# Patient Record
Sex: Female | Born: 1940
Health system: Southern US, Community
[De-identification: ages and names within clinical notes are randomized; demographics above are authoritative.]

## PROBLEM LIST (undated history)

## (undated) DIAGNOSIS — E7800 Pure hypercholesterolemia, unspecified: Secondary | ICD-10-CM

## (undated) DIAGNOSIS — M1990 Unspecified osteoarthritis, unspecified site: Secondary | ICD-10-CM

## (undated) DIAGNOSIS — K219 Gastro-esophageal reflux disease without esophagitis: Secondary | ICD-10-CM

## (undated) DIAGNOSIS — Z860100 Personal history of colon polyps, unspecified: Secondary | ICD-10-CM

## (undated) DIAGNOSIS — I10 Essential (primary) hypertension: Secondary | ICD-10-CM

## (undated) DIAGNOSIS — H269 Unspecified cataract: Secondary | ICD-10-CM

## (undated) DIAGNOSIS — M810 Age-related osteoporosis without current pathological fracture: Secondary | ICD-10-CM

## (undated) HISTORY — DX: Unspecified osteoarthritis, unspecified site: M19.90

## (undated) HISTORY — DX: Essential (primary) hypertension: I10

## (undated) HISTORY — DX: Age-related osteoporosis without current pathological fracture: M81.0

## (undated) HISTORY — PX: KNEE ARTHROPLASTY: SHX992

## (undated) HISTORY — DX: Gastro-esophageal reflux disease without esophagitis: K21.9

## (undated) HISTORY — PX: JOINT REPLACEMENT: SHX530

## (undated) HISTORY — DX: Pure hypercholesterolemia, unspecified: E78.00

## (undated) HISTORY — PX: GUM SURGERY: SHX658

## (undated) HISTORY — DX: Personal history of colonic polyps: Z86.010

## (undated) HISTORY — DX: Unspecified cataract: H26.9

## (undated) HISTORY — PX: TUBAL LIGATION: SHX77

## (undated) HISTORY — DX: Personal history of colon polyps, unspecified: Z86.0100

## (undated) HISTORY — PX: CARPAL TUNNEL RELEASE: SHX101

---

## 2004-09-17 ENCOUNTER — Ambulatory Visit (HOSPITAL_COMMUNITY): Admission: RE | Admit: 2004-09-17 | Discharge: 2004-09-17 | Payer: Self-pay | Admitting: Rheumatology

## 2006-07-03 IMAGING — CR DG CHEST 2V
2 series · 2 of 2 positions shown · non-contrast
Comparison: none

CLINICAL DATA: Rheumatoid arthritis.  Question tuberculosis.  
 CHEST - TWO VIEW:
 There are no prior studies available for comparison.   There are no infiltrative or edematous changes.  Minimal linear scarring is seen within the lung bases.  The heart is upper normal in size and there are no mediastinal abnormalities.

[view not recorded (1 of 2)]
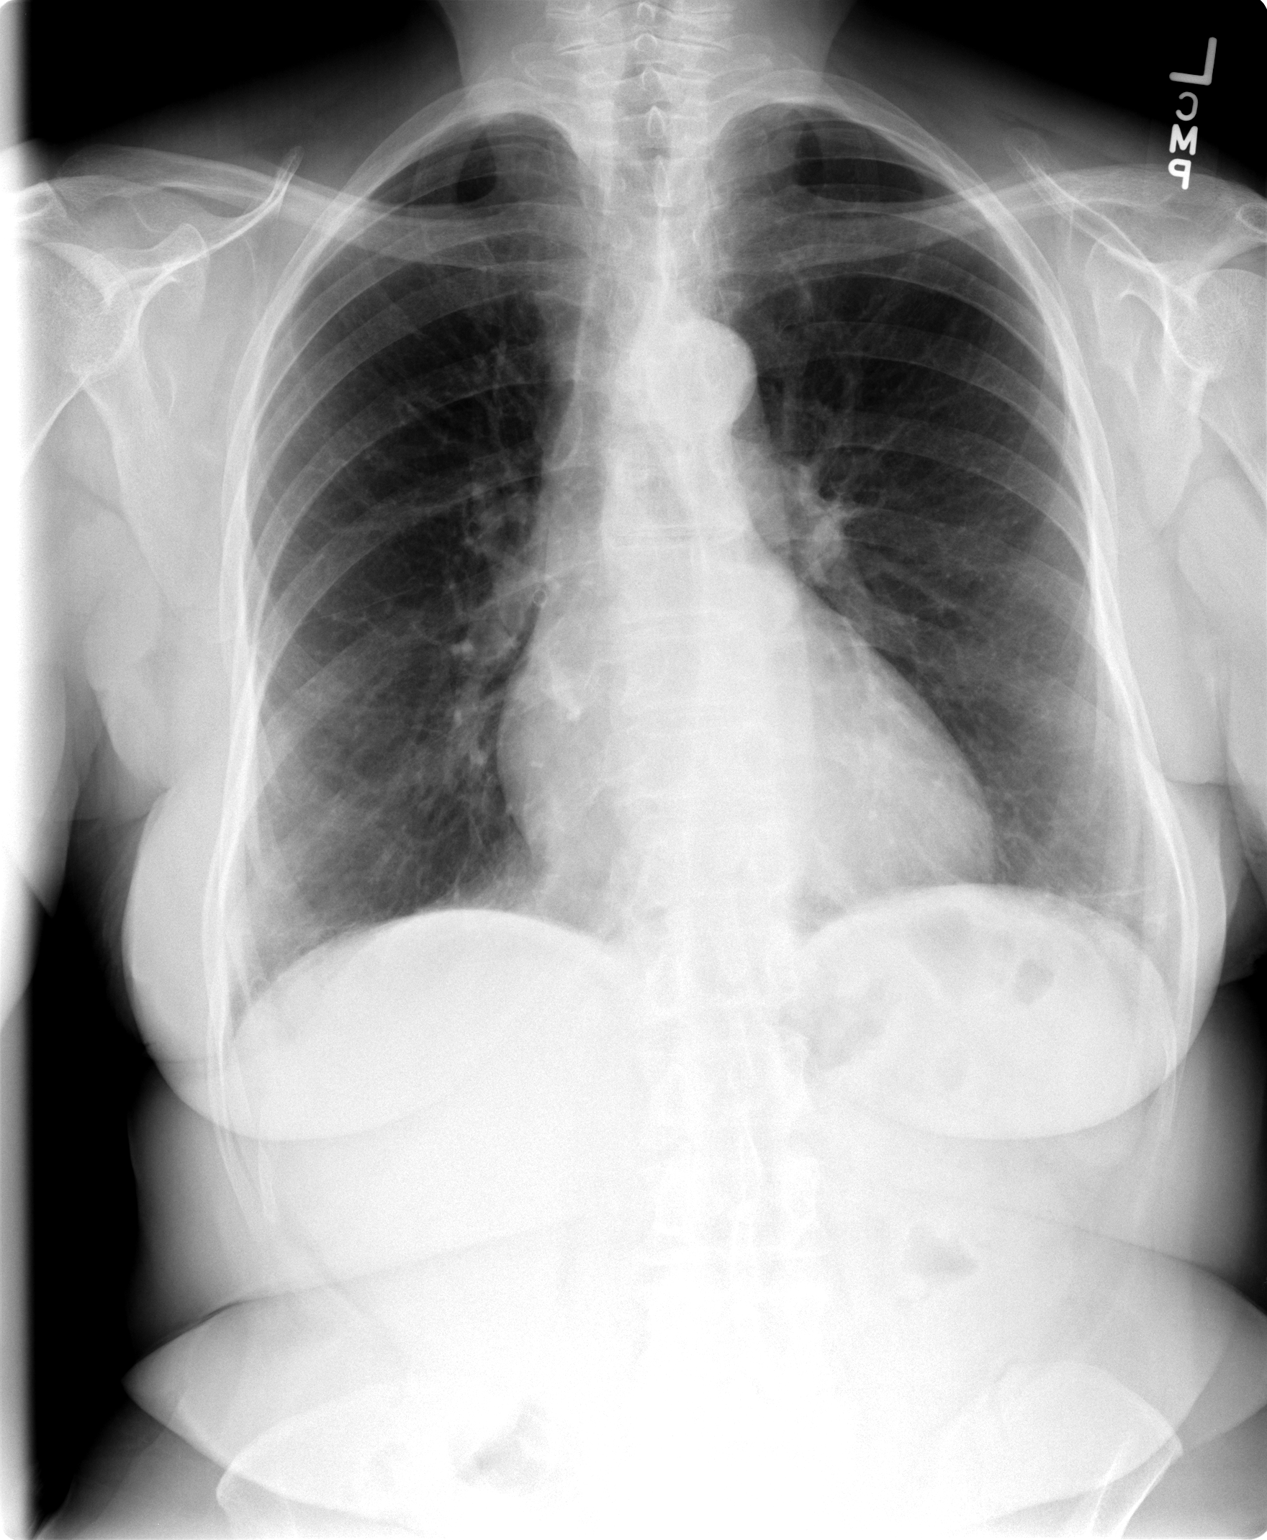

[view not recorded (2 of 2)]
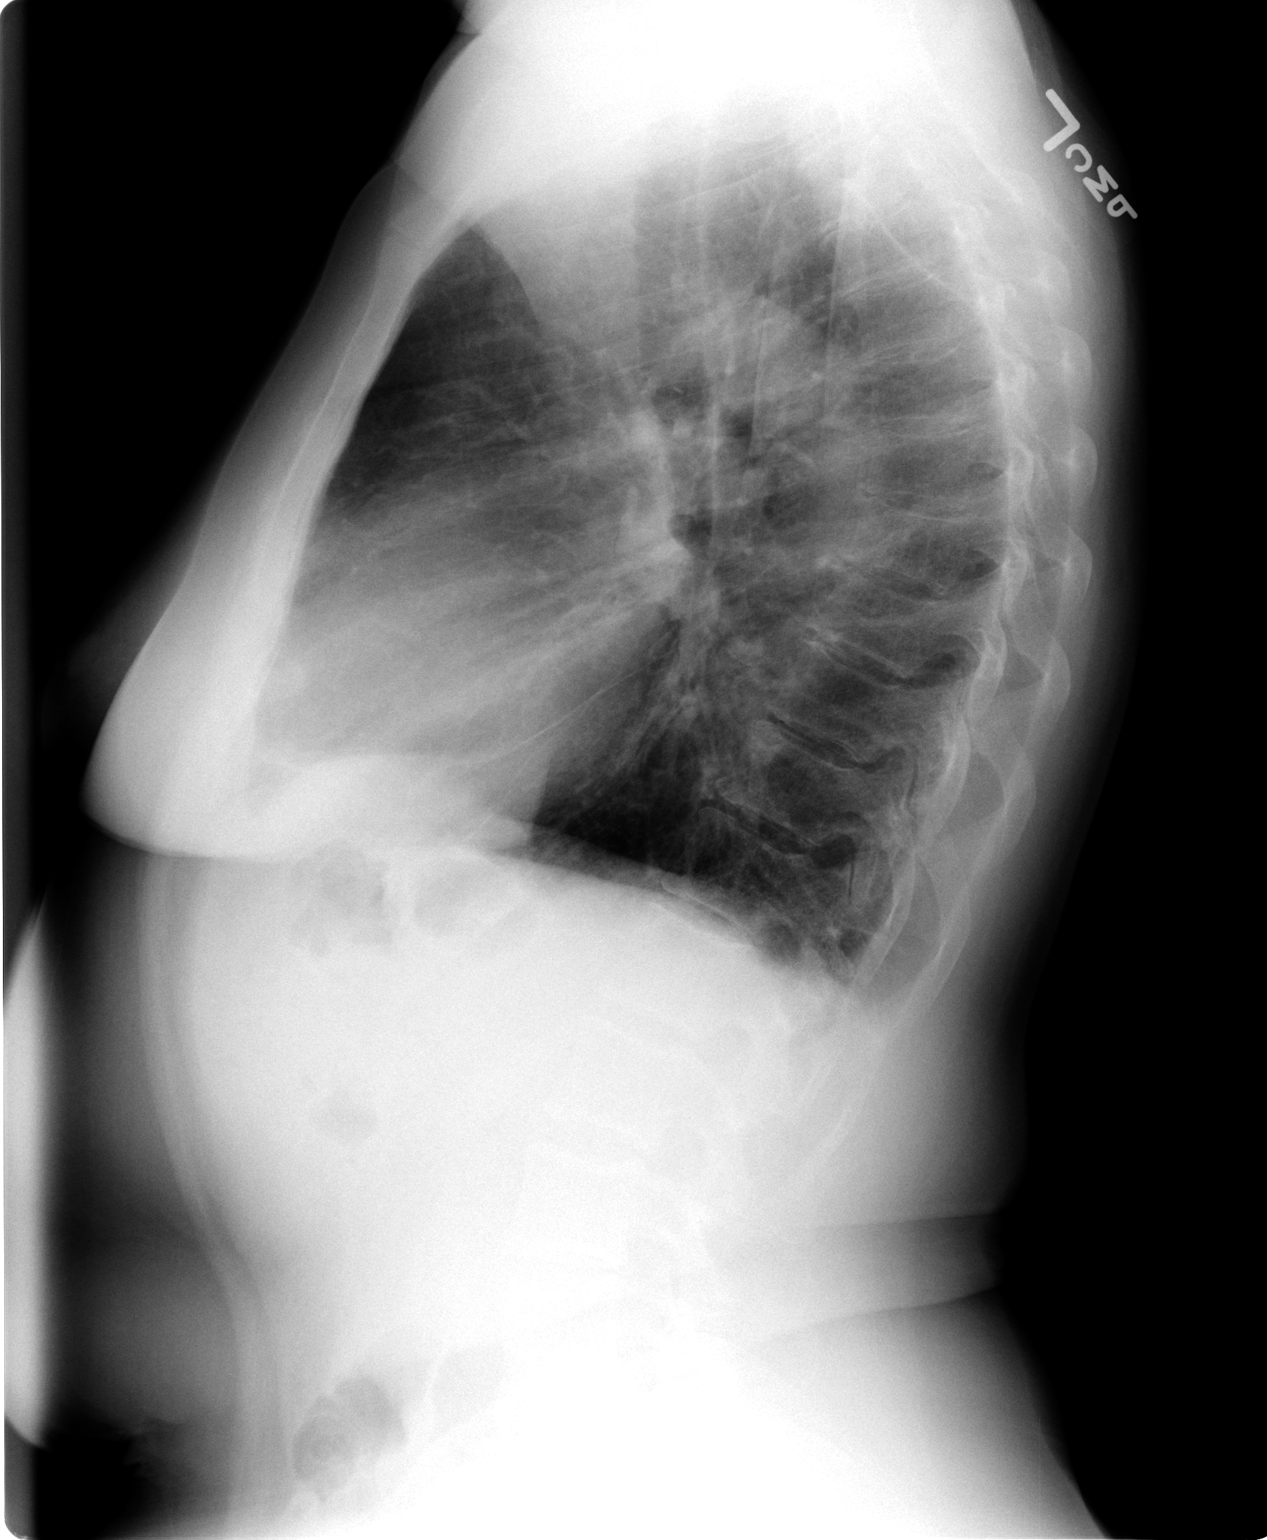

[2 of 2 positions shown; findings below may reference images not displayed]

IMPRESSION: No evidence for active chest disease.

## 2008-03-18 ENCOUNTER — Inpatient Hospital Stay (HOSPITAL_COMMUNITY): Admission: RE | Admit: 2008-03-18 | Discharge: 2008-03-20 | Payer: Self-pay | Admitting: Orthopedic Surgery

## 2010-12-23 HISTORY — PX: COLONOSCOPY: SHX174

## 2011-01-05 NOTE — Op Note (Signed)
NAMEHAYES, REHFELDT NO.:  [id number]   MEDICAL RECORD NO.:  [id number]          PATIENT TYPE:  INP   LOCATION:  4144                         FACILITY:  [facility]   PHYSICIAN:  [name]. Sherlean Foot, M.D. DATE OF BIRTH:  03-07-1941   DATE OF PROCEDURE:  03/18/2008  DATE OF DISCHARGE:                               OPERATIVE REPORT   SURGEON:  [name]. Sherlean Foot, M.D.   ASSISTANTS:  1. [name], PA-C  2. Skip Mayer, PA   ANESTHESIA:  General.   PREOPERATIVE DIAGNOSIS:  Right knee osteoarthritis.   POSTOPERATIVE DIAGNOSIS:  Right knee osteoarthritis.   PROCEDURE:  Right total knee arthroplasty.   INDICATIONS FOR PROCEDURE:  The patient is a 70 year old white female  with failed conservative measures for osteoarthritis of the right knee.  Informed consent was signed.   DESCRIPTION OF PROCEDURE:  The patient was taken to operating room,  administered general anesthesia.  Foley catheter was placed.  The right  leg was prepped and draped in usual sterile fashion.  A midline incision  was made with a #10 blade approximately 6 inches in length.  A new blade  was used to make a median parapatellar arthrotomy and performed a  synovectomy.  I then went into flexion.  I elevated deep MCL off the  medial crest of the tibia.  I used extramedullary alignment system to  make a perpendicular cut to the anatomic axis of the tibia.  Then used  __________ guide system making a 4-degree valgus cut on the distal femur  since it was a valgus knee.  I removed the intramedullary guide.  I  marked out the subcondylar axis and posterior condylar angle measured 3  degrees, sized to a size D.  I pinned the cutting block into 3 degrees  of external rotation.  I made the posterior and anterior chamfer cuts  with the sagittal saw.  I then removed the guide to cut surfaces of the  bone.  I placed a lamina spreader in the knee, removed the ACL, PCL,  medial lateral menisci, and posterior  condylar osteophytes.  I then  placed a 10-mm spacer block in the knee, and had good flexion/extension  gap balance.  Then placed a scoop retractor behind the femur and used  the D finishing block to make the __________, then removed that and  finished the femur with a size 4 tibial tray drilling keel.  I then  trialed with a size 4 tibia, D femur, 10 insert, and 35 patella with a  good flexion/extension gap balance __________ to perform a small  release.  I then removed the trial components and copiously irrigated.  I placed the components and removed all excess cement, allowed cement  harden in extension.  I left the Hemovac coming out superolaterally and  deep the arthrotomy and plain catheter commenced supermedial and  superficial to arthrotomy.  Once the cement was harden, I let the  tourniquet down, this was at 50 minutes.  I obtained hemostasis.  Wound  copiously irrigated.  I closed the arthrotomy with figure-of-eight #1  Vicryl  sutures and deep soft tissues buried with 0 Vicryl sutures,  subcuticular 2-0 Vicryl, and skin staples.  Dressed with Xeroform  dressing sponges, sterile Webril, and TED stocking.   COMPLICATIONS:  None.   DRAINS:  One Hemovac and one plain catheter.   ESTIMATED BLOOD LOSS:  300 mL.   TOURNIQUET TIME:  52 minutes.           ______________________________  [name] Sherlean Foot, M.D.     SDL/MEDQ  D:  03/18/2008  T:  03/19/2008  Job:  [id number]

## 2011-05-07 ENCOUNTER — Encounter (HOSPITAL_COMMUNITY)
Admission: RE | Admit: 2011-05-07 | Discharge: 2011-05-07 | Disposition: A | Discharge status: Home / Self Care | Payer: Medicare Other | Source: Ambulatory Visit | Attending: Orthopedic Surgery | Admitting: Orthopedic Surgery

## 2011-05-07 LAB — URINALYSIS, ROUTINE W REFLEX MICROSCOPIC
Glucose, UA: NEGATIVE mg/dL
Hgb urine dipstick: NEGATIVE
Ketones, ur: NEGATIVE mg/dL
pH: 6 (ref 5.0–8.0)

## 2011-05-07 LAB — DIFFERENTIAL
Basophils Relative: 1 % (ref 0–1)
Eosinophils Absolute: 0.5 10*3/uL (ref 0.0–0.7)
Lymphs Abs: 1.7 10*3/uL (ref 0.7–4.0)
Monocytes Absolute: 0.5 10*3/uL (ref 0.1–1.0)
Monocytes Relative: 11 % (ref 3–12)
Neutrophils Relative %: 41 % — ABNORMAL LOW (ref 43–77)

## 2011-05-07 LAB — URINE MICROSCOPIC-ADD ON

## 2011-05-07 LAB — COMPREHENSIVE METABOLIC PANEL
ALT: 12 U/L (ref 0–35)
AST: 17 U/L (ref 0–37)
Albumin: 3.7 g/dL (ref 3.5–5.2)
Calcium: 9.8 mg/dL (ref 8.4–10.5)
Creatinine, Ser: 0.78 mg/dL (ref 0.50–1.10)
Sodium: 141 mEq/L (ref 135–145)

## 2011-05-07 LAB — PROTIME-INR: Prothrombin Time: 13.9 seconds (ref 11.6–15.2)

## 2011-05-07 LAB — CBC
MCH: 32.6 pg (ref 26.0–34.0)
MCHC: 35.1 g/dL (ref 30.0–36.0)
MCV: 92.8 fL (ref 78.0–100.0)
Platelets: 226 10*3/uL (ref 150–400)
RBC: 3.77 MIL/uL — ABNORMAL LOW (ref 3.87–5.11)

## 2011-05-07 LAB — SURGICAL PCR SCREEN: Staphylococcus aureus: NEGATIVE

## 2011-05-08 LAB — URINE CULTURE
Colony Count: 6000
Culture  Setup Time: 201209141134

## 2011-05-17 ENCOUNTER — Inpatient Hospital Stay (HOSPITAL_COMMUNITY)
Admission: RE | Admit: 2011-05-17 | Discharge: 2011-05-21 | DRG: 470 | Disposition: A | Discharge status: Skilled Nursing Facility | Payer: Medicare Other | Source: Ambulatory Visit | Attending: Orthopedic Surgery | Admitting: Orthopedic Surgery

## 2011-05-17 DIAGNOSIS — Z79899 Other long term (current) drug therapy: Secondary | ICD-10-CM

## 2011-05-17 DIAGNOSIS — D62 Acute posthemorrhagic anemia: Secondary | ICD-10-CM | POA: Diagnosis not present

## 2011-05-17 DIAGNOSIS — I10 Essential (primary) hypertension: Secondary | ICD-10-CM | POA: Diagnosis present

## 2011-05-17 DIAGNOSIS — M1710 Unilateral primary osteoarthritis, unspecified knee: Principal | ICD-10-CM | POA: Diagnosis present

## 2011-05-17 DIAGNOSIS — K219 Gastro-esophageal reflux disease without esophagitis: Secondary | ICD-10-CM | POA: Diagnosis present

## 2011-05-17 DIAGNOSIS — Z7901 Long term (current) use of anticoagulants: Secondary | ICD-10-CM

## 2011-05-17 DIAGNOSIS — Z01812 Encounter for preprocedural laboratory examination: Secondary | ICD-10-CM

## 2011-05-18 LAB — CBC
HCT: 25.3 % — ABNORMAL LOW (ref 36.0–46.0)
Hemoglobin: 8.7 g/dL — ABNORMAL LOW (ref 12.0–15.0)
MCV: 92.1 fL (ref 78.0–100.0)
MCV: 92.7 fL (ref 78.0–100.0)
Platelets: 195 10*3/uL (ref 150–400)
RBC: 2.77 MIL/uL — ABNORMAL LOW (ref 3.87–5.11)
RBC: 2.73 MIL/uL — ABNORMAL LOW (ref 3.87–5.11)
RDW: 13.5 % (ref 11.5–15.5)
WBC: 5.7 10*3/uL (ref 4.0–10.5)
WBC: 6.9 10*3/uL (ref 4.0–10.5)

## 2011-05-18 LAB — BASIC METABOLIC PANEL
CO2: 27 mEq/L (ref 19–32)
Chloride: 102 mEq/L (ref 96–112)
Creatinine, Ser: 0.82 mg/dL (ref 0.50–1.10)
Glucose, Bld: 111 mg/dL — ABNORMAL HIGH (ref 70–99)

## 2011-05-19 LAB — BASIC METABOLIC PANEL
BUN: 10 mg/dL (ref 6–23)
Calcium: 7.7 mg/dL — ABNORMAL LOW (ref 8.4–10.5)
GFR calc non Af Amer: >60 mL/min (ref >60)
Glucose, Bld: 94 mg/dL (ref 70–99)

## 2011-05-19 LAB — CBC
HCT: 20.9 % — ABNORMAL LOW (ref 36.0–46.0)
Hemoglobin: 7.1 g/dL — ABNORMAL LOW (ref 12.0–15.0)
MCH: 31.8 pg (ref 26.0–34.0)
MCHC: 34 g/dL (ref 30.0–36.0)

## 2011-05-20 LAB — BASIC METABOLIC PANEL
CO2: 27 mEq/L (ref 19–32)
Calcium: 8.2 mg/dL — ABNORMAL LOW (ref 8.4–10.5)
Creatinine, Ser: 0.6 mg/dL (ref 0.50–1.10)
GFR calc Af Amer: >60 mL/min (ref >60)

## 2011-05-20 LAB — CBC
MCH: 31.4 pg (ref 26.0–34.0)
MCV: 91.1 fL (ref 78.0–100.0)
Platelets: 163 10*3/uL (ref 150–400)
RDW: 14.7 % (ref 11.5–15.5)

## 2011-05-20 LAB — TYPE AND SCREEN
Antibody Screen: NEGATIVE
Unit division: 0

## 2011-05-21 LAB — URINALYSIS, ROUTINE W REFLEX MICROSCOPIC
Glucose, UA: NEGATIVE
Nitrite: NEGATIVE
Specific Gravity, Urine: 1.02
pH: 6

## 2011-05-21 LAB — CROSSMATCH

## 2011-05-21 LAB — BASIC METABOLIC PANEL
BUN: 7
BUN: 7
CO2: 31
Calcium: 8.8
Chloride: 100
Creatinine, Ser: 0.87
Creatinine, Ser: 0.92
GFR calc Af Amer: >60
Glucose, Bld: 144 — ABNORMAL HIGH

## 2011-05-21 LAB — CBC
MCHC: 33.5
MCHC: 33.8
MCV: 98
MCV: 98.2
Platelets: 184
Platelets: 192
Platelets: 227
RBC: 3.16 — ABNORMAL LOW
WBC: 5.5
WBC: 7.2

## 2011-05-21 LAB — DIFFERENTIAL
Basophils Absolute: 0
Eosinophils Relative: 9 — ABNORMAL HIGH
Lymphocytes Relative: 24
Lymphs Abs: 1.3
Monocytes Absolute: 0.4
Monocytes Relative: 8

## 2011-05-21 LAB — COMPREHENSIVE METABOLIC PANEL
AST: 17
Albumin: 3.7
Chloride: 103
Creatinine, Ser: 0.98
GFR calc Af Amer: >60
Total Bilirubin: 0.8
Total Protein: 6.8

## 2011-05-21 LAB — URINE MICROSCOPIC-ADD ON

## 2011-05-30 NOTE — Op Note (Signed)
NAMESHERLINE, EBERWEIN NO.:  [id number]  MEDICAL RECORD NO.:  [id number]  LOCATION:  5005                         FACILITY:  [facility]  PHYSICIAN:  [name]. Andrea [name], M.D. DATE OF BIRTH:  [birth year]  DATE OF PROCEDURE:  05/17/2011 DATE OF DISCHARGE:  05/21/2011                              OPERATIVE REPORT   SURGEON:  [name]. Andrea Foot, MD.  ASSISTANT:  [name], PA-C.  ANESTHESIA:  General.  PREOPERATIVE DIAGNOSIS:  Left knee osteoarthritis.  POSTOPERATIVE DIAGNOSIS:  Left knee osteoarthritis.  PROCEDURE:  Left total knee arthroplasty.  INDICATION FOR PROCEDURE:  The patient is a 70 year old black female with failure of conservative measures for osteoarthritis of the left knee.  Informed consent obtained.  DESCRIPTION OF PROCEDURE:  The patient was laid supine under general anesthesia.  Left leg prepped and draped in usual sterile fashion.  The extremity was exsanguinated with the Esmarch, tourniquet inflated to 350 mmHg.  A midline incision was made with a #10 blade.  New blade was used to make a median parapatellar arthrotomy and performed synovectomy.  I had elevated the deep MCL off the medial crest of the tibia, but not down through the pes since this was a valgus knee.  I then everted the patella, measured 22 mm thick.  I reamed down 8.5 mm and drilled through lug holes through the 32-mm template.  I then subluxed the patella laterally into flexion.  I used extramedullary alignment system on the tibia to make a perpendicular cut and 3 degrees posterior slope.  I used the intramedullary system set on 4 degrees since this was a valgus knee. I made the distal femoral cut.  I marked out the epicondylar axis, posterior condylar angle measured 3 degrees.  Then sized to a size D, pinned through 3 degrees external rotation holes, made the anterior, posterior, and chamfer cut with a sagittal saw.  I then placed a lamina spreader in the knee and removed  the ACL, PCL, medial and lateral menisci.  I removed the posterior condylar osteophytes as well.  I then placed a 12-mm spacer block in the knee.  I had good flexion/extension gap balance.  I then finished for a size D femur and a 4 tibia.  I then trialed with D femur, 4 tibia, 12 insert, 32 patella.  I had good flexion/extension gap balance, good patellar tracking and chose these components.  I removed the components, copiously irrigated.  I then cemented in the components, removed all excess cement, allowed cement to harden in extension.  Left Hemovac coming out superolaterally in deep arthrotomy, pain catheter coming out supermedially in the superficial layer of arthrotomy.  I then let down the tourniquet and obtained hemostasis at 46 minutes.  I then closed the arthrotomy with figure-of- eight #1 normal Vicryl sutures, buried 0 Vicryl sutures, subcuticular Dermabond, stitches and skin staples.  Dressed with Xeroform, dressing sponges, sterile Webril, and TED stocking.  COMPLICATIONS:  None.  DRAINS:  One Hemovac, one pain catheter.  ESTIMATED BLOOD LOSS:  300 mL.          ______________________________ [name]. Andrea [name], M.D.     SDL/MEDQ  D:  05/23/2011  T:  05/23/2011  Job:  [id number]  Electronically Signed by Georgena Spurling M.D. on 05/30/2011 11:22:55 AM

## 2011-06-22 NOTE — Discharge Summary (Signed)
[name], [name] NO.:  [id number]  MEDICAL RECORD NO.:  [id number]  LOCATION:  5005                         FACILITY:  [facility]  PHYSICIAN:  [name]. Sherlean Foot, M.D. DATE OF BIRTH:  28-Aug-1940  DATE OF ADMISSION:  05/17/2011 DATE OF DISCHARGE:                              DISCHARGE SUMMARY   ADMISSION DIAGNOSIS:  Osteoarthritis of the left knee.  DISCHARGE DIAGNOSES: 1. Osteoarthritis of the left knee. 2. Status post left total knee arthroplasty. 3. Acute blood loss anemia, status post surgery.  PROCEDURE:  Left total knee arthroplasty.  HISTORY:  The patient is a 70 year old female who complained of pain in the left knee times several years, now interfering with activities of daily living.  Conservative treatment has failed.  Risks and benefits of surgery were discussed with the patient.  The patient was cleared for surgery.  ALLERGIES:  The patient has no known drug allergies.MEDICATIONS:  Upon admission to the hospital the patient was taking; 1. Acetaminophen 500 mg 2 tablets every 4 hours as needed. 2. Active ultra-strength over-the-counter topically as needed. 3. Enbrel 50 mg subcutaneously weekly, she is held for the procedure. 4. Folic acid 1 mg 1 tablet by mouth twice daily held for procedure. 5. Fosamax 70 mg 1 tablet by mouth weekly. 6. Methotrexate 10 mg subcutaneously weekly. 7. Prilosec 20 mg daily 12% over-the-counter topically daily as     needed. 8. Triamterene/HCTZ 37.5/25 mg 1 tablet daily.  HOSPITAL COURSE:  This is a 70 year old female admitted on May 17, 2011, after appropriate laboratory studies were obtained preoperatively as well as Ancef when called to the operating room.  She was taken to the OR where she underwent a left total knee arthroplasty.  She tolerated the procedure well and was taken to the PACU in good condition.  She was placed on p.o. pain medication as well as Dilaudid IV as needed.  Foley was placed  intraoperatively.  Postop day #1, vital signs are stable.  The patient denied chest pain, shortness breath or calf pain, was started on Lovenox 30 mg subcu every 2 hours at 8:00 a.m.  Consults PT, OT and Care Management were made. The patient was weightbearing as tolerated.  CPM of 0-9 degrees for 6 to 8 hours a day.  Incentive spirometry was taught.  Postop day #2, the patient continued to progress with physical therapy. Dressing was changed.  Marcaine pump was discontinued as well as the Hemovac.  Foley was discontinued.  The patient was continued on p.o. pain medications.  The patient's blood level was low dropped down to 7.1 hemoglobin and was given 2 units of blood.  Postop day #3, the patient continued to progress with physical therapy, blood levels were better and the patient was transferred to SNF.  Wound bed was available.  DISCHARGE INSTRUCTIONS:  There are no restrictions to diet.  Follow the instruction sheet for wound care.  She may shower on Friday.  Increase activity slowly.  May use a cane or walker, weightbearing as tolerated. No lifting or driving for 6 weeks.  Home health status has been established post skilled nursing facility.  Upon discharge from hospital, the patient was given  prescriptions for; 1. Lovenox 40 mg inject 1 subcutaneously daily for 2 weeks. 2. Robaxin 500 mg 1-2 tabs every 6 to 8 hours as needed for spasm. 3. Oxycodone 5 mg 1 to 2 tablets every 4 to 6 hours as needed for     pain. 4. OxyContin 20 mg one twice a day.  The patient will follow with Dr. Sherlean Foot on June 01, 2011.  She is to call for an appointment to confirm at [phone], discharged in improved condition.  The patient's appointment will be in [facility] 8:20 on Tuesday morning.    ______________________________ [name], PA-C   ______________________________ [name]. Sherlean Foot, M.D.    MJ/MEDQ  D:  05/20/2011  T:  05/20/2011  Job:  [id number]  Electronically Signed by  [name] PA-C on 06/16/2011 01:54:59 PM Electronically Signed by Georgena Spurling M.D. on 06/22/2011 05:26:43 PM

## 2011-09-03 DIAGNOSIS — Z79899 Other long term (current) drug therapy: Secondary | ICD-10-CM | POA: Diagnosis not present

## 2011-09-03 DIAGNOSIS — M2550 Pain in unspecified joint: Secondary | ICD-10-CM | POA: Diagnosis not present

## 2011-09-09 DIAGNOSIS — M810 Age-related osteoporosis without current pathological fracture: Secondary | ICD-10-CM | POA: Diagnosis not present

## 2011-09-16 ENCOUNTER — Encounter (HOSPITAL_COMMUNITY)
Admission: RE | Admit: 2011-09-16 | Discharge: 2011-09-16 | Disposition: A | Discharge status: Home / Self Care | Payer: Medicare Other | Source: Ambulatory Visit | Attending: Rheumatology | Admitting: Rheumatology

## 2011-09-16 DIAGNOSIS — M810 Age-related osteoporosis without current pathological fracture: Secondary | ICD-10-CM | POA: Insufficient documentation

## 2011-09-16 MED ORDER — ZOLEDRONIC ACID 5 MG/100ML IV SOLN
5.0000 mg | Freq: Once | INTRAVENOUS | Status: AC
  Administered 2011-09-16: 5 mg via INTRAVENOUS
  Filled 2011-09-16: qty 100

## 2011-10-14 DIAGNOSIS — Z0000 Encounter for general adult medical examination without abnormal findings: Secondary | ICD-10-CM | POA: Diagnosis not present

## 2011-10-25 DIAGNOSIS — M25569 Pain in unspecified knee: Secondary | ICD-10-CM | POA: Diagnosis not present

## 2011-10-28 DIAGNOSIS — M810 Age-related osteoporosis without current pathological fracture: Secondary | ICD-10-CM | POA: Diagnosis not present

## 2011-10-28 DIAGNOSIS — Z7189 Other specified counseling: Secondary | ICD-10-CM | POA: Diagnosis not present

## 2011-10-28 DIAGNOSIS — Z1389 Encounter for screening for other disorder: Secondary | ICD-10-CM | POA: Diagnosis not present

## 2011-10-28 DIAGNOSIS — Z6825 Body mass index (BMI) 25.0-25.9, adult: Secondary | ICD-10-CM | POA: Diagnosis not present

## 2011-11-11 DIAGNOSIS — H35359 Cystoid macular degeneration, unspecified eye: Secondary | ICD-10-CM | POA: Diagnosis not present

## 2011-12-07 DIAGNOSIS — M25569 Pain in unspecified knee: Secondary | ICD-10-CM | POA: Diagnosis not present

## 2011-12-07 DIAGNOSIS — M1710 Unilateral primary osteoarthritis, unspecified knee: Secondary | ICD-10-CM | POA: Diagnosis not present

## 2011-12-07 DIAGNOSIS — M810 Age-related osteoporosis without current pathological fracture: Secondary | ICD-10-CM | POA: Diagnosis not present

## 2011-12-07 DIAGNOSIS — Z79899 Other long term (current) drug therapy: Secondary | ICD-10-CM | POA: Diagnosis not present

## 2011-12-07 DIAGNOSIS — M069 Rheumatoid arthritis, unspecified: Secondary | ICD-10-CM | POA: Diagnosis not present

## 2011-12-07 DIAGNOSIS — R7612 Nonspecific reaction to cell mediated immunity measurement of gamma interferon antigen response without active tuberculosis: Secondary | ICD-10-CM | POA: Diagnosis not present

## 2012-01-25 DIAGNOSIS — K219 Gastro-esophageal reflux disease without esophagitis: Secondary | ICD-10-CM | POA: Diagnosis not present

## 2012-03-03 DIAGNOSIS — Z79899 Other long term (current) drug therapy: Secondary | ICD-10-CM | POA: Diagnosis not present

## 2012-03-22 DIAGNOSIS — IMO0001 Reserved for inherently not codable concepts without codable children: Secondary | ICD-10-CM | POA: Diagnosis not present

## 2012-03-22 DIAGNOSIS — M5137 Other intervertebral disc degeneration, lumbosacral region: Secondary | ICD-10-CM | POA: Diagnosis not present

## 2012-03-22 DIAGNOSIS — M999 Biomechanical lesion, unspecified: Secondary | ICD-10-CM | POA: Diagnosis not present

## 2012-03-27 DIAGNOSIS — M999 Biomechanical lesion, unspecified: Secondary | ICD-10-CM | POA: Diagnosis not present

## 2012-03-27 DIAGNOSIS — IMO0001 Reserved for inherently not codable concepts without codable children: Secondary | ICD-10-CM | POA: Diagnosis not present

## 2012-03-27 DIAGNOSIS — M5137 Other intervertebral disc degeneration, lumbosacral region: Secondary | ICD-10-CM | POA: Diagnosis not present

## 2012-03-29 DIAGNOSIS — IMO0001 Reserved for inherently not codable concepts without codable children: Secondary | ICD-10-CM | POA: Diagnosis not present

## 2012-03-29 DIAGNOSIS — M999 Biomechanical lesion, unspecified: Secondary | ICD-10-CM | POA: Diagnosis not present

## 2012-03-29 DIAGNOSIS — M5137 Other intervertebral disc degeneration, lumbosacral region: Secondary | ICD-10-CM | POA: Diagnosis not present

## 2012-03-31 DIAGNOSIS — M5137 Other intervertebral disc degeneration, lumbosacral region: Secondary | ICD-10-CM | POA: Diagnosis not present

## 2012-03-31 DIAGNOSIS — M999 Biomechanical lesion, unspecified: Secondary | ICD-10-CM | POA: Diagnosis not present

## 2012-03-31 DIAGNOSIS — IMO0001 Reserved for inherently not codable concepts without codable children: Secondary | ICD-10-CM | POA: Diagnosis not present

## 2012-04-03 DIAGNOSIS — M5137 Other intervertebral disc degeneration, lumbosacral region: Secondary | ICD-10-CM | POA: Diagnosis not present

## 2012-04-03 DIAGNOSIS — IMO0001 Reserved for inherently not codable concepts without codable children: Secondary | ICD-10-CM | POA: Diagnosis not present

## 2012-04-03 DIAGNOSIS — M999 Biomechanical lesion, unspecified: Secondary | ICD-10-CM | POA: Diagnosis not present

## 2012-04-05 DIAGNOSIS — M999 Biomechanical lesion, unspecified: Secondary | ICD-10-CM | POA: Diagnosis not present

## 2012-04-05 DIAGNOSIS — M5137 Other intervertebral disc degeneration, lumbosacral region: Secondary | ICD-10-CM | POA: Diagnosis not present

## 2012-04-05 DIAGNOSIS — IMO0001 Reserved for inherently not codable concepts without codable children: Secondary | ICD-10-CM | POA: Diagnosis not present

## 2012-04-07 DIAGNOSIS — M999 Biomechanical lesion, unspecified: Secondary | ICD-10-CM | POA: Diagnosis not present

## 2012-04-07 DIAGNOSIS — IMO0001 Reserved for inherently not codable concepts without codable children: Secondary | ICD-10-CM | POA: Diagnosis not present

## 2012-04-07 DIAGNOSIS — M5137 Other intervertebral disc degeneration, lumbosacral region: Secondary | ICD-10-CM | POA: Diagnosis not present

## 2012-04-10 DIAGNOSIS — M999 Biomechanical lesion, unspecified: Secondary | ICD-10-CM | POA: Diagnosis not present

## 2012-04-10 DIAGNOSIS — M5137 Other intervertebral disc degeneration, lumbosacral region: Secondary | ICD-10-CM | POA: Diagnosis not present

## 2012-04-10 DIAGNOSIS — M1710 Unilateral primary osteoarthritis, unspecified knee: Secondary | ICD-10-CM | POA: Diagnosis not present

## 2012-04-10 DIAGNOSIS — IMO0001 Reserved for inherently not codable concepts without codable children: Secondary | ICD-10-CM | POA: Diagnosis not present

## 2012-04-13 DIAGNOSIS — M999 Biomechanical lesion, unspecified: Secondary | ICD-10-CM | POA: Diagnosis not present

## 2012-04-13 DIAGNOSIS — M5137 Other intervertebral disc degeneration, lumbosacral region: Secondary | ICD-10-CM | POA: Diagnosis not present

## 2012-04-13 DIAGNOSIS — IMO0001 Reserved for inherently not codable concepts without codable children: Secondary | ICD-10-CM | POA: Diagnosis not present

## 2012-04-17 DIAGNOSIS — IMO0001 Reserved for inherently not codable concepts without codable children: Secondary | ICD-10-CM | POA: Diagnosis not present

## 2012-04-17 DIAGNOSIS — M999 Biomechanical lesion, unspecified: Secondary | ICD-10-CM | POA: Diagnosis not present

## 2012-04-17 DIAGNOSIS — M5137 Other intervertebral disc degeneration, lumbosacral region: Secondary | ICD-10-CM | POA: Diagnosis not present

## 2012-04-19 DIAGNOSIS — M999 Biomechanical lesion, unspecified: Secondary | ICD-10-CM | POA: Diagnosis not present

## 2012-04-19 DIAGNOSIS — M5137 Other intervertebral disc degeneration, lumbosacral region: Secondary | ICD-10-CM | POA: Diagnosis not present

## 2012-04-19 DIAGNOSIS — IMO0001 Reserved for inherently not codable concepts without codable children: Secondary | ICD-10-CM | POA: Diagnosis not present

## 2012-04-25 DIAGNOSIS — Z1231 Encounter for screening mammogram for malignant neoplasm of breast: Secondary | ICD-10-CM | POA: Diagnosis not present

## 2012-04-28 DIAGNOSIS — R928 Other abnormal and inconclusive findings on diagnostic imaging of breast: Secondary | ICD-10-CM | POA: Diagnosis not present

## 2012-05-09 DIAGNOSIS — Z79899 Other long term (current) drug therapy: Secondary | ICD-10-CM | POA: Diagnosis not present

## 2012-05-09 DIAGNOSIS — M2550 Pain in unspecified joint: Secondary | ICD-10-CM | POA: Diagnosis not present

## 2012-05-11 DIAGNOSIS — M069 Rheumatoid arthritis, unspecified: Secondary | ICD-10-CM | POA: Diagnosis not present

## 2012-06-13 DIAGNOSIS — N643 Galactorrhea not associated with childbirth: Secondary | ICD-10-CM | POA: Diagnosis not present

## 2012-06-13 DIAGNOSIS — Z23 Encounter for immunization: Secondary | ICD-10-CM | POA: Diagnosis not present

## 2012-06-27 DIAGNOSIS — M546 Pain in thoracic spine: Secondary | ICD-10-CM | POA: Diagnosis not present

## 2012-06-27 DIAGNOSIS — M6281 Muscle weakness (generalized): Secondary | ICD-10-CM | POA: Diagnosis not present

## 2012-06-27 DIAGNOSIS — S239XXA Sprain of unspecified parts of thorax, initial encounter: Secondary | ICD-10-CM | POA: Diagnosis not present

## 2012-06-29 DIAGNOSIS — M546 Pain in thoracic spine: Secondary | ICD-10-CM | POA: Diagnosis not present

## 2012-06-29 DIAGNOSIS — S239XXA Sprain of unspecified parts of thorax, initial encounter: Secondary | ICD-10-CM | POA: Diagnosis not present

## 2012-06-29 DIAGNOSIS — M6281 Muscle weakness (generalized): Secondary | ICD-10-CM | POA: Diagnosis not present

## 2012-07-04 DIAGNOSIS — M546 Pain in thoracic spine: Secondary | ICD-10-CM | POA: Diagnosis not present

## 2012-07-04 DIAGNOSIS — S239XXA Sprain of unspecified parts of thorax, initial encounter: Secondary | ICD-10-CM | POA: Diagnosis not present

## 2012-07-04 DIAGNOSIS — M6281 Muscle weakness (generalized): Secondary | ICD-10-CM | POA: Diagnosis not present

## 2012-07-06 DIAGNOSIS — S239XXA Sprain of unspecified parts of thorax, initial encounter: Secondary | ICD-10-CM | POA: Diagnosis not present

## 2012-07-06 DIAGNOSIS — M6281 Muscle weakness (generalized): Secondary | ICD-10-CM | POA: Diagnosis not present

## 2012-07-06 DIAGNOSIS — M546 Pain in thoracic spine: Secondary | ICD-10-CM | POA: Diagnosis not present

## 2012-07-11 DIAGNOSIS — M546 Pain in thoracic spine: Secondary | ICD-10-CM | POA: Diagnosis not present

## 2012-07-11 DIAGNOSIS — M6281 Muscle weakness (generalized): Secondary | ICD-10-CM | POA: Diagnosis not present

## 2012-07-11 DIAGNOSIS — S239XXA Sprain of unspecified parts of thorax, initial encounter: Secondary | ICD-10-CM | POA: Diagnosis not present

## 2012-07-13 DIAGNOSIS — S239XXA Sprain of unspecified parts of thorax, initial encounter: Secondary | ICD-10-CM | POA: Diagnosis not present

## 2012-07-13 DIAGNOSIS — M546 Pain in thoracic spine: Secondary | ICD-10-CM | POA: Diagnosis not present

## 2012-07-13 DIAGNOSIS — M6281 Muscle weakness (generalized): Secondary | ICD-10-CM | POA: Diagnosis not present

## 2012-07-19 DIAGNOSIS — M6281 Muscle weakness (generalized): Secondary | ICD-10-CM | POA: Diagnosis not present

## 2012-07-19 DIAGNOSIS — S239XXA Sprain of unspecified parts of thorax, initial encounter: Secondary | ICD-10-CM | POA: Diagnosis not present

## 2012-07-19 DIAGNOSIS — M546 Pain in thoracic spine: Secondary | ICD-10-CM | POA: Diagnosis not present

## 2012-08-02 DIAGNOSIS — M810 Age-related osteoporosis without current pathological fracture: Secondary | ICD-10-CM | POA: Diagnosis not present

## 2012-08-30 DIAGNOSIS — Z79899 Other long term (current) drug therapy: Secondary | ICD-10-CM | POA: Diagnosis not present

## 2012-10-11 DIAGNOSIS — Z09 Encounter for follow-up examination after completed treatment for conditions other than malignant neoplasm: Secondary | ICD-10-CM | POA: Diagnosis not present

## 2012-10-11 DIAGNOSIS — Z96659 Presence of unspecified artificial knee joint: Secondary | ICD-10-CM | POA: Diagnosis not present

## 2012-10-11 DIAGNOSIS — M069 Rheumatoid arthritis, unspecified: Secondary | ICD-10-CM | POA: Diagnosis not present

## 2012-10-11 DIAGNOSIS — M810 Age-related osteoporosis without current pathological fracture: Secondary | ICD-10-CM | POA: Diagnosis not present

## 2012-11-08 DIAGNOSIS — Z79899 Other long term (current) drug therapy: Secondary | ICD-10-CM | POA: Diagnosis not present

## 2012-11-08 DIAGNOSIS — Z09 Encounter for follow-up examination after completed treatment for conditions other than malignant neoplasm: Secondary | ICD-10-CM | POA: Diagnosis not present

## 2012-11-08 DIAGNOSIS — R920 Mammographic microcalcification found on diagnostic imaging of breast: Secondary | ICD-10-CM | POA: Diagnosis not present

## 2012-11-08 DIAGNOSIS — M069 Rheumatoid arthritis, unspecified: Secondary | ICD-10-CM | POA: Diagnosis not present

## 2012-11-14 DIAGNOSIS — Z79899 Other long term (current) drug therapy: Secondary | ICD-10-CM | POA: Diagnosis not present

## 2012-11-27 ENCOUNTER — Other Ambulatory Visit (HOSPITAL_COMMUNITY): Payer: Self-pay | Admitting: *Deleted

## 2012-11-28 ENCOUNTER — Ambulatory Visit (HOSPITAL_COMMUNITY)
Admission: RE | Admit: 2012-11-28 | Discharge: 2012-11-28 | Disposition: A | Discharge status: Home / Self Care | Payer: Medicare Other | Source: Ambulatory Visit | Attending: Rheumatology | Admitting: Rheumatology

## 2012-11-28 DIAGNOSIS — M810 Age-related osteoporosis without current pathological fracture: Secondary | ICD-10-CM | POA: Insufficient documentation

## 2012-11-28 MED ORDER — ZOLEDRONIC ACID 5 MG/100ML IV SOLN: 5.0000 mg | Freq: Once | INTRAVENOUS | Status: DC

## 2012-11-28 MED ORDER — ZOLEDRONIC ACID 5 MG/100ML IV SOLN
INTRAVENOUS | Status: AC
  Administered 2012-11-28: 5 mg
  Filled 2012-11-28: qty 100

## 2013-01-10 DIAGNOSIS — G4700 Insomnia, unspecified: Secondary | ICD-10-CM | POA: Diagnosis not present

## 2013-01-10 DIAGNOSIS — I10 Essential (primary) hypertension: Secondary | ICD-10-CM | POA: Diagnosis not present

## 2013-02-08 DIAGNOSIS — Z79899 Other long term (current) drug therapy: Secondary | ICD-10-CM | POA: Diagnosis not present

## 2013-02-08 DIAGNOSIS — Z111 Encounter for screening for respiratory tuberculosis: Secondary | ICD-10-CM | POA: Diagnosis not present

## 2013-02-14 DIAGNOSIS — Z1331 Encounter for screening for depression: Secondary | ICD-10-CM | POA: Diagnosis not present

## 2013-02-14 DIAGNOSIS — Z01419 Encounter for gynecological examination (general) (routine) without abnormal findings: Secondary | ICD-10-CM | POA: Diagnosis not present

## 2013-02-14 DIAGNOSIS — E7800 Pure hypercholesterolemia, unspecified: Secondary | ICD-10-CM | POA: Diagnosis not present

## 2013-02-14 DIAGNOSIS — Z1210 Encounter for screening for malignant neoplasm of intestinal tract, unspecified: Secondary | ICD-10-CM | POA: Diagnosis not present

## 2013-02-14 DIAGNOSIS — R7301 Impaired fasting glucose: Secondary | ICD-10-CM | POA: Diagnosis not present

## 2013-03-16 DIAGNOSIS — Z09 Encounter for follow-up examination after completed treatment for conditions other than malignant neoplasm: Secondary | ICD-10-CM | POA: Diagnosis not present

## 2013-03-16 DIAGNOSIS — M25549 Pain in joints of unspecified hand: Secondary | ICD-10-CM | POA: Diagnosis not present

## 2013-03-16 DIAGNOSIS — M25579 Pain in unspecified ankle and joints of unspecified foot: Secondary | ICD-10-CM | POA: Diagnosis not present

## 2013-04-03 DIAGNOSIS — K219 Gastro-esophageal reflux disease without esophagitis: Secondary | ICD-10-CM | POA: Diagnosis not present

## 2013-05-09 DIAGNOSIS — Z79899 Other long term (current) drug therapy: Secondary | ICD-10-CM | POA: Diagnosis not present

## 2013-05-09 DIAGNOSIS — M069 Rheumatoid arthritis, unspecified: Secondary | ICD-10-CM | POA: Diagnosis not present

## 2013-05-09 DIAGNOSIS — K219 Gastro-esophageal reflux disease without esophagitis: Secondary | ICD-10-CM | POA: Diagnosis not present

## 2013-05-09 DIAGNOSIS — R12 Heartburn: Secondary | ICD-10-CM | POA: Diagnosis not present

## 2013-05-09 DIAGNOSIS — I10 Essential (primary) hypertension: Secondary | ICD-10-CM | POA: Diagnosis not present

## 2013-05-09 DIAGNOSIS — K449 Diaphragmatic hernia without obstruction or gangrene: Secondary | ICD-10-CM | POA: Diagnosis not present

## 2013-05-09 DIAGNOSIS — K2289 Other specified disease of esophagus: Secondary | ICD-10-CM | POA: Diagnosis not present

## 2013-05-09 DIAGNOSIS — E7800 Pure hypercholesterolemia, unspecified: Secondary | ICD-10-CM | POA: Diagnosis not present

## 2013-05-09 DIAGNOSIS — K2970 Gastritis, unspecified, without bleeding: Secondary | ICD-10-CM | POA: Diagnosis not present

## 2013-05-09 HISTORY — PX: ESOPHAGOGASTRODUODENOSCOPY: SHX1529

## 2013-05-15 DIAGNOSIS — Z09 Encounter for follow-up examination after completed treatment for conditions other than malignant neoplasm: Secondary | ICD-10-CM | POA: Diagnosis not present

## 2013-05-15 DIAGNOSIS — R928 Other abnormal and inconclusive findings on diagnostic imaging of breast: Secondary | ICD-10-CM | POA: Diagnosis not present

## 2013-05-18 DIAGNOSIS — Z79899 Other long term (current) drug therapy: Secondary | ICD-10-CM | POA: Diagnosis not present

## 2013-05-18 DIAGNOSIS — M069 Rheumatoid arthritis, unspecified: Secondary | ICD-10-CM | POA: Diagnosis not present

## 2013-07-06 DIAGNOSIS — Z139 Encounter for screening, unspecified: Secondary | ICD-10-CM | POA: Diagnosis not present

## 2013-07-06 DIAGNOSIS — R29818 Other symptoms and signs involving the nervous system: Secondary | ICD-10-CM | POA: Diagnosis not present

## 2013-07-06 DIAGNOSIS — Z23 Encounter for immunization: Secondary | ICD-10-CM | POA: Diagnosis not present

## 2013-07-06 DIAGNOSIS — Z1389 Encounter for screening for other disorder: Secondary | ICD-10-CM | POA: Diagnosis not present

## 2013-07-06 DIAGNOSIS — Z6828 Body mass index (BMI) 28.0-28.9, adult: Secondary | ICD-10-CM | POA: Diagnosis not present

## 2013-07-06 DIAGNOSIS — I129 Hypertensive chronic kidney disease with stage 1 through stage 4 chronic kidney disease, or unspecified chronic kidney disease: Secondary | ICD-10-CM | POA: Diagnosis not present

## 2013-07-12 DIAGNOSIS — R29818 Other symptoms and signs involving the nervous system: Secondary | ICD-10-CM | POA: Diagnosis not present

## 2013-07-12 DIAGNOSIS — M6281 Muscle weakness (generalized): Secondary | ICD-10-CM | POA: Diagnosis not present

## 2013-07-12 DIAGNOSIS — R262 Difficulty in walking, not elsewhere classified: Secondary | ICD-10-CM | POA: Diagnosis not present

## 2013-07-13 DIAGNOSIS — M6281 Muscle weakness (generalized): Secondary | ICD-10-CM | POA: Diagnosis not present

## 2013-07-13 DIAGNOSIS — R29818 Other symptoms and signs involving the nervous system: Secondary | ICD-10-CM | POA: Diagnosis not present

## 2013-07-13 DIAGNOSIS — R262 Difficulty in walking, not elsewhere classified: Secondary | ICD-10-CM | POA: Diagnosis not present

## 2013-07-16 DIAGNOSIS — M6281 Muscle weakness (generalized): Secondary | ICD-10-CM | POA: Diagnosis not present

## 2013-07-16 DIAGNOSIS — R262 Difficulty in walking, not elsewhere classified: Secondary | ICD-10-CM | POA: Diagnosis not present

## 2013-07-16 DIAGNOSIS — R29818 Other symptoms and signs involving the nervous system: Secondary | ICD-10-CM | POA: Diagnosis not present

## 2013-07-24 DIAGNOSIS — R29818 Other symptoms and signs involving the nervous system: Secondary | ICD-10-CM | POA: Diagnosis not present

## 2013-07-24 DIAGNOSIS — R262 Difficulty in walking, not elsewhere classified: Secondary | ICD-10-CM | POA: Diagnosis not present

## 2013-07-24 DIAGNOSIS — M6281 Muscle weakness (generalized): Secondary | ICD-10-CM | POA: Diagnosis not present

## 2013-07-26 DIAGNOSIS — M6281 Muscle weakness (generalized): Secondary | ICD-10-CM | POA: Diagnosis not present

## 2013-07-26 DIAGNOSIS — R262 Difficulty in walking, not elsewhere classified: Secondary | ICD-10-CM | POA: Diagnosis not present

## 2013-07-26 DIAGNOSIS — R29818 Other symptoms and signs involving the nervous system: Secondary | ICD-10-CM | POA: Diagnosis not present

## 2013-08-09 DIAGNOSIS — Z79899 Other long term (current) drug therapy: Secondary | ICD-10-CM | POA: Diagnosis not present

## 2013-08-13 DIAGNOSIS — I10 Essential (primary) hypertension: Secondary | ICD-10-CM | POA: Diagnosis not present

## 2013-08-13 DIAGNOSIS — R29818 Other symptoms and signs involving the nervous system: Secondary | ICD-10-CM | POA: Diagnosis not present

## 2013-10-30 DIAGNOSIS — Z79899 Other long term (current) drug therapy: Secondary | ICD-10-CM | POA: Diagnosis not present

## 2013-11-07 DIAGNOSIS — R928 Other abnormal and inconclusive findings on diagnostic imaging of breast: Secondary | ICD-10-CM | POA: Diagnosis not present

## 2013-12-12 DIAGNOSIS — N183 Chronic kidney disease, stage 3 unspecified: Secondary | ICD-10-CM | POA: Diagnosis not present

## 2013-12-12 DIAGNOSIS — I129 Hypertensive chronic kidney disease with stage 1 through stage 4 chronic kidney disease, or unspecified chronic kidney disease: Secondary | ICD-10-CM | POA: Diagnosis not present

## 2013-12-12 DIAGNOSIS — E7800 Pure hypercholesterolemia, unspecified: Secondary | ICD-10-CM | POA: Diagnosis not present

## 2013-12-19 DIAGNOSIS — I129 Hypertensive chronic kidney disease with stage 1 through stage 4 chronic kidney disease, or unspecified chronic kidney disease: Secondary | ICD-10-CM | POA: Diagnosis not present

## 2013-12-19 DIAGNOSIS — E7800 Pure hypercholesterolemia, unspecified: Secondary | ICD-10-CM | POA: Diagnosis not present

## 2013-12-19 DIAGNOSIS — M069 Rheumatoid arthritis, unspecified: Secondary | ICD-10-CM | POA: Diagnosis not present

## 2013-12-19 DIAGNOSIS — N183 Chronic kidney disease, stage 3 unspecified: Secondary | ICD-10-CM | POA: Diagnosis not present

## 2014-02-15 DIAGNOSIS — Z111 Encounter for screening for respiratory tuberculosis: Secondary | ICD-10-CM | POA: Diagnosis not present

## 2014-02-15 DIAGNOSIS — Z79899 Other long term (current) drug therapy: Secondary | ICD-10-CM | POA: Diagnosis not present

## 2014-02-18 DIAGNOSIS — M810 Age-related osteoporosis without current pathological fracture: Secondary | ICD-10-CM | POA: Diagnosis not present

## 2014-02-18 DIAGNOSIS — M25549 Pain in joints of unspecified hand: Secondary | ICD-10-CM | POA: Diagnosis not present

## 2014-02-18 DIAGNOSIS — M25579 Pain in unspecified ankle and joints of unspecified foot: Secondary | ICD-10-CM | POA: Diagnosis not present

## 2014-02-18 DIAGNOSIS — M069 Rheumatoid arthritis, unspecified: Secondary | ICD-10-CM | POA: Diagnosis not present

## 2014-03-27 DIAGNOSIS — I129 Hypertensive chronic kidney disease with stage 1 through stage 4 chronic kidney disease, or unspecified chronic kidney disease: Secondary | ICD-10-CM | POA: Diagnosis not present

## 2014-03-27 DIAGNOSIS — N183 Chronic kidney disease, stage 3 unspecified: Secondary | ICD-10-CM | POA: Diagnosis not present

## 2014-03-27 DIAGNOSIS — E7800 Pure hypercholesterolemia, unspecified: Secondary | ICD-10-CM | POA: Diagnosis not present

## 2014-04-03 DIAGNOSIS — Z0000 Encounter for general adult medical examination without abnormal findings: Secondary | ICD-10-CM | POA: Diagnosis not present

## 2014-04-03 DIAGNOSIS — I129 Hypertensive chronic kidney disease with stage 1 through stage 4 chronic kidney disease, or unspecified chronic kidney disease: Secondary | ICD-10-CM | POA: Diagnosis not present

## 2014-04-03 DIAGNOSIS — Z139 Encounter for screening, unspecified: Secondary | ICD-10-CM | POA: Diagnosis not present

## 2014-04-03 DIAGNOSIS — Z1331 Encounter for screening for depression: Secondary | ICD-10-CM | POA: Diagnosis not present

## 2014-04-03 DIAGNOSIS — E7800 Pure hypercholesterolemia, unspecified: Secondary | ICD-10-CM | POA: Diagnosis not present

## 2014-04-03 DIAGNOSIS — N183 Chronic kidney disease, stage 3 unspecified: Secondary | ICD-10-CM | POA: Diagnosis not present

## 2014-04-19 DIAGNOSIS — Z79899 Other long term (current) drug therapy: Secondary | ICD-10-CM | POA: Diagnosis not present

## 2014-06-11 DIAGNOSIS — Z79899 Other long term (current) drug therapy: Secondary | ICD-10-CM | POA: Diagnosis not present

## 2014-07-23 DIAGNOSIS — M069 Rheumatoid arthritis, unspecified: Secondary | ICD-10-CM | POA: Diagnosis not present

## 2014-07-23 DIAGNOSIS — M79641 Pain in right hand: Secondary | ICD-10-CM | POA: Diagnosis not present

## 2014-07-23 DIAGNOSIS — M810 Age-related osteoporosis without current pathological fracture: Secondary | ICD-10-CM | POA: Diagnosis not present

## 2014-07-23 DIAGNOSIS — M545 Low back pain: Secondary | ICD-10-CM | POA: Diagnosis not present

## 2014-07-23 DIAGNOSIS — M79642 Pain in left hand: Secondary | ICD-10-CM | POA: Diagnosis not present

## 2014-07-26 DIAGNOSIS — Z23 Encounter for immunization: Secondary | ICD-10-CM | POA: Diagnosis not present

## 2014-07-26 DIAGNOSIS — E782 Mixed hyperlipidemia: Secondary | ICD-10-CM | POA: Diagnosis not present

## 2014-08-01 DIAGNOSIS — M810 Age-related osteoporosis without current pathological fracture: Secondary | ICD-10-CM | POA: Diagnosis not present

## 2014-08-01 DIAGNOSIS — M069 Rheumatoid arthritis, unspecified: Secondary | ICD-10-CM | POA: Diagnosis not present

## 2014-08-08 DIAGNOSIS — M069 Rheumatoid arthritis, unspecified: Secondary | ICD-10-CM | POA: Diagnosis not present

## 2014-08-08 DIAGNOSIS — I129 Hypertensive chronic kidney disease with stage 1 through stage 4 chronic kidney disease, or unspecified chronic kidney disease: Secondary | ICD-10-CM | POA: Diagnosis not present

## 2014-08-08 DIAGNOSIS — N183 Chronic kidney disease, stage 3 (moderate): Secondary | ICD-10-CM | POA: Diagnosis not present

## 2014-08-08 DIAGNOSIS — Z6828 Body mass index (BMI) 28.0-28.9, adult: Secondary | ICD-10-CM | POA: Diagnosis not present

## 2014-09-12 DIAGNOSIS — K219 Gastro-esophageal reflux disease without esophagitis: Secondary | ICD-10-CM | POA: Diagnosis not present

## 2014-09-20 DIAGNOSIS — Z79899 Other long term (current) drug therapy: Secondary | ICD-10-CM | POA: Diagnosis not present

## 2014-11-26 DIAGNOSIS — R920 Mammographic microcalcification found on diagnostic imaging of breast: Secondary | ICD-10-CM | POA: Diagnosis not present

## 2014-12-07 DIAGNOSIS — Z79899 Other long term (current) drug therapy: Secondary | ICD-10-CM | POA: Diagnosis not present

## 2014-12-24 DIAGNOSIS — M810 Age-related osteoporosis without current pathological fracture: Secondary | ICD-10-CM | POA: Diagnosis not present

## 2014-12-24 DIAGNOSIS — M0579 Rheumatoid arthritis with rheumatoid factor of multiple sites without organ or systems involvement: Secondary | ICD-10-CM | POA: Diagnosis not present

## 2014-12-24 DIAGNOSIS — M25561 Pain in right knee: Secondary | ICD-10-CM | POA: Diagnosis not present

## 2014-12-24 DIAGNOSIS — M79641 Pain in right hand: Secondary | ICD-10-CM | POA: Diagnosis not present

## 2015-01-21 DIAGNOSIS — E782 Mixed hyperlipidemia: Secondary | ICD-10-CM | POA: Diagnosis not present

## 2015-01-21 DIAGNOSIS — M069 Rheumatoid arthritis, unspecified: Secondary | ICD-10-CM | POA: Diagnosis not present

## 2015-01-21 DIAGNOSIS — N183 Chronic kidney disease, stage 3 (moderate): Secondary | ICD-10-CM | POA: Diagnosis not present

## 2015-01-21 DIAGNOSIS — I129 Hypertensive chronic kidney disease with stage 1 through stage 4 chronic kidney disease, or unspecified chronic kidney disease: Secondary | ICD-10-CM | POA: Diagnosis not present

## 2015-01-23 DIAGNOSIS — E876 Hypokalemia: Secondary | ICD-10-CM | POA: Diagnosis not present

## 2015-03-14 DIAGNOSIS — Z111 Encounter for screening for respiratory tuberculosis: Secondary | ICD-10-CM | POA: Diagnosis not present

## 2015-03-14 DIAGNOSIS — E559 Vitamin D deficiency, unspecified: Secondary | ICD-10-CM | POA: Diagnosis not present

## 2015-03-14 DIAGNOSIS — Z79899 Other long term (current) drug therapy: Secondary | ICD-10-CM | POA: Diagnosis not present

## 2015-04-03 DIAGNOSIS — H18413 Arcus senilis, bilateral: Secondary | ICD-10-CM | POA: Diagnosis not present

## 2015-04-03 DIAGNOSIS — H35352 Cystoid macular degeneration, left eye: Secondary | ICD-10-CM | POA: Diagnosis not present

## 2015-04-30 DIAGNOSIS — Z1389 Encounter for screening for other disorder: Secondary | ICD-10-CM | POA: Diagnosis not present

## 2015-04-30 DIAGNOSIS — Z139 Encounter for screening, unspecified: Secondary | ICD-10-CM | POA: Diagnosis not present

## 2015-04-30 DIAGNOSIS — Z0000 Encounter for general adult medical examination without abnormal findings: Secondary | ICD-10-CM | POA: Diagnosis not present

## 2015-05-13 DIAGNOSIS — E782 Mixed hyperlipidemia: Secondary | ICD-10-CM | POA: Diagnosis not present

## 2015-05-13 DIAGNOSIS — I129 Hypertensive chronic kidney disease with stage 1 through stage 4 chronic kidney disease, or unspecified chronic kidney disease: Secondary | ICD-10-CM | POA: Diagnosis not present

## 2015-05-22 DIAGNOSIS — E785 Hyperlipidemia, unspecified: Secondary | ICD-10-CM | POA: Diagnosis not present

## 2015-05-22 DIAGNOSIS — M069 Rheumatoid arthritis, unspecified: Secondary | ICD-10-CM | POA: Diagnosis not present

## 2015-05-22 DIAGNOSIS — I129 Hypertensive chronic kidney disease with stage 1 through stage 4 chronic kidney disease, or unspecified chronic kidney disease: Secondary | ICD-10-CM | POA: Diagnosis not present

## 2015-05-22 DIAGNOSIS — N183 Chronic kidney disease, stage 3 (moderate): Secondary | ICD-10-CM | POA: Diagnosis not present

## 2015-05-26 DIAGNOSIS — M79641 Pain in right hand: Secondary | ICD-10-CM | POA: Diagnosis not present

## 2015-05-26 DIAGNOSIS — M25561 Pain in right knee: Secondary | ICD-10-CM | POA: Diagnosis not present

## 2015-05-26 DIAGNOSIS — E559 Vitamin D deficiency, unspecified: Secondary | ICD-10-CM | POA: Diagnosis not present

## 2015-05-26 DIAGNOSIS — Z79899 Other long term (current) drug therapy: Secondary | ICD-10-CM | POA: Diagnosis not present

## 2015-05-26 DIAGNOSIS — M25571 Pain in right ankle and joints of right foot: Secondary | ICD-10-CM | POA: Diagnosis not present

## 2015-06-03 DIAGNOSIS — M810 Age-related osteoporosis without current pathological fracture: Secondary | ICD-10-CM | POA: Diagnosis not present

## 2015-06-03 DIAGNOSIS — M069 Rheumatoid arthritis, unspecified: Secondary | ICD-10-CM | POA: Diagnosis not present

## 2015-06-03 DIAGNOSIS — Z23 Encounter for immunization: Secondary | ICD-10-CM | POA: Diagnosis not present

## 2015-06-03 DIAGNOSIS — Z1210 Encounter for screening for malignant neoplasm of intestinal tract, unspecified: Secondary | ICD-10-CM | POA: Diagnosis not present

## 2015-06-03 DIAGNOSIS — Z0000 Encounter for general adult medical examination without abnormal findings: Secondary | ICD-10-CM | POA: Diagnosis not present

## 2015-06-05 ENCOUNTER — Other Ambulatory Visit (HOSPITAL_COMMUNITY): Payer: Self-pay | Admitting: *Deleted

## 2015-06-06 ENCOUNTER — Encounter (HOSPITAL_COMMUNITY)
Admission: RE | Admit: 2015-06-06 | Discharge: 2015-06-06 | Disposition: A | Discharge status: Home / Self Care | Payer: Medicare Other | Source: Ambulatory Visit | Attending: Rheumatology | Admitting: Rheumatology

## 2015-06-06 DIAGNOSIS — M810 Age-related osteoporosis without current pathological fracture: Secondary | ICD-10-CM | POA: Diagnosis not present

## 2015-06-06 MED ORDER — ZOLEDRONIC ACID 5 MG/100ML IV SOLN
5.0000 mg | Freq: Once | INTRAVENOUS | Status: AC
  Administered 2015-06-06: 5 mg via INTRAVENOUS

## 2015-06-06 MED ORDER — ZOLEDRONIC ACID 5 MG/100ML IV SOLN
INTRAVENOUS | Status: AC
  Filled 2015-06-06: qty 100

## 2015-07-03 DIAGNOSIS — Z23 Encounter for immunization: Secondary | ICD-10-CM | POA: Diagnosis not present

## 2015-09-05 DIAGNOSIS — Z79899 Other long term (current) drug therapy: Secondary | ICD-10-CM | POA: Diagnosis not present

## 2015-10-24 DIAGNOSIS — M79641 Pain in right hand: Secondary | ICD-10-CM | POA: Diagnosis not present

## 2015-10-24 DIAGNOSIS — M0579 Rheumatoid arthritis with rheumatoid factor of multiple sites without organ or systems involvement: Secondary | ICD-10-CM | POA: Diagnosis not present

## 2015-10-24 DIAGNOSIS — M79642 Pain in left hand: Secondary | ICD-10-CM | POA: Diagnosis not present

## 2015-10-24 DIAGNOSIS — M810 Age-related osteoporosis without current pathological fracture: Secondary | ICD-10-CM | POA: Diagnosis not present

## 2015-10-24 DIAGNOSIS — M79672 Pain in left foot: Secondary | ICD-10-CM | POA: Diagnosis not present

## 2015-10-24 DIAGNOSIS — M79671 Pain in right foot: Secondary | ICD-10-CM | POA: Diagnosis not present

## 2015-10-24 DIAGNOSIS — Z09 Encounter for follow-up examination after completed treatment for conditions other than malignant neoplasm: Secondary | ICD-10-CM | POA: Diagnosis not present

## 2015-11-04 DIAGNOSIS — N183 Chronic kidney disease, stage 3 (moderate): Secondary | ICD-10-CM | POA: Diagnosis not present

## 2015-11-04 DIAGNOSIS — E785 Hyperlipidemia, unspecified: Secondary | ICD-10-CM | POA: Diagnosis not present

## 2015-11-04 DIAGNOSIS — I129 Hypertensive chronic kidney disease with stage 1 through stage 4 chronic kidney disease, or unspecified chronic kidney disease: Secondary | ICD-10-CM | POA: Diagnosis not present

## 2015-11-17 DIAGNOSIS — I129 Hypertensive chronic kidney disease with stage 1 through stage 4 chronic kidney disease, or unspecified chronic kidney disease: Secondary | ICD-10-CM | POA: Diagnosis not present

## 2015-11-17 DIAGNOSIS — M069 Rheumatoid arthritis, unspecified: Secondary | ICD-10-CM | POA: Diagnosis not present

## 2015-11-17 DIAGNOSIS — E785 Hyperlipidemia, unspecified: Secondary | ICD-10-CM | POA: Diagnosis not present

## 2015-11-17 DIAGNOSIS — N183 Chronic kidney disease, stage 3 (moderate): Secondary | ICD-10-CM | POA: Diagnosis not present

## 2015-11-17 DIAGNOSIS — Z7189 Other specified counseling: Secondary | ICD-10-CM | POA: Diagnosis not present

## 2015-12-03 DIAGNOSIS — Z1231 Encounter for screening mammogram for malignant neoplasm of breast: Secondary | ICD-10-CM | POA: Diagnosis not present

## 2015-12-03 DIAGNOSIS — Z79899 Other long term (current) drug therapy: Secondary | ICD-10-CM | POA: Diagnosis not present

## 2015-12-23 DIAGNOSIS — M545 Low back pain: Secondary | ICD-10-CM | POA: Diagnosis not present

## 2015-12-30 DIAGNOSIS — Z7189 Other specified counseling: Secondary | ICD-10-CM | POA: Diagnosis not present

## 2015-12-30 DIAGNOSIS — R937 Abnormal findings on diagnostic imaging of other parts of musculoskeletal system: Secondary | ICD-10-CM | POA: Diagnosis not present

## 2015-12-30 DIAGNOSIS — S3992XA Unspecified injury of lower back, initial encounter: Secondary | ICD-10-CM | POA: Diagnosis not present

## 2016-01-06 DIAGNOSIS — W108XXD Fall (on) (from) other stairs and steps, subsequent encounter: Secondary | ICD-10-CM | POA: Diagnosis not present

## 2016-01-06 DIAGNOSIS — M545 Low back pain: Secondary | ICD-10-CM | POA: Diagnosis not present

## 2016-01-08 DIAGNOSIS — W108XXD Fall (on) (from) other stairs and steps, subsequent encounter: Secondary | ICD-10-CM | POA: Diagnosis not present

## 2016-01-08 DIAGNOSIS — M545 Low back pain: Secondary | ICD-10-CM | POA: Diagnosis not present

## 2016-01-20 DIAGNOSIS — M545 Low back pain: Secondary | ICD-10-CM | POA: Diagnosis not present

## 2016-01-20 DIAGNOSIS — W108XXD Fall (on) (from) other stairs and steps, subsequent encounter: Secondary | ICD-10-CM | POA: Diagnosis not present

## 2016-01-22 DIAGNOSIS — M545 Low back pain: Secondary | ICD-10-CM | POA: Diagnosis not present

## 2016-01-22 DIAGNOSIS — W108XXD Fall (on) (from) other stairs and steps, subsequent encounter: Secondary | ICD-10-CM | POA: Diagnosis not present

## 2016-01-26 DIAGNOSIS — M545 Low back pain: Secondary | ICD-10-CM | POA: Diagnosis not present

## 2016-01-26 DIAGNOSIS — W108XXD Fall (on) (from) other stairs and steps, subsequent encounter: Secondary | ICD-10-CM | POA: Diagnosis not present

## 2016-01-30 DIAGNOSIS — M545 Low back pain: Secondary | ICD-10-CM | POA: Diagnosis not present

## 2016-01-30 DIAGNOSIS — W108XXD Fall (on) (from) other stairs and steps, subsequent encounter: Secondary | ICD-10-CM | POA: Diagnosis not present

## 2016-02-04 DIAGNOSIS — W108XXD Fall (on) (from) other stairs and steps, subsequent encounter: Secondary | ICD-10-CM | POA: Diagnosis not present

## 2016-02-04 DIAGNOSIS — M545 Low back pain: Secondary | ICD-10-CM | POA: Diagnosis not present

## 2016-02-05 DIAGNOSIS — M545 Low back pain: Secondary | ICD-10-CM | POA: Diagnosis not present

## 2016-02-05 DIAGNOSIS — W108XXD Fall (on) (from) other stairs and steps, subsequent encounter: Secondary | ICD-10-CM | POA: Diagnosis not present

## 2016-02-10 DIAGNOSIS — W108XXD Fall (on) (from) other stairs and steps, subsequent encounter: Secondary | ICD-10-CM | POA: Diagnosis not present

## 2016-02-10 DIAGNOSIS — M545 Low back pain: Secondary | ICD-10-CM | POA: Diagnosis not present

## 2016-02-11 DIAGNOSIS — W108XXD Fall (on) (from) other stairs and steps, subsequent encounter: Secondary | ICD-10-CM | POA: Diagnosis not present

## 2016-02-11 DIAGNOSIS — M545 Low back pain: Secondary | ICD-10-CM | POA: Diagnosis not present

## 2016-02-26 DIAGNOSIS — K210 Gastro-esophageal reflux disease with esophagitis: Secondary | ICD-10-CM | POA: Diagnosis not present

## 2016-03-10 DIAGNOSIS — Z9225 Personal history of immunosupression therapy: Secondary | ICD-10-CM | POA: Diagnosis not present

## 2016-03-10 DIAGNOSIS — Z79899 Other long term (current) drug therapy: Secondary | ICD-10-CM | POA: Diagnosis not present

## 2016-03-29 DIAGNOSIS — Z79899 Other long term (current) drug therapy: Secondary | ICD-10-CM | POA: Diagnosis not present

## 2016-03-29 DIAGNOSIS — M0579 Rheumatoid arthritis with rheumatoid factor of multiple sites without organ or systems involvement: Secondary | ICD-10-CM | POA: Diagnosis not present

## 2016-03-29 DIAGNOSIS — Z09 Encounter for follow-up examination after completed treatment for conditions other than malignant neoplasm: Secondary | ICD-10-CM | POA: Diagnosis not present

## 2016-03-29 DIAGNOSIS — M25561 Pain in right knee: Secondary | ICD-10-CM | POA: Diagnosis not present

## 2016-03-29 DIAGNOSIS — M810 Age-related osteoporosis without current pathological fracture: Secondary | ICD-10-CM | POA: Diagnosis not present

## 2016-04-13 DIAGNOSIS — I129 Hypertensive chronic kidney disease with stage 1 through stage 4 chronic kidney disease, or unspecified chronic kidney disease: Secondary | ICD-10-CM | POA: Diagnosis not present

## 2016-04-13 DIAGNOSIS — E785 Hyperlipidemia, unspecified: Secondary | ICD-10-CM | POA: Diagnosis not present

## 2016-04-13 DIAGNOSIS — N183 Chronic kidney disease, stage 3 (moderate): Secondary | ICD-10-CM | POA: Diagnosis not present

## 2016-04-20 DIAGNOSIS — M069 Rheumatoid arthritis, unspecified: Secondary | ICD-10-CM | POA: Diagnosis not present

## 2016-04-20 DIAGNOSIS — Z6828 Body mass index (BMI) 28.0-28.9, adult: Secondary | ICD-10-CM | POA: Diagnosis not present

## 2016-04-20 DIAGNOSIS — I10 Essential (primary) hypertension: Secondary | ICD-10-CM | POA: Diagnosis not present

## 2016-04-20 DIAGNOSIS — E785 Hyperlipidemia, unspecified: Secondary | ICD-10-CM | POA: Diagnosis not present

## 2016-06-10 DIAGNOSIS — Z79899 Other long term (current) drug therapy: Secondary | ICD-10-CM | POA: Diagnosis not present

## 2016-06-18 ENCOUNTER — Other Ambulatory Visit: Payer: Self-pay | Admitting: Rheumatology

## 2016-06-18 DIAGNOSIS — M0579 Rheumatoid arthritis with rheumatoid factor of multiple sites without organ or systems involvement: Secondary | ICD-10-CM

## 2016-06-18 NOTE — Telephone Encounter (Signed)
Pt states [employer] [facility] has faxed [place] a refill request for Enbrel.

## 2016-06-21 ENCOUNTER — Telehealth: Payer: Self-pay | Admitting: Radiology

## 2016-06-21 DIAGNOSIS — M0579 Rheumatoid arthritis with rheumatoid factor of multiple sites without organ or systems involvement: Secondary | ICD-10-CM | POA: Insufficient documentation

## 2016-06-21 MED ORDER — ETANERCEPT 50 MG/ML ~~LOC~~ SOAJ: 50.0000 mg | SUBCUTANEOUS | 0 refills | Status: DC

## 2016-06-21 NOTE — Telephone Encounter (Signed)
Last visit 03/29/16 Next visit not sch message sent for scheduling  Labs 06/11/16 TB neg 03/15/16 Sent to  [facility] per Dr Estanislado Pandy

## 2016-06-21 NOTE — Telephone Encounter (Signed)
Patient needs follow up appt with Dr Deveshwar pls call to make appt.  Jan 

## 2016-06-28 ENCOUNTER — Telehealth: Payer: Self-pay | Admitting: Rheumatology

## 2016-06-28 NOTE — Telephone Encounter (Signed)
re-faxed

## 2016-06-28 NOTE — Telephone Encounter (Signed)
Patient states that the [facility] has not received an rx for Enbrel. Please resend, patient states she is out of the medication.

## 2016-06-30 DIAGNOSIS — Z23 Encounter for immunization: Secondary | ICD-10-CM | POA: Diagnosis not present

## 2016-07-05 ENCOUNTER — Other Ambulatory Visit (HOSPITAL_COMMUNITY): Payer: Self-pay

## 2016-07-06 ENCOUNTER — Ambulatory Visit (HOSPITAL_COMMUNITY)
Admission: RE | Admit: 2016-07-06 | Discharge: 2016-07-06 | Disposition: A | Discharge status: Home / Self Care | Payer: Medicare Other | Source: Ambulatory Visit | Attending: Rheumatology | Admitting: Rheumatology

## 2016-07-06 DIAGNOSIS — M810 Age-related osteoporosis without current pathological fracture: Secondary | ICD-10-CM | POA: Insufficient documentation

## 2016-07-06 MED ORDER — ZOLEDRONIC ACID 5 MG/100ML IV SOLN
5.0000 mg | Freq: Once | INTRAVENOUS | Status: AC
  Administered 2016-07-06: 5 mg via INTRAVENOUS

## 2016-07-06 MED ORDER — ZOLEDRONIC ACID 5 MG/100ML IV SOLN
INTRAVENOUS | Status: AC
  Filled 2016-07-06: qty 100

## 2016-07-06 NOTE — Progress Notes (Signed)
Treatment went without incident, VSS.  Pt states has never had any issues in past. Reinforced need for fluids and tylenol if needed.

## 2016-07-20 DIAGNOSIS — Z0000 Encounter for general adult medical examination without abnormal findings: Secondary | ICD-10-CM | POA: Diagnosis not present

## 2016-07-20 DIAGNOSIS — E785 Hyperlipidemia, unspecified: Secondary | ICD-10-CM | POA: Diagnosis not present

## 2016-07-20 DIAGNOSIS — N183 Chronic kidney disease, stage 3 (moderate): Secondary | ICD-10-CM | POA: Diagnosis not present

## 2016-07-20 DIAGNOSIS — Z139 Encounter for screening, unspecified: Secondary | ICD-10-CM | POA: Diagnosis not present

## 2016-07-20 DIAGNOSIS — R7301 Impaired fasting glucose: Secondary | ICD-10-CM | POA: Diagnosis not present

## 2016-07-20 DIAGNOSIS — I129 Hypertensive chronic kidney disease with stage 1 through stage 4 chronic kidney disease, or unspecified chronic kidney disease: Secondary | ICD-10-CM | POA: Diagnosis not present

## 2016-07-20 DIAGNOSIS — Z1389 Encounter for screening for other disorder: Secondary | ICD-10-CM | POA: Diagnosis not present

## 2016-07-28 DIAGNOSIS — H6120 Impacted cerumen, unspecified ear: Secondary | ICD-10-CM | POA: Diagnosis not present

## 2016-08-13 ENCOUNTER — Other Ambulatory Visit: Payer: Self-pay | Admitting: *Deleted

## 2016-08-13 MED ORDER — "TUBERCULIN-ALLERGY SYRINGES 27G X 1/2"" 1 ML MISC": 0 refills | Status: DC

## 2016-08-13 NOTE — Telephone Encounter (Signed)
ok 

## 2016-08-13 NOTE — Telephone Encounter (Signed)
Last Visit: 03/29/16 Next Visit: 08/30/16 Labs: 06/11/16 WNL  Okay to refill Syringes?

## 2016-08-27 DIAGNOSIS — Z96653 Presence of artificial knee joint, bilateral: Secondary | ICD-10-CM | POA: Insufficient documentation

## 2016-08-27 DIAGNOSIS — M810 Age-related osteoporosis without current pathological fracture: Secondary | ICD-10-CM | POA: Insufficient documentation

## 2016-08-27 NOTE — Progress Notes (Signed)
Office Visit Note  Patient: Andrea [name]             Date of Birth: 06/09/1941           MRN: [id number]             PCP: Marco Collie, MD Referring: Marco Collie, MD Visit Date: 08/30/2016 Occupation: @GUAROCC @    Subjective:  Pain of the Right Shoulder Follow-up on rheumatoid arthritis and high risk prescription  History of Present Illness: Andrea [name] is a 76 y.o. female  Last seen 03/29/2016 Patient gets assistance for her Enbrel through [facility]. She's filled out that form and she's been approved for Enbrel for 2018. She is out of Enbrel and is expecting a shipment tomorrow and due for her shot this Saturday. Since the patient is here today and we still have the fax in her 2018 and June [facility] [employer] prescription, we will give her sample of Enbrel sure click today so patient gets her injection on a timely basis  Patient is doing really well with rheumatoid arthritis. No joint pain stiffness or swelling.  She uses Enbrel every week She uses methotrexate 0.4 mils every week Folic acid 1 pill everyday   Patient has a history of osteoporosis and she is getting treatment with re-class. She recalls that she went a couple of months ago for her last week class infusion. She is doing well with this.  She continues to do well with her knees. She has a history of bilateral total knee replacement. She does have occasional pain to the right knee joint but no associated warmth or effusion. I've discussed Voltaren gel for this and she is agreeable and I'll write her prescription for her today.  Her last labs are from October 2017 which include CBC with differential CMP with GFR. Her creatinine is mildly elevated on that lab at 1.04. Her GFR was 60 and in normal limits.  We will order labs today. I'll also add vitamin D since she has a history of osteoporosis that she takes a supplement but I want to make sure her levels of vitamin D are normal. I will send a copy of  these labs to her PCP, Dr. Marco Collie.  The only new thing going on is mild occasional right shoulder joint pain that occurred last week and lasted for 2 days and resolved on its own. No falls no injuries. No warmth effusion. SPECT that she may just have slept wrong.  Activities of Daily Living:  Patient reports morning stiffness for 10 minutes.   Patient Denies nocturnal pain.  Difficulty dressing/grooming: Denies Difficulty climbing stairs: Denies Difficulty getting out of chair: Denies Difficulty using hands for taps, buttons, cutlery, and/or writing: Denies   Review of Systems  Constitutional: Negative for fatigue.  HENT: Negative for mouth sores and mouth dryness.   Eyes: Negative for dryness.  Respiratory: Negative for shortness of breath.   Gastrointestinal: Negative for constipation and diarrhea.  Musculoskeletal: Negative for myalgias and myalgias.  Skin: Negative for sensitivity to sunlight.  Psychiatric/Behavioral: Negative for decreased concentration and sleep disturbance.    PMFS History:  Patient Active Problem List   Diagnosis Date Noted  . History of total knee replacement, bilateral 08/27/2016  . Age-related osteoporosis without current pathological fracture 08/27/2016  . Seropositive rheumatoid arthritis of multiple sites ([place]) 06/21/2016    No past medical history on file.  No family history on file. No past surgical history on file. Social History  Social History Narrative  . No narrative on file     Objective: Vital Signs: BP 140/82   Pulse 76   Resp 14   Ht 5' 4.5" (1.638 m)   Wt 165 lb (74.8 kg)   BMI 27.88 kg/m    Physical Exam  Constitutional: She is oriented to person, place, and time. She appears well-developed and well-nourished.  HENT:  Head: Normocephalic and atraumatic.  Eyes: EOM are normal. Pupils are equal, round, and reactive to light.  Cardiovascular: Normal rate, regular rhythm and normal heart sounds.  Exam reveals no  gallop and no friction rub.   No murmur heard. Pulmonary/Chest: Effort normal and breath sounds normal. She has no wheezes. She has no rales.  Abdominal: Soft. Bowel sounds are normal. She exhibits no distension. There is no tenderness. There is no guarding. No hernia.  Musculoskeletal: Normal range of motion. She exhibits no edema, tenderness or deformity.  Lymphadenopathy:    She has no cervical adenopathy.  Neurological: She is alert and oriented to person, place, and time. Coordination normal.  Skin: Skin is warm and dry. Capillary refill takes less than 2 seconds. No rash noted.  Psychiatric: She has a normal mood and affect. Her behavior is normal.  Nursing note and vitals reviewed.    Musculoskeletal Exam:  Full range of motion of all joints Grip strength is equal and strong bilaterally Fiber myalgia tender points are all absent  CDAI Exam: CDAI Homunculus Exam:   Joint Counts:  CDAI Tender Joint count: 0 CDAI Swollen Joint count: 0  Global Assessments:  Patient Global Assessment: 2 Provider Global Assessment: 2  CDAI Calculated Score: 4  No synovitis  Investigation: Findings:  TB Gold is negative as of July 2017.  10/24/15 xrays hands and feet performed, but no reports available for review.      Imaging: No results found.  Speciality Comments: No specialty comments available.    Procedures:  No procedures performed Allergies: Patient has no known allergies.   Assessment / Plan:     Visit Diagnoses: Seropositive rheumatoid arthritis of multiple sites [facility])  History of total knee replacement, bilateral  Age-related osteoporosis without current pathological fracture - Reclast ordered nov 2017 .  DEXA done on 08/07/2014 with a T-score of negative 3.4.    High risk medication use - Enbrel and Methotrexate  - Plan: CBC with Differential/Platelet, COMPLETE METABOLIC PANEL WITH GFR, CBC with Differential/Platelet, COMPLETE METABOLIC PANEL WITH GFR  Vitamin D  deficiency - Plan: VITAMIN D 25 Hydroxy (Vit-D Deficiency, Fractures)  Plan: #1: [name] help the patient with her M [facility] [employer] prescription. I have signed it and we await Dr. Estanislado Pandy signature and then we will fax it to [place]. We asked for Enbrel 50 mg sure click to be taken once weekly. We will give her a 12 weeks supply with refills.  #2: Refill methotrexate 0.4 ML's every week, 90 day supply with no refills  #3: Refill folic acid 1 mg daily. Ninety-day supply with 4 refills  #4: CBC with differential CMP with GFR and vitamin D today in office send copy of labs to PCPs office  #5: Prescription for Voltaren gel 3 tubes with 3 refills use as directed  #6: Return to clinic in 4 months  Orders: Orders Placed This Encounter  Procedures  . CBC with Differential/Platelet  . COMPLETE METABOLIC PANEL WITH GFR  . VITAMIN D 25 Hydroxy (Vit-D Deficiency, Fractures)  . CBC with Differential/Platelet  .  COMPLETE METABOLIC PANEL WITH GFR   Meds ordered this encounter  Medications  . diclofenac sodium (VOLTAREN) 1 % GEL    Sig: Voltaren Gel 3 grams to 3 large joints upto TID 3 TUBES with 3 refills    Dispense:  3 Tube    Refill:  3    Voltaren Gel 3 grams to 3 large joints upto TID 3 TUBES with 3 refills    Order Specific Question:   Supervising Provider    Answer:   Lyda Perone    Face-to-face time spent with patient was 30 minutes. 50% of time was spent in counseling and coordination of care.  Follow-Up Instructions: Return in about 4 months (around 12/28/2016) for RA, Enbrel q wk, mtx [id number], folic, folic 1mg  qd;.   [employer], PA-C . Bo Merino, MD, FACR Note - This record has been created using [employer].  Chart creation errors have been sought, but may not always  have been located. Such creation errors do not reflect on  the standard of medical care.

## 2016-08-30 ENCOUNTER — Ambulatory Visit (INDEPENDENT_AMBULATORY_CARE_PROVIDER_SITE_OTHER): Payer: Medicare Other | Admitting: Rheumatology

## 2016-08-30 ENCOUNTER — Ambulatory Visit: Payer: Self-pay | Admitting: Rheumatology

## 2016-08-30 ENCOUNTER — Encounter: Payer: Self-pay | Admitting: Rheumatology

## 2016-08-30 VITALS — BP 140/82 | HR 76 | Resp 14 | Ht 64.5 in | Wt 165.0 lb

## 2016-08-30 DIAGNOSIS — M810 Age-related osteoporosis without current pathological fracture: Secondary | ICD-10-CM | POA: Diagnosis not present

## 2016-08-30 DIAGNOSIS — M0579 Rheumatoid arthritis with rheumatoid factor of multiple sites without organ or systems involvement: Secondary | ICD-10-CM

## 2016-08-30 DIAGNOSIS — E559 Vitamin D deficiency, unspecified: Secondary | ICD-10-CM | POA: Diagnosis not present

## 2016-08-30 DIAGNOSIS — Z79899 Other long term (current) drug therapy: Secondary | ICD-10-CM | POA: Diagnosis not present

## 2016-08-30 DIAGNOSIS — Z96653 Presence of artificial knee joint, bilateral: Secondary | ICD-10-CM | POA: Diagnosis not present

## 2016-08-30 LAB — COMPLETE METABOLIC PANEL WITH GFR
ALT: 12 U/L (ref 6–29)
AST: 21 U/L (ref 10–35)
Albumin: 3.7 g/dL (ref 3.6–5.1)
Alkaline Phosphatase: 48 U/L (ref 33–130)
BUN: 20 mg/dL (ref 7–25)
CHLORIDE: 101 mmol/L (ref 98–110)
CO2: 29 mmol/L (ref 20–31)
Calcium: 9.2 mg/dL (ref 8.6–10.4)
Creat: 1.06 mg/dL — ABNORMAL HIGH (ref 0.60–0.93)
GFR, Est African American: 59 mL/min — ABNORMAL LOW (ref >=60)
GFR, Est Non African American: 51 mL/min — ABNORMAL LOW (ref >=60)
GLUCOSE: 87 mg/dL (ref 65–99)
POTASSIUM: 3.6 mmol/L (ref 3.5–5.3)
SODIUM: 137 mmol/L (ref 135–146)
Total Bilirubin: 0.5 mg/dL (ref 0.2–1.2)
Total Protein: 7.7 g/dL (ref 6.1–8.1)

## 2016-08-30 LAB — CBC WITH DIFFERENTIAL/PLATELET
Basophils Absolute: 35 cells/uL (ref 0–200)
Basophils Relative: 1 %
EOS PCT: 9 %
Eosinophils Absolute: 315 cells/uL (ref 15–500)
HCT: 37.5 % (ref 35.0–45.0)
Hemoglobin: 12.6 g/dL (ref 11.7–15.5)
LYMPHS PCT: 46 %
Lymphs Abs: 1610 cells/uL (ref 850–3900)
MCH: 31.4 pg (ref 27.0–33.0)
MCHC: 33.6 g/dL (ref 32.0–36.0)
MCV: 93.5 fL (ref 80.0–100.0)
MPV: 9.2 fL (ref 7.5–12.5)
Monocytes Absolute: 245 cells/uL (ref 200–950)
Monocytes Relative: 7 %
NEUTROS PCT: 37 %
Neutro Abs: 1295 cells/uL — ABNORMAL LOW (ref 1500–7800)
Platelets: 228 10*3/uL (ref 140–400)
RBC: 4.01 MIL/uL (ref 3.80–5.10)
RDW: 13.8 % (ref 11.0–15.0)
WBC: 3.5 10*3/uL — AB (ref 3.8–10.8)

## 2016-08-30 MED ORDER — DICLOFENAC SODIUM 1 % TD GEL: TRANSDERMAL | 3 refills | Status: AC

## 2016-08-31 LAB — VITAMIN D 25 HYDROXY (VIT D DEFICIENCY, FRACTURES): Vit D, 25-Hydroxy: 54 ng/mL (ref 30–100)

## 2016-10-19 ENCOUNTER — Telehealth: Payer: Self-pay | Admitting: Pharmacist

## 2016-10-19 NOTE — Telephone Encounter (Signed)
Discussed with Dr. Estanislado Pandy.  She wants patient to get labs 2 months after most recent labs.    I called patient and informed her Enbrel refill was sent.  Informed her she will be due for labs the first week of March.  Reviewed standing lab times with patient.  Patient confirms she will come in next week for standing labs.  Patient has active standing lab orders in the computer.     Elisabeth Most, Pharm.D., BCPS, CPP Clinical Pharmacist Pager: [phone] Phone: [phone] 10/19/2016 12:59 PM

## 2016-10-19 NOTE — Telephone Encounter (Signed)
Ok. Recheck labs in  months

## 2016-10-19 NOTE — Telephone Encounter (Signed)
Received fax from [employer] [facility] regarding refill request of Enbrel.   Last visit: 08/30/16 Next visit: 12/28/16 Labs: 08/30/16 CMP with Cr 1.06, GFR 59; CBC with WBC 3.5, Absolute neutrophils 1295 03/10/16 TB Gold negative  Okay to refill Enbrel?  Renewal form is on your desk.

## 2016-10-25 ENCOUNTER — Other Ambulatory Visit: Payer: Self-pay | Admitting: Radiology

## 2016-10-25 DIAGNOSIS — Z79899 Other long term (current) drug therapy: Secondary | ICD-10-CM

## 2016-10-25 LAB — COMPLETE METABOLIC PANEL WITH GFR
ALT: 10 U/L (ref 6–29)
AST: 17 U/L (ref 10–35)
Albumin: 3.6 g/dL (ref 3.6–5.1)
Alkaline Phosphatase: 55 U/L (ref 33–130)
BUN: 22 mg/dL (ref 7–25)
CALCIUM: 9.6 mg/dL (ref 8.6–10.4)
CHLORIDE: 101 mmol/L (ref 98–110)
CO2: 31 mmol/L (ref 20–31)
Creat: 1.09 mg/dL — ABNORMAL HIGH (ref 0.60–0.93)
GFR, EST NON AFRICAN AMERICAN: 49 mL/min — AB (ref >=60)
GFR, Est African American: 57 mL/min — ABNORMAL LOW (ref >=60)
Glucose, Bld: 84 mg/dL (ref 65–99)
POTASSIUM: 4.1 mmol/L (ref 3.5–5.3)
Sodium: 139 mmol/L (ref 135–146)
Total Bilirubin: 0.5 mg/dL (ref 0.2–1.2)
Total Protein: 7 g/dL (ref 6.1–8.1)

## 2016-10-25 LAB — CBC WITH DIFFERENTIAL/PLATELET
Basophils Absolute: 40 cells/uL (ref 0–200)
Basophils Relative: 1 %
Eosinophils Absolute: 360 cells/uL (ref 15–500)
Eosinophils Relative: 9 %
HEMATOCRIT: 37.1 % (ref 35.0–45.0)
Hemoglobin: 12.5 g/dL (ref 11.7–15.5)
LYMPHS PCT: 47 %
Lymphs Abs: 1880 cells/uL (ref 850–3900)
MCH: 31.4 pg (ref 27.0–33.0)
MCHC: 33.7 g/dL (ref 32.0–36.0)
MCV: 93.2 fL (ref 80.0–100.0)
MONO ABS: 240 {cells}/uL (ref 200–950)
MPV: 9.2 fL (ref 7.5–12.5)
Monocytes Relative: 6 %
NEUTROS PCT: 37 %
Neutro Abs: 1480 cells/uL — ABNORMAL LOW (ref 1500–7800)
Platelets: 214 10*3/uL (ref 140–400)
RBC: 3.98 MIL/uL (ref 3.80–5.10)
RDW: 14.2 % (ref 11.0–15.0)
WBC: 4 10*3/uL (ref 3.8–10.8)

## 2016-10-28 DIAGNOSIS — Z961 Presence of intraocular lens: Secondary | ICD-10-CM | POA: Diagnosis not present

## 2016-10-28 DIAGNOSIS — H18413 Arcus senilis, bilateral: Secondary | ICD-10-CM | POA: Diagnosis not present

## 2016-11-05 ENCOUNTER — Other Ambulatory Visit: Payer: Self-pay | Admitting: Rheumatology

## 2016-11-05 NOTE — Telephone Encounter (Signed)
Last Visit: 08/30/16 Next visit: 12/28/16 Labs: 10/25/16 CMP with GFR is normal except for slight elevation of creatinine 1.09 and slight decrease in GFR  Okay to refill MTX?

## 2016-11-18 DIAGNOSIS — I129 Hypertensive chronic kidney disease with stage 1 through stage 4 chronic kidney disease, or unspecified chronic kidney disease: Secondary | ICD-10-CM | POA: Diagnosis not present

## 2016-11-18 DIAGNOSIS — N183 Chronic kidney disease, stage 3 (moderate): Secondary | ICD-10-CM | POA: Diagnosis not present

## 2016-11-18 DIAGNOSIS — K5900 Constipation, unspecified: Secondary | ICD-10-CM | POA: Diagnosis not present

## 2016-11-18 DIAGNOSIS — E785 Hyperlipidemia, unspecified: Secondary | ICD-10-CM | POA: Diagnosis not present

## 2016-12-02 DIAGNOSIS — I129 Hypertensive chronic kidney disease with stage 1 through stage 4 chronic kidney disease, or unspecified chronic kidney disease: Secondary | ICD-10-CM | POA: Diagnosis not present

## 2016-12-27 DIAGNOSIS — E559 Vitamin D deficiency, unspecified: Secondary | ICD-10-CM | POA: Insufficient documentation

## 2016-12-27 DIAGNOSIS — Z79899 Other long term (current) drug therapy: Secondary | ICD-10-CM | POA: Insufficient documentation

## 2016-12-27 NOTE — Progress Notes (Signed)
Office Visit Note  Patient: Andrea [name]             Date of Birth: [birth year]           MRN: [id number]             PCP: Marco Collie, MD Referring: Marco Collie, MD Visit Date: 12/28/2016 Occupation: _0 @    Subjective:  Follow-up and Neck Pain (headaches )   History of Present Illness: Andrea [name] is a 76 y.o. female  Last seen 08/30/2016. Patient is doing well with her rheumatoid arthritis. Taking Enbrel every week. Using methotrexate 0.4 ML's every week Folic acid 1 mg every day. No complaint with her rheumatoid arthritis.  She is complaining of right shoulder joint pain. The right shoulder joint pain is been going on for a month.  Patient is also having some neck pain more so on the right side.  Activities of Daily Living:  Patient reports morning stiffness for 15 minutes.   Patient Reports nocturnal pain.  Difficulty dressing/grooming: Reports Difficulty climbing stairs: Denies Difficulty getting out of chair: Denies Difficulty using hands for taps, buttons, cutlery, and/or writing: Denies   Review of Systems  Constitutional: Negative for fatigue.  HENT: Negative for mouth sores and mouth dryness.   Eyes: Negative for dryness.  Respiratory: Negative for shortness of breath.   Gastrointestinal: Negative for constipation and diarrhea.  Musculoskeletal: Negative for myalgias and myalgias.  Skin: Negative for sensitivity to sunlight.  Psychiatric/Behavioral: Negative for decreased concentration and sleep disturbance.    PMFS History:  Patient Active Problem List   Diagnosis Date Noted  . High risk medication use 12/27/2016  . Vitamin D deficiency 12/27/2016  . History of total knee replacement, bilateral 08/27/2016  . Age-related osteoporosis without current pathological fracture 08/27/2016  . Seropositive rheumatoid arthritis of multiple sites ([place]) 06/21/2016    Past Medical History:  Diagnosis Date  . Arthritis   . Hypertension     No  family history on file. Past Surgical History:  Procedure Laterality Date  . KNEE ARTHROPLASTY     Social History   Social History Narrative  . No narrative on file     Objective: Vital Signs: BP 122/70   Pulse 68 Comment: irregular  Resp 14   Ht 5' 4" (1.626 m)   Wt 168 lb (76.2 kg)   BMI 28.84 kg/m    Physical Exam  Constitutional: She is oriented to person, place, and time. She appears well-developed and well-nourished.  HENT:  Head: Normocephalic and atraumatic.  Eyes: EOM are normal. Pupils are equal, round, and reactive to light.  Cardiovascular: Normal rate, regular rhythm and normal heart sounds.  Exam reveals no gallop and no friction rub.   No murmur heard. Pulmonary/Chest: Effort normal and breath sounds normal. She has no wheezes. She has no rales.  Abdominal: Soft. Bowel sounds are normal. She exhibits no distension. There is no tenderness. There is no guarding. No hernia.  Musculoskeletal: Normal range of motion. She exhibits no edema, tenderness or deformity.  Lymphadenopathy:    She has no cervical adenopathy.  Neurological: She is alert and oriented to person, place, and time. Coordination normal.  Skin: Skin is warm and dry. Capillary refill takes less than 2 seconds. No rash noted.  Psychiatric: She has a normal mood and affect. Her behavior is normal.  Nursing note and vitals reviewed.    Musculoskeletal Exam: C-spine good range of motion she had bilateral trapezius spasm . She had  painful range of motion of her right shoulder joint. She is some synovial thickening over her MCP joints but no synovitis was noted.   CDAI Exam: CDAI Homunculus Exam:   Joint Counts:  CDAI Tender Joint count: 0 CDAI Swollen Joint count: 0  Global Assessments:  Patient Global Assessment: 7 Provider Global Assessment: 7  CDAI Calculated Score: 14    Investigation: No additional findings.  Orders Only on 10/25/2016  Component Date Value Ref Range Status  . WBC  10/25/2016 4.0  3.8 - 10.8 K/uL Final  . RBC 10/25/2016 3.98  3.80 - 5.10 MIL/uL Final  . Hemoglobin 10/25/2016 12.5  11.7 - 15.5 g/dL Final  . HCT 10/25/2016 37.1  35.0 - 45.0 % Final  . MCV 10/25/2016 93.2  80.0 - 100.0 fL Final  . MCH 10/25/2016 31.4  27.0 - 33.0 pg Final  . MCHC 10/25/2016 33.7  32.0 - 36.0 g/dL Final  . RDW 10/25/2016 14.2  11.0 - 15.0 % Final  . Platelets 10/25/2016 214  140 - 400 K/uL Final  . MPV 10/25/2016 9.2  7.5 - 12.5 fL Final  . Neutro Abs 10/25/2016 1480* 1,500 - 7,800 cells/uL Final  . Lymphs Abs 10/25/2016 1880  850 - 3,900 cells/uL Final  . Monocytes Absolute 10/25/2016 240  200 - 950 cells/uL Final  . Eosinophils Absolute 10/25/2016 360  15 - 500 cells/uL Final  . Basophils Absolute 10/25/2016 40  0 - 200 cells/uL Final  . Neutrophils Relative % 10/25/2016 37  % Final  . Lymphocytes Relative 10/25/2016 47  % Final  . Monocytes Relative 10/25/2016 6  % Final  . Eosinophils Relative 10/25/2016 9  % Final  . Basophils Relative 10/25/2016 1  % Final  . Smear Review 10/25/2016 Criteria for review not met   Final  . Sodium 10/25/2016 139  135 - 146 mmol/L Final  . Potassium 10/25/2016 4.1  3.5 - 5.3 mmol/L Final  . Chloride 10/25/2016 101  98 - 110 mmol/L Final  . CO2 10/25/2016 31  20 - 31 mmol/L Final  . Glucose, Bld 10/25/2016 84  65 - 99 mg/dL Final  . BUN 10/25/2016 22  7 - 25 mg/dL Final  . Creat 10/25/2016 1.09* 0.60 - 0.93 mg/dL Final   Comment:   For patients > or = 78 years of age: The upper reference limit for Creatinine is approximately 13% higher for people identified as African-American.     . Total Bilirubin 10/25/2016 0.5  0.2 - 1.2 mg/dL Final  . Alkaline Phosphatase 10/25/2016 55  33 - 130 U/L Final  . AST 10/25/2016 17  10 - 35 U/L Final  . ALT 10/25/2016 10  6 - 29 U/L Final  . Total Protein 10/25/2016 7.0  6.1 - 8.1 g/dL Final  . Albumin 10/25/2016 3.6  3.6 - 5.1 g/dL Final  . Calcium 10/25/2016 9.6  8.6 - 10.4 mg/dL Final  .  GFR, Est African American 10/25/2016 57* >=60 mL/min Final  . GFR, Est Non African American 10/25/2016 49* >=60 mL/min Final    Imaging: No results found.  Speciality Comments: No specialty comments available.    Procedures:  Large Joint Inj Date/Time: 12/28/2016 11:29 AM Performed by: Eliezer Lofts Authorized by: Eliezer Lofts   Consent Given by:  Patient Site marked: the procedure site was marked   Timeout: prior to procedure the correct patient, procedure, and site was verified   Indications:  Pain Location:  Shoulder Site:  R glenohumeral Prep: patient was  prepped and draped in usual sterile fashion   Needle Size:  27 G Needle Length:  1.5 inches Approach:  Posterior Ultrasound Guidance: No   Fluoroscopic Guidance: No   Arthrogram: No   Medications:  40 mg triamcinolone acetonide 40 MG/ML; 1.5 mL lidocaine 1 % Aspiration Attempted: Yes   Aspirate amount (mL):  0 Patient tolerance:  Patient tolerated the procedure well with no immediate complications  Right shoulder joint was injected. Pain was 7 before the injection Pain was rated at had 1 a few minutes after the injection. Trigger Point Inj Date/Time: 12/28/2016 11:30 AM Performed by: Eliezer Lofts Authorized by: Eliezer Lofts   Consent Given by:  Patient Site marked: the procedure site was marked   Timeout: prior to procedure the correct patient, procedure, and site was verified   Indications:  Muscle spasm and pain Total # of Trigger Points:  2 Location: neck   Needle Size:  27 G Approach:  Dorsal Medications #1:  0.3 mL lidocaine 1 %; 10 mg triamcinolone acetonide 40 MG/ML Medications #2:  0.3 mL lidocaine 1 %; 10 mg triamcinolone acetonide 40 MG/ML Patient tolerance:  Patient tolerated the procedure well with no immediate complications Comments: Bilateral trapezius muscles were injected today. Patient tolerated procedure well.   Allergies: Patient has no known allergies.   Assessment / Plan:       Visit Diagnoses: Seropositive rheumatoid arthritis of multiple sites ([place])  Age-related osteoporosis without current pathological fracture  History of total knee replacement, bilateral  High risk medication use - Plan: CBC with Differential/Platelet, COMPLETE METABOLIC PANEL WITH GFR  Vitamin D deficiency  High risk medication use - Enbrel and Methotrexate  - Plan: CBC with Differential/Platelet, COMPLETE METABOLIC PANEL WITH GFR   Plan: #1:Sero positive rheumatoid arthritis. No joint pain swelling and stiffness. Patient is doing well with the Enbrel and methotrexate.  #2: High risk prescription Enbrel every week Methotrexate 0.4 ML's every week (Saturday) Folic acid 1 mg every day  #3: Right shoulder joint pain. Has some difficulty raising her hands but is able to force herself to rates it. Her overall pain as 7 on a scale of 0-10. Right shoulder joint is consistent with bursitis. Please see procedure notes for full details.  #4: Right neck pain. Some trapezius muscle spasm. It could be stemming from her right shoulder joint also. I will offer the patient Voltaren gel.  #5: The following injections were given today. Right shoulder joint was injected using the posterior approach. 40 mg of Kenalog mixed with one half mL's 1% lidocaine  Bilateral trapezius muscles were injected. 10 mg of Kenalog mixed with 0.3 muscle percent lidocaine.  Please see procedure note for full details  Orders: Orders Placed This Encounter  Procedures  . Large Joint Injection/Arthrocentesis  . Trigger Point Injection   Meds ordered this encounter  Medications  . Tuberculin-Allergy Syringes 27G X 1/2" 1 ML MISC    Sig: Patient to use to inject Methotrexate weekly.    Dispense:  12 each    Refill:  4    Order Specific Question:   Supervising Provider    Answer:   Bo Merino [phone]    Face-to-face time spent with patient was 30 minutes. 50% of time was spent in counseling and  coordination of care.  Follow-Up Instructions: No Follow-up on file.   Eliezer Lofts, PA-C  Patient had no synovitis on examination. Although she was having discomfort in her right shoulder joint with painful range of motion. She also  had bilateral trapezius spasm. I examined and evaluated the patient with Eliezer Lofts PA. The plan of care was discussed as noted above.  Bo Merino, MD Note - This record has been created using Editor, commissioning.  Chart creation errors have been sought, but may not always  have been located. Such creation errors do not reflect on  the standard of medical care.

## 2016-12-28 ENCOUNTER — Ambulatory Visit: Payer: Medicare Other | Admitting: Rheumatology

## 2016-12-28 ENCOUNTER — Encounter: Payer: Self-pay | Admitting: Rheumatology

## 2016-12-28 VITALS — BP 122/70 | HR 68 | Resp 14 | Ht 64.0 in | Wt 168.0 lb

## 2016-12-28 DIAGNOSIS — Z79899 Other long term (current) drug therapy: Secondary | ICD-10-CM

## 2016-12-28 DIAGNOSIS — Z96653 Presence of artificial knee joint, bilateral: Secondary | ICD-10-CM

## 2016-12-28 DIAGNOSIS — M62838 Other muscle spasm: Secondary | ICD-10-CM

## 2016-12-28 DIAGNOSIS — M0579 Rheumatoid arthritis with rheumatoid factor of multiple sites without organ or systems involvement: Secondary | ICD-10-CM

## 2016-12-28 DIAGNOSIS — M810 Age-related osteoporosis without current pathological fracture: Secondary | ICD-10-CM

## 2016-12-28 DIAGNOSIS — E559 Vitamin D deficiency, unspecified: Secondary | ICD-10-CM

## 2016-12-28 DIAGNOSIS — M7551 Bursitis of right shoulder: Secondary | ICD-10-CM

## 2016-12-28 LAB — COMPLETE METABOLIC PANEL WITH GFR
ALBUMIN: 3.8 g/dL (ref 3.6–5.1)
ALK PHOS: 54 U/L (ref 33–130)
ALT: 10 U/L (ref 6–29)
AST: 18 U/L (ref 10–35)
BILIRUBIN TOTAL: 0.4 mg/dL (ref 0.2–1.2)
BUN: 21 mg/dL (ref 7–25)
CALCIUM: 9.4 mg/dL (ref 8.6–10.4)
CO2: 29 mmol/L (ref 20–31)
CREATININE: 1.09 mg/dL — AB (ref 0.60–0.93)
Chloride: 101 mmol/L (ref 98–110)
GFR, Est African American: 57 mL/min — ABNORMAL LOW (ref >=60)
GFR, Est Non African American: 49 mL/min — ABNORMAL LOW (ref >=60)
Glucose, Bld: 83 mg/dL (ref 65–99)
POTASSIUM: 3.9 mmol/L (ref 3.5–5.3)
Sodium: 139 mmol/L (ref 135–146)
TOTAL PROTEIN: 7.6 g/dL (ref 6.1–8.1)

## 2016-12-28 LAB — CBC WITH DIFFERENTIAL/PLATELET
Basophils Absolute: 49 cells/uL (ref 0–200)
Basophils Relative: 1 %
EOS PCT: 6 %
Eosinophils Absolute: 294 cells/uL (ref 15–500)
HCT: 36.2 % (ref 35.0–45.0)
Hemoglobin: 12.2 g/dL (ref 11.7–15.5)
LYMPHS PCT: 37 %
Lymphs Abs: 1813 cells/uL (ref 850–3900)
MCH: 31.4 pg (ref 27.0–33.0)
MCHC: 33.7 g/dL (ref 32.0–36.0)
MCV: 93.3 fL (ref 80.0–100.0)
MONOS PCT: 8 %
MPV: 9 fL (ref 7.5–12.5)
Monocytes Absolute: 392 cells/uL (ref 200–950)
NEUTROS PCT: 48 %
Neutro Abs: 2352 cells/uL (ref 1500–7800)
PLATELETS: 217 10*3/uL (ref 140–400)
RBC: 3.88 MIL/uL (ref 3.80–5.10)
RDW: 14 % (ref 11.0–15.0)
WBC: 4.9 10*3/uL (ref 3.8–10.8)

## 2016-12-28 MED ORDER — LIDOCAINE HCL 1 % IJ SOLN
0.3000 mL | INTRAMUSCULAR | Status: AC | PRN
  Administered 2016-12-28: .3 mL

## 2016-12-28 MED ORDER — LIDOCAINE HCL 1 % IJ SOLN
1.5000 mL | INTRAMUSCULAR | Status: AC | PRN
  Administered 2016-12-28: 1.5 mL

## 2016-12-28 MED ORDER — TRIAMCINOLONE ACETONIDE 40 MG/ML IJ SUSP
10.0000 mg | INTRAMUSCULAR | Status: AC | PRN
  Administered 2016-12-28: 10 mg via INTRAMUSCULAR

## 2016-12-28 MED ORDER — TRIAMCINOLONE ACETONIDE 40 MG/ML IJ SUSP
40.0000 mg | INTRAMUSCULAR | Status: AC | PRN
  Administered 2016-12-28: 40 mg via INTRA_ARTICULAR

## 2016-12-28 MED ORDER — "TUBERCULIN-ALLERGY SYRINGES 27G X 1/2"" 1 ML MISC": 4 refills | Status: DC

## 2016-12-28 NOTE — Patient Instructions (Signed)
Standing Labs We placed an order today for your standing lab work.    Please come back and get your standing labs in  3 months (July 2018).  We have open lab Monday through Friday from 8:30-11:30 AM and 1:30-4 PM at the office of Dr. Tresa Moore, PA.   The office is located at [address] [place], [place], [place] [address] No appointment is necessary.   Labs are drawn by [facility].  You may receive a bill from [facility] for your lab work.      ===============================  Shoulder Exercises Ask your health care provider which exercises are safe for you. Do exercises exactly as told by your health care provider and adjust them as directed. It is normal to feel mild stretching, pulling, tightness, or discomfort as you do these exercises, but you should stop right away if you feel sudden pain or your pain gets worse.Do not begin these exercises until told by your health care provider. RANGE OF MOTION EXERCISES  These exercises warm up your muscles and joints and improve the movement and flexibility of your shoulder. These exercises also help to relieve pain, numbness, and tingling. These exercises involve stretching your injured shoulder directly. Exercise A: Pendulum   1. Stand near a wall or a surface that you can hold onto for balance. 2. Bend at the waist and let your left / right arm hang straight down. Use your other arm to support you. Keep your back straight and do not lock your knees. 3. Relax your left / right arm and shoulder muscles, and move your hips and your trunk so your left / right arm swings freely. Your arm should swing because of the motion of your body, not because you are using your arm or shoulder muscles. 4. Keep moving your body so your arm swings in the following directions, as told by your health care provider:  Side to side.  Forward and backward.  In clockwise and counterclockwise circles. 5. Continue each motion for __________  seconds, or for as long as told by your health care provider. 6. Slowly return to the starting position. Repeat __________ times. Complete this exercise __________ times a day. Exercise B:Flexion, Standing   1. Stand and hold a broomstick, a cane, or a similar object. Place your hands a little more than shoulder-width apart on the object. Your left / right hand should be palm-up, and your other hand should be palm-down. 2. Keep your elbow straight and keep your shoulder muscles relaxed. Push the stick down with your healthy arm to raise your left / right arm in front of your body, and then over your head until you feel a stretch in your shoulder.  Avoid shrugging your shoulder while you raise your arm. Keep your shoulder blade tucked down toward the middle of your back. 3. Hold for __________ seconds. 4. Slowly return to the starting position. Repeat __________ times. Complete this exercise __________ times a day. Exercise C: Abduction, Standing  1. Stand and hold a broomstick, a cane, or a similar object. Place your hands a little more than shoulder-width apart on the object. Your left / right hand should be palm-up, and your other hand should be palm-down. 2. While keeping your elbow straight and your shoulder muscles relaxed, push the stick across your body toward your left / right side. Raise your left / right arm to the side of your body and then over your head until you feel a stretch in your  shoulder.  Do not raise your arm above shoulder height, unless your health care provider tells you to do that.  Avoid shrugging your shoulder while you raise your arm. Keep your shoulder blade tucked down toward the middle of your back. 3. Hold for __________ seconds. 4. Slowly return to the starting position. Repeat __________ times. Complete this exercise __________ times a day. Exercise D:Internal Rotation   1. Place your left / right hand behind your back, palm-up. 2. Use your other hand to  dangle an exercise band, a towel, or a similar object over your shoulder. Grasp the band with your left / right hand so you are holding onto both ends. 3. Gently pull up on the band until you feel a stretch in the front of your left / right shoulder.  Avoid shrugging your shoulder while you raise your arm. Keep your shoulder blade tucked down toward the middle of your back. 4. Hold for __________ seconds. 5. Release the stretch by letting go of the band and lowering your hands. Repeat __________ times. Complete this exercise __________ times a day. STRETCHING EXERCISES  These exercises warm up your muscles and joints and improve the movement and flexibility of your shoulder. These exercises also help to relieve pain, numbness, and tingling. These exercises are done using your healthy shoulder to help stretch the muscles of your injured shoulder. Exercise E: Warehouse manager (External Rotation and Abduction)   1. Stand in a doorway with one of your feet slightly in front of the other. This is called a staggered stance. If you cannot reach your forearms to the door frame, stand facing a corner of a room. 2. Choose one of the following positions as told by your health care provider:  Place your hands and forearms on the door frame above your head.  Place your hands and forearms on the door frame at the height of your head.  Place your hands on the door frame at the height of your elbows. 3. Slowly move your weight onto your front foot until you feel a stretch across your chest and in the front of your shoulders. Keep your head and chest upright and keep your abdominal muscles tight. 4. Hold for __________ seconds. 5. To release the stretch, shift your weight to your back foot. Repeat __________ times. Complete this stretch __________ times a day. Exercise F:Extension, Standing  1. Stand and hold a broomstick, a cane, or a similar object behind your back.  Your hands should be a little wider  than shoulder-width apart.  Your palms should face away from your back. 2. Keeping your elbows straight and keeping your shoulder muscles relaxed, move the stick away from your body until you feel a stretch in your shoulder.  Avoid shrugging your shoulders while you move the stick. Keep your shoulder blade tucked down toward the middle of your back. 3. Hold for __________ seconds. 4. Slowly return to the starting position. Repeat __________ times. Complete this exercise __________ times a day. STRENGTHENING EXERCISES  These exercises build strength and endurance in your shoulder. Endurance is the ability to use your muscles for a long time, even after they get tired. Exercise G:External Rotation   1. Sit in a stable chair without armrests. 2. Secure an exercise band at elbow height on your left / right side. 3. Place a soft object, such as a folded towel or a small pillow, between your left / right upper arm and your body to move your elbow a few  inches away (about 10 cm) from your side. 4. Hold the end of the band so it is tight and there is no slack. 5. Keeping your elbow pressed against the soft object, move your left / right forearm out, away from your abdomen. Keep your body steady so only your forearm moves. 6. Hold for __________ seconds. 7. Slowly return to the starting position. Repeat __________ times. Complete this exercise __________ times a day. Exercise H:Shoulder Abduction   1. Sit in a stable chair without armrests, or stand. 2. Hold a __________ weight in your left / right hand, or hold an exercise band with both hands. 3. Start with your arms straight down and your left / right palm facing in, toward your body. 4. Slowly lift your left / right hand out to your side. Do not lift your hand above shoulder height unless your health care provider tells you that this is safe.  Keep your arms straight.  Avoid shrugging your shoulder while you do this movement. Keep your  shoulder blade tucked down toward the middle of your back. 5. Hold for __________ seconds. 6. Slowly lower your arm, and return to the starting position. Repeat __________ times. Complete this exercise __________ times a day. Exercise I:Shoulder Extension  1. Sit in a stable chair without armrests, or stand. 2. Secure an exercise band to a stable object in front of you where it is at shoulder height. 3. Hold one end of the exercise band in each hand. Your palms should face each other. 4. Straighten your elbows and lift your hands up to shoulder height. 5. Step back, away from the secured end of the exercise band, until the band is tight and there is no slack. 6. Squeeze your shoulder blades together as you pull your hands down to the sides of your thighs. Stop when your hands are straight down by your sides. Do not let your hands go behind your body. 7. Hold for __________ seconds. 8. Slowly return to the starting position. Repeat __________ times. Complete this exercise __________ times a day. Exercise J:Standing Shoulder Row  1. Sit in a stable chair without armrests, or stand. 2. Secure an exercise band to a stable object in front of you so it is at waist height. 3. Hold one end of the exercise band in each hand. Your palms should be in a thumbs-up position. 4. Bend each of your elbows to an "L" shape (about 90 degrees) and keep your upper arms at your sides. 5. Step back until the band is tight and there is no slack. 6. Slowly pull your elbows back behind you. 7. Hold for __________ seconds. 8. Slowly return to the starting position. Repeat __________ times. Complete this exercise __________ times a day. Exercise K:Shoulder Press-Ups   1. Sit in a stable chair that has armrests. Sit upright, with your feet flat on the floor. 2. Put your hands on the armrests so your elbows are bent and your fingers are pointing forward. Your hands should be about even with the sides of your  body. 3. Push down on the armrests and use your arms to lift yourself off of the chair. Straighten your elbows and lift yourself up as much as you comfortably can.  Move your shoulder blades down, and avoid letting your shoulders move up toward your ears.  Keep your feet on the ground. As you get stronger, your feet should support less of your body weight as you lift yourself up. 4. Hold for  __________ seconds. 5. Slowly lower yourself back into the chair. Repeat __________ times. Complete this exercise __________ times a day. Exercise L: Wall Push-Ups   1. Stand so you are facing a stable wall. Your feet should be about one arm-length away from the wall. 2. Lean forward and place your palms on the wall at shoulder height. 3. Keep your feet flat on the floor as you bend your elbows and lean forward toward the wall. 4. Hold for __________ seconds. 5. Straighten your elbows to push yourself back to the starting position. Repeat __________ times. Complete this exercise __________ times a day. This information is not intended to replace advice given to you by your health care provider. Make sure you discuss any questions you have with your health care provider. Document Released: 06/23/2005 Document Revised: 05/03/2016 Document Reviewed: 04/20/2015 Elsevier Interactive Patient Education  2017 [employer].

## 2017-01-03 DIAGNOSIS — Z1231 Encounter for screening mammogram for malignant neoplasm of breast: Secondary | ICD-10-CM | POA: Diagnosis not present

## 2017-01-03 DIAGNOSIS — M8589 Other specified disorders of bone density and structure, multiple sites: Secondary | ICD-10-CM | POA: Diagnosis not present

## 2017-01-03 DIAGNOSIS — M810 Age-related osteoporosis without current pathological fracture: Secondary | ICD-10-CM | POA: Diagnosis not present

## 2017-01-05 ENCOUNTER — Telehealth: Payer: Self-pay | Admitting: Rheumatology

## 2017-01-05 NOTE — Telephone Encounter (Signed)
Patient returning Andrea's call. °

## 2017-01-05 NOTE — Telephone Encounter (Signed)
Patient advised of lab results and verbalized understanding. Patient advised we received her bone density scan results and she needs to come to office to review.Patient was transferred to front to schedule an appointment.

## 2017-01-12 DIAGNOSIS — R05 Cough: Secondary | ICD-10-CM | POA: Diagnosis not present

## 2017-01-12 DIAGNOSIS — Z6827 Body mass index (BMI) 27.0-27.9, adult: Secondary | ICD-10-CM | POA: Diagnosis not present

## 2017-01-12 DIAGNOSIS — R5383 Other fatigue: Secondary | ICD-10-CM | POA: Diagnosis not present

## 2017-01-24 ENCOUNTER — Other Ambulatory Visit: Payer: Self-pay | Admitting: Rheumatology

## 2017-01-24 MED ORDER — ETANERCEPT 50 MG/ML ~~LOC~~ SOAJ: 50.0000 mg | SUBCUTANEOUS | 0 refills | Status: DC

## 2017-01-24 NOTE — Telephone Encounter (Signed)
Patient uses [employer] program  Last Visit: 12/28/16 Next Visit: 02/10/17 Labs: 12/28/16 CBC WNL CMP Creat 1.09 And GFR 57  TB neg 03/15/16  Okay to refill ENbrel?

## 2017-01-24 NOTE — Telephone Encounter (Signed)
Patient called requesting a refill on her Enbrel.  She states that the company she goes through is only filling it 3 months at a time.  CB#[phone].  Thank you.

## 2017-02-08 ENCOUNTER — Telehealth: Payer: Self-pay | Admitting: Rheumatology

## 2017-02-08 DIAGNOSIS — I129 Hypertensive chronic kidney disease with stage 1 through stage 4 chronic kidney disease, or unspecified chronic kidney disease: Secondary | ICD-10-CM | POA: Diagnosis not present

## 2017-02-08 DIAGNOSIS — M069 Rheumatoid arthritis, unspecified: Secondary | ICD-10-CM | POA: Diagnosis not present

## 2017-02-08 DIAGNOSIS — Z8679 Personal history of other diseases of the circulatory system: Secondary | ICD-10-CM | POA: Insufficient documentation

## 2017-02-08 DIAGNOSIS — N183 Chronic kidney disease, stage 3 (moderate): Secondary | ICD-10-CM | POA: Diagnosis not present

## 2017-02-08 DIAGNOSIS — Z8719 Personal history of other diseases of the digestive system: Secondary | ICD-10-CM | POA: Insufficient documentation

## 2017-02-08 DIAGNOSIS — Z789 Other specified health status: Secondary | ICD-10-CM | POA: Diagnosis not present

## 2017-02-08 NOTE — Progress Notes (Signed)
Office Visit Note  Patient: Andrea [name]             Date of Birth: 1940-10-12           MRN: [id number]             PCP: Marco Collie, MD Referring: Marco Collie, MD Visit Date: 02/10/2017 Occupation: @GUAROCC @    Subjective:  Edema and Other of the Left Foot (Cramping); Edema and Other of the Right Foot (Cramping); Medication Management; and Results (Bone denisty scan)   History of Present Illness: Andrea [name] is a 76 y.o. female with history of sero positive rheumatoid arthritis. She states she's not had any joint pain or joint swelling. She has minimal stiffness in the morning. She's been experiencing some pedal edema lately. She had recent bone density and wants to discuss the results. Patient states that she was very sick in May and had possible pneumonia. She also felt extremely weak and her lower extremities. She is gradually recovered from that.  Activities of Daily Living:  Patient reports morning stiffness for 10 minutes.   Patient Denies nocturnal pain.  Difficulty dressing/grooming: Denies Difficulty climbing stairs: Denies Difficulty getting out of chair: Denies Difficulty using hands for taps, buttons, cutlery, and/or writing: Denies   Review of Systems  Constitutional: Negative for fatigue, night sweats, weight gain, weight loss and weakness.  HENT: Negative for mouth sores, trouble swallowing, trouble swallowing, mouth dryness and nose dryness.   Eyes: Negative for pain, redness, visual disturbance and dryness.  Respiratory: Negative for cough, shortness of breath and difficulty breathing.   Cardiovascular: Negative for chest pain, palpitations, hypertension, irregular heartbeat and swelling in legs/feet.  Gastrointestinal: Positive for constipation. Negative for blood in stool and diarrhea.  Endocrine: Negative for increased urination.  Genitourinary: Negative for vaginal dryness.  Musculoskeletal: Positive for morning stiffness. Negative for arthralgias,  joint pain, joint swelling, myalgias, muscle weakness, muscle tenderness and myalgias.  Skin: Negative for color change, rash, hair loss, skin tightness, ulcers and sensitivity to sunlight.  Allergic/Immunologic: Negative for susceptible to infections.  Neurological: Negative for dizziness, memory loss and night sweats.  Hematological: Negative for swollen glands.  Psychiatric/Behavioral: Negative for depressed mood and sleep disturbance. The patient is not nervous/anxious.     PMFS History:  Patient Active Problem List   Diagnosis Date Noted  . History of hypertension 02/08/2017  . History of gastroesophageal reflux (GERD) 02/08/2017  . High risk medication use 12/27/2016  . Vitamin D deficiency 12/27/2016  . History of total knee replacement, bilateral 08/27/2016  . Age-related osteoporosis without current pathological fracture 08/27/2016  . Seropositive rheumatoid arthritis of multiple sites ([place]) 06/21/2016    Past Medical History:  Diagnosis Date  . Arthritis   . Hypertension     History reviewed. No pertinent family history. Past Surgical History:  Procedure Laterality Date  . KNEE ARTHROPLASTY     Social History   Social History Narrative  . No narrative on file     Objective: Vital Signs: BP 135/73 (BP Location: Left Arm, Patient Position: Sitting, Cuff Size: Small)   Pulse (!) 59   Resp 12   Wt 168 lb (76.2 kg)   BMI 28.84 kg/m    Physical Exam  Constitutional: She is oriented to person, place, and time. She appears well-developed and well-nourished.  HENT:  Head: Normocephalic and atraumatic.  Eyes: Conjunctivae and EOM are normal.  Neck: Normal range of motion.  Cardiovascular: Normal rate, regular rhythm, normal heart sounds  and intact distal pulses.   Pedal edema bilateral lower extremities up to mid calf  Pulmonary/Chest: Effort normal and breath sounds normal.  Abdominal: Soft. Bowel sounds are normal.  Lymphadenopathy:    She has no cervical  adenopathy.  Neurological: She is alert and oriented to person, place, and time.  Skin: Skin is warm and dry. Capillary refill takes less than 2 seconds.  Psychiatric: She has a normal mood and affect. Her behavior is normal.  Nursing note and vitals reviewed.    Musculoskeletal Exam: C-spine and thoracic lumbar spine good range of motion. Shoulder joints although joints wrist joints are good range of motion. She has some synovial thickening over bilateral MCP joints. No synovitis was noted. Hip joints knee joints ankles MTPs PIPs with good range of motion with no synovitis.  CDAI Exam: CDAI Homunculus Exam:   Joint Counts:  CDAI Tender Joint count: 0 CDAI Swollen Joint count: 0  Global Assessments:  Patient Global Assessment: 3 Provider Global Assessment: 3  CDAI Calculated Score: 6    Investigation: Findings:  01/12/2017 DEXA -3.7 left distal radius / on  Reclast last infusion 06/2016   03/10/2016  Negative TB gold   CBC Latest Ref Rng & Units 12/28/2016 10/25/2016 08/30/2016  WBC 3.8 - 10.8 K/uL 4.9 4.0 3.5(L)  Hemoglobin 11.7 - 15.5 g/dL 12.2 12.5 12.6  Hematocrit 35.0 - 45.0 % 36.2 37.1 37.5  Platelets 140 - 400 K/uL 217 214 228    CMP Latest Ref Rng & Units 12/28/2016 10/25/2016 08/30/2016  Glucose 65 - 99 mg/dL 83 84 87  BUN 7 - 25 mg/dL 21 22 20   Creatinine 0.60 - 0.93 mg/dL 1.09(H) 1.09(H) 1.06(H)  Sodium 135 - 146 mmol/L 139 139 137  Potassium 3.5 - 5.3 mmol/L 3.9 4.1 3.6  Chloride 98 - 110 mmol/L 101 101 101  CO2 20 - 31 mmol/L 29 31 29   Calcium 8.6 - 10.4 mg/dL 9.4 9.6 9.2  Total Protein 6.1 - 8.1 g/dL 7.6 7.0 7.7  Total Bilirubin 0.2 - 1.2 mg/dL 0.4 0.5 0.5  Alkaline Phos 33 - 130 U/L 54 55 48  AST 10 - 35 U/L 18 17 21   ALT 6 - 29 U/L 10 10 12     Imaging: No results found.  Speciality Comments: No specialty comments available.    Procedures:  No procedures performed Allergies: Patient has no known allergies.   Assessment / Plan:     Visit Diagnoses:  Seropositive rheumatoid arthritis of multiple sites ([place]) - +RF,+anti CCP. Her rheumatoid arthritis seems to be very well controlled. We had detailed discussion regarding his pacing Enbrel to every other week and continuing on methotrexate on the current dose. We will check her labs every 3 months to monitor for drug toxicity. TB gold be checked on a yearly basis.  High risk medication use - Enbrel 50 mg sq qweek, Methotrexate 0.4 mL subcutaneous every week, Folic Acid 1 mg by mouth daily - Plan: Quantiferon tb gold assay (blood)  Pedal edema: I have advised her to schedule an appointment with her PCP  for evaluation. I discussed possible association of congestive heart failure with the anti-TNF's. She does not have any shortness of breath.  History of total knee replacement, bilateral: Doing well  Age-related osteoporosis without current pathological fracture - 01/12/2017 DEXA -3.7 left distal radius / on  Reclast last infusion 06/2016 (#3) patient has improvement in her BMDs and other sites but there was no comparison in the radius region. As she  has severe osteoporosis I discussed the option of trying Forteo. Indications side effects contraindications were discussed. We will apply for Forteo and see if the cost is reasonable.  Vitamin D deficiency: She is on supplement  History of hypertension  History of gastroesophageal reflux (GERD)    Orders: Orders Placed This Encounter  Procedures  . Quantiferon tb gold assay (blood)   No orders of the defined types were placed in this encounter.   Face-to-face time spent with patient was 30 minutes. 50% of time was spent in counseling and coordination of care.  Follow-Up Instructions: Return in about 5 months (around 07/13/2017) for Rheumatoid arthritis.   Bo Merino, MD  Note - This record has been created using Editor, commissioning.  Chart creation errors have been sought, but may not always  have been located. Such creation errors do  not reflect on  the standard of medical care.

## 2017-02-08 NOTE — Telephone Encounter (Signed)
Patient left a message yesterday stating that she has not received her refill on her Enbrell.  She stated in the message that you told her it was faxed on the 12th.  CB#[phone].  Thank you.

## 2017-02-09 NOTE — Telephone Encounter (Signed)
Spoke with patient and she was provided number of [employer] program. Patient states she will contact them to check status of prescription. Prescription was faxed on 01/24/17.

## 2017-02-10 ENCOUNTER — Encounter: Payer: Self-pay | Admitting: Rheumatology

## 2017-02-10 ENCOUNTER — Ambulatory Visit (INDEPENDENT_AMBULATORY_CARE_PROVIDER_SITE_OTHER): Payer: Medicare Other | Admitting: Rheumatology

## 2017-02-10 VITALS — BP 135/73 | HR 59 | Resp 12 | Wt 168.0 lb

## 2017-02-10 DIAGNOSIS — E559 Vitamin D deficiency, unspecified: Secondary | ICD-10-CM

## 2017-02-10 DIAGNOSIS — Z8719 Personal history of other diseases of the digestive system: Secondary | ICD-10-CM | POA: Diagnosis not present

## 2017-02-10 DIAGNOSIS — M0579 Rheumatoid arthritis with rheumatoid factor of multiple sites without organ or systems involvement: Secondary | ICD-10-CM | POA: Diagnosis not present

## 2017-02-10 DIAGNOSIS — Z96653 Presence of artificial knee joint, bilateral: Secondary | ICD-10-CM

## 2017-02-10 DIAGNOSIS — Z79899 Other long term (current) drug therapy: Secondary | ICD-10-CM

## 2017-02-10 DIAGNOSIS — M810 Age-related osteoporosis without current pathological fracture: Secondary | ICD-10-CM | POA: Diagnosis not present

## 2017-02-10 DIAGNOSIS — Z8679 Personal history of other diseases of the circulatory system: Secondary | ICD-10-CM | POA: Diagnosis not present

## 2017-02-10 NOTE — Progress Notes (Signed)
Pharmacy Note  Subjective: Patient presents today to the [facility] to see Dr. Estanislado Pandy.  Patient has been on intraveneous Reclast.  I was asked to see the patient regarding initiation of parathyroid hormone analog.  Patient's insurance was reviewed and it appears that Forteo is the formulary preferred parathyroid hormone analog.  Patient seen by the pharmacist for counseling on Forteo.    Objective: CMP     Component Value Date/Time   NA 139 12/28/2016 1208   K 3.9 12/28/2016 1208   CL 101 12/28/2016 1208   CO2 29 12/28/2016 1208   GLUCOSE 83 12/28/2016 1208   BUN 21 12/28/2016 1208   CREATININE 1.09 (H) 12/28/2016 1208   CALCIUM 9.4 12/28/2016 1208   PROT 7.6 12/28/2016 1208   ALBUMIN 3.8 12/28/2016 1208   AST 18 12/28/2016 1208   ALT 10 12/28/2016 1208   ALKPHOS 54 12/28/2016 1208   BILITOT 0.4 12/28/2016 1208   GFRNONAA 49 (L) 12/28/2016 1208   GFRAA 57 (L) 12/28/2016 1208   Assessment/Plan: Had detailed discussion of osteoporosis and goals of therapy.  Counseled patient on the purpose, proper use, and adverse effects of Forteo.  Discussed maximum use of parathyroid hormone analog of two years and then patient would need to restart another osteoporosis medication like Reclast.  Patient voiced understanding.  Patient denies any questions or concerns at this time.  Will apply for Forteo through patient's insurance.  Will update her once I know status of PA.  We also discussed use of [employer] Patient Assistance Program for Forteo if copay is too expensive.  Patient took patient portion of application.  She will bring it back if we decide to apply for foundation.   Elisabeth Most, Pharm.D., BCPS, CPP Clinical Pharmacist Pager: [phone] Phone: [phone] 02/10/2017 12:09 PM

## 2017-02-10 NOTE — Patient Instructions (Addendum)
Standing Labs We placed an order today for your standing lab work.    Please come back and get your standing labs in August and every 3 months  We have open lab Monday through Friday from 8:30-11:30 AM and 1:30-4 PM at the office of Dr. Tresa Moore, PA.   The office is located at [address] [place], [place], [place] [address] No appointment is necessary.   Labs are drawn by [facility].  You may receive a bill from [facility] for your lab work. If you have any questions regarding directions or hours of operation,  please call [phone].    Teriparatide injection What is this medicine? TERIPARATIDE (terr ih PAR a tyd) increases bone mass and strength. It helps make healthy bone and to slow bone loss. This medicine is used to prevent bone fractures. This medicine may be used for other purposes; ask your health care provider or pharmacist if you have questions. COMMON BRAND NAME(S): Forteo What should I tell my health care provider before I take this medicine? They need to know if you have any of these conditions: -bone disease other than osteoporosis -high levels of calcium in the blood -history of cancer in the bone -kidney stone -Paget's disease -parathyroid disease -receiving radiation therapy -an unusual or allergic reaction to teriparatide, other medicines, foods, dyes, or preservatives -pregnant or trying to get pregnant -breast-feeding How should I use this medicine? This medicine is for injection under the skin. You will be taught how to prepare and give this medicine. Use exactly as directed. Take your medicine at regular intervals. Do not take your medicine more often than directed. It is important that you put your used needles and pens in a special sharps container. Do not put them in a trash can. If you do not have a sharps container, call your pharmacist or health care provider to get one. A special MedGuide will be given to you by the pharmacist with  each prescription and refill. Be sure to read this information carefully each time. Talk to your pediatrician regarding the use of this medicine in children. Special care may be needed. Overdosage: If you think you have taken too much of this medicine contact a poison control center or emergency room at once. NOTE: This medicine is only for you. Do not share this medicine with others. What if I miss a dose? If you miss a dose, take it as soon as you can. If it is almost time for your next dose, take only that dose. Do not take double or extra doses. What may interact with this medicine? -digoxin This list may not describe all possible interactions. Give your health care provider a list of all the medicines, herbs, non-prescription drugs, or dietary supplements you use. Also tell them if you smoke, drink alcohol, or use illegal drugs. Some items may interact with your medicine. What should I watch for while using this medicine? Visit your doctor or health care professional for regular checks on your progress. Your doctor may order blood tests and other tests to see how you are doing. You should make sure you get enough calcium and vitamin D while you are taking this medicine, unless your doctor tells you not to. Discuss the foods you eat and the vitamins you take with your health care professional. Dennis Bast may get drowsy or dizzy. Do not drive, use machinery, or do anything that needs mental alertness until you know how this medicine affects you. Do not stand or sit  up quickly, especially if you are an older patient. This reduces the risk of dizzy or fainting spells. Talk to your doctor about your risk of cancer. You may be more at risk for certain types of cancers if you take this medicine. What side effects may I notice from receiving this medicine? Side effects that you should report to your doctor or health care professional as soon as possible: -allergic reactions like skin rash, itching or hives,  swelling of the face, lips, or tongue -blood in the urine; pain in the lower back or side; pain when urinating -signs and symptoms of low blood pressure like dizziness; feeling faint or lightheaded, falls; unusually weak or tired -signs and symptoms of increased calcium like nausea; vomiting; constipation; low energy; or muscle weakness Side effects that usually do not require medical attention (report these to your doctor or health care professional if they continue or are bothersome): -headache -joint pain -nausea -pain, redness, irritation or swelling at the injection site -stomach upset This list may not describe all possible side effects. Call your doctor for medical advice about side effects. You may report side effects to FDA at 1-800-FDA-1088. Where should I keep my medicine? Keep out of the reach of children. Store the pens in a refrigerator between 2 and 8 degrees C (36 and 46 degrees F). Do not freeze. Use the pen quickly after taking out of the refrigerator and recap and return to refrigerator right after using. Protect from light. Throw away any unused medicine 28 days after the first injection from the pen. Throw away any unused medicine after the expiration date on the label. NOTE: This sheet is a summary. It may not cover all possible information. If you have questions about this medicine, talk to your doctor, pharmacist, or health care provider.  2018 Elsevier/Gold Standard (2015-12-29 10:23:57)

## 2017-02-11 ENCOUNTER — Telehealth: Payer: Self-pay

## 2017-02-11 LAB — VITAMIN D 25 HYDROXY (VIT D DEFICIENCY, FRACTURES): Vit D, 25-Hydroxy: 49 ng/mL (ref 30–100)

## 2017-02-11 LAB — PTH, INTACT AND CALCIUM
Calcium: 9.4 mg/dL (ref 8.6–10.4)
PTH: 42 pg/mL (ref 14–64)

## 2017-02-11 NOTE — Progress Notes (Signed)
WNL

## 2017-02-11 NOTE — Telephone Encounter (Signed)
A prior authorization for Forteo was submitted via cover my meds. Will update once we receive an outcome.  Beverly Ferner, [place], CPhT 4:22 PM

## 2017-02-14 ENCOUNTER — Telehealth: Payer: Self-pay | Admitting: Pharmacist

## 2017-02-14 NOTE — Telephone Encounter (Signed)
Received denial from patient's insurance for Forteo.  We are trying to get patient assistance through the Forteo patient assistance program so patient can receive the medication for no charge.    I called patient to inform her of denial.  Advised we can still proceed with Forteo patient assistance application to see if she qualifies.  Patient reports she will bring her portion of application to the clinic next week or will mail it.  I provided her with mailing address.  We will submit application as soon as we receive patient's portion.    Elisabeth Most, Pharm.D., BCPS, CPP Clinical Pharmacist Pager: [phone] Phone: [phone] 02/14/2017 2:01 PM

## 2017-02-15 ENCOUNTER — Telehealth: Payer: Self-pay | Admitting: Radiology

## 2017-02-15 NOTE — Telephone Encounter (Signed)
I have called patient to advise labs are normal  

## 2017-02-15 NOTE — Telephone Encounter (Signed)
-----   Message from Bo Merino, MD sent at 02/11/2017 12:36 PM EDT ----- WNL

## 2017-02-21 DIAGNOSIS — R600 Localized edema: Secondary | ICD-10-CM | POA: Diagnosis not present

## 2017-02-21 DIAGNOSIS — Z6828 Body mass index (BMI) 28.0-28.9, adult: Secondary | ICD-10-CM | POA: Diagnosis not present

## 2017-02-21 DIAGNOSIS — R05 Cough: Secondary | ICD-10-CM | POA: Diagnosis not present

## 2017-02-21 DIAGNOSIS — J302 Other seasonal allergic rhinitis: Secondary | ICD-10-CM | POA: Diagnosis not present

## 2017-02-21 DIAGNOSIS — Z1389 Encounter for screening for other disorder: Secondary | ICD-10-CM | POA: Diagnosis not present

## 2017-02-21 DIAGNOSIS — R5383 Other fatigue: Secondary | ICD-10-CM | POA: Diagnosis not present

## 2017-02-21 DIAGNOSIS — Z139 Encounter for screening, unspecified: Secondary | ICD-10-CM | POA: Diagnosis not present

## 2017-02-22 DIAGNOSIS — M79609 Pain in unspecified limb: Secondary | ICD-10-CM | POA: Diagnosis not present

## 2017-02-22 DIAGNOSIS — I10 Essential (primary) hypertension: Secondary | ICD-10-CM | POA: Diagnosis not present

## 2017-02-22 DIAGNOSIS — R05 Cough: Secondary | ICD-10-CM | POA: Diagnosis not present

## 2017-02-22 DIAGNOSIS — J302 Other seasonal allergic rhinitis: Secondary | ICD-10-CM | POA: Diagnosis not present

## 2017-02-22 DIAGNOSIS — F3160 Bipolar disorder, current episode mixed, unspecified: Secondary | ICD-10-CM | POA: Diagnosis not present

## 2017-02-24 DIAGNOSIS — J302 Other seasonal allergic rhinitis: Secondary | ICD-10-CM | POA: Diagnosis not present

## 2017-02-24 DIAGNOSIS — R05 Cough: Secondary | ICD-10-CM | POA: Diagnosis not present

## 2017-02-25 DIAGNOSIS — J302 Other seasonal allergic rhinitis: Secondary | ICD-10-CM | POA: Diagnosis not present

## 2017-02-25 DIAGNOSIS — R05 Cough: Secondary | ICD-10-CM | POA: Diagnosis not present

## 2017-02-28 ENCOUNTER — Telehealth: Payer: Self-pay

## 2017-02-28 DIAGNOSIS — J302 Other seasonal allergic rhinitis: Secondary | ICD-10-CM | POA: Diagnosis not present

## 2017-02-28 DIAGNOSIS — R05 Cough: Secondary | ICD-10-CM | POA: Diagnosis not present

## 2017-02-28 NOTE — Telephone Encounter (Signed)
Called patient to see if she has submitted her portion of the [facility] patient assistance application. Patient states that she didn't feel like she would fall in the income qualifications. She wants to work on the application some and call us back. I informed her she can either mail it to the assistance program or she could bring it to the clinic for a faster response. Patient voiced understanding and denied any questions at this time.    Alletta Mattos, [place], CPhT 11:55 AM

## 2017-03-01 DIAGNOSIS — R05 Cough: Secondary | ICD-10-CM | POA: Diagnosis not present

## 2017-03-01 DIAGNOSIS — J302 Other seasonal allergic rhinitis: Secondary | ICD-10-CM | POA: Diagnosis not present

## 2017-03-01 NOTE — Telephone Encounter (Signed)
Patient came by the office to drop off her completed application and financial documents. The application was faxed to [facility] Patient [facility]. Will update once we receive a response.   Phone: [phone] Fax: [phone]  Demetrios Loll, CPhT 11:02 AM

## 2017-03-02 DIAGNOSIS — R05 Cough: Secondary | ICD-10-CM | POA: Diagnosis not present

## 2017-03-02 DIAGNOSIS — J302 Other seasonal allergic rhinitis: Secondary | ICD-10-CM | POA: Diagnosis not present

## 2017-03-03 ENCOUNTER — Telehealth: Payer: Self-pay | Admitting: Rheumatology

## 2017-03-03 DIAGNOSIS — J302 Other seasonal allergic rhinitis: Secondary | ICD-10-CM | POA: Diagnosis not present

## 2017-03-03 DIAGNOSIS — R05 Cough: Secondary | ICD-10-CM | POA: Diagnosis not present

## 2017-03-03 NOTE — Telephone Encounter (Signed)
Has this been sent or do I need to do this ?

## 2017-03-03 NOTE — Telephone Encounter (Signed)
Prescription portion of application was refaxed to [place], [place].D., BCPS, CPP Clinical Pharmacist Pager: [phone] Phone: [phone] 03/03/2017 3:48 PM

## 2017-03-03 NOTE — Telephone Encounter (Signed)
[place] from the [facility] called stating that they have received the application for the patient's Andrea [name], but they are needing a copy of the prescription faxed over to them. Fax [phone].  Thank you.

## 2017-03-04 DIAGNOSIS — J302 Other seasonal allergic rhinitis: Secondary | ICD-10-CM | POA: Diagnosis not present

## 2017-03-04 DIAGNOSIS — R05 Cough: Secondary | ICD-10-CM | POA: Diagnosis not present

## 2017-03-08 ENCOUNTER — Telehealth: Payer: Self-pay

## 2017-03-08 NOTE — Telephone Encounter (Signed)
[place] Patient Assistance to check the status of patient's application. Spoke with Haylee who states that all document from the application has been received. They need an actual hard copy of the Forteo Rx because it is a specialty medication and will be filled by a specialty pharmacy. After the Rx has been received, we will be notified by fax regarding their response.  Can you send an Rx for Forteo to [facility]? Thanks!   Fax: [phone]  Patient reference number: [id number].   Melquisedec Journey, [place], CPhT 8:43 AM

## 2017-03-14 MED ORDER — TERIPARATIDE (RECOMBINANT) 600 MCG/2.4ML ~~LOC~~ SOLN: 20.0000 ug | Freq: Every day | SUBCUTANEOUS | 2 refills | Status: DC

## 2017-03-14 NOTE — Addendum Note (Signed)
Addended by: Cyndia Skeeters on: 03/14/2017 09:21 AM   Modules accepted: Orders

## 2017-03-14 NOTE — Telephone Encounter (Signed)
Forteo prescription was printed and faxed to [facility].    Elisabeth Most, Pharm.D., BCPS, CPP Clinical Pharmacist Pager: [phone] Phone: [phone] 03/14/2017 9:21 AM

## 2017-03-18 NOTE — Telephone Encounter (Signed)
[place] to check the status of the application. Spoke to [place] who states that they are missing the Rx portion of the application. I ensured her that we submitted it on 7/17. A copy of the Rx was re-faxed to verified number [phone]. She suggests to check back on Tuesday, (7/31).   Called patient to update her. She states that she received a letter from the foundation regarding the missing information. I ensured her that the information has been submitted. We will contact her after we receive a response. Patient voiced understanding and denied any questions at this time.  Will check back and update once we receive a response.   Anicia Leuthold, [place], CPhT 10:14 AM

## 2017-03-21 ENCOUNTER — Other Ambulatory Visit: Payer: Self-pay

## 2017-03-22 ENCOUNTER — Telehealth: Payer: Self-pay

## 2017-03-22 NOTE — Telephone Encounter (Signed)
[place] to check the status of patient's application. Spoke to [place] who states that they are still missing the Rx portion of the application. I ensured her that we faxed a copy on 7/17 and on 7/27. She check the database and still could not see the faxed document. The call was transferred to Apolonio Schneiders, PharmD to give a verbal over the phone.   Case ID: [id number] Phone: [phone]  Andrea [name], CPhT 9:21 AM

## 2017-03-22 NOTE — Telephone Encounter (Signed)
Ok to start [employer].

## 2017-03-22 NOTE — Telephone Encounter (Signed)
I spoke to [facility].  Provided verbal order.  For Forteo, they prefer to mail 4 month supply at a time.  Provided prescription for Forteo 20 mcg daily #4 pens, 0 refills.  I was informed by Jonelle Sidle that patient was approved for the program and they will work on contacting her to schedule delivery of the medication.    I informed patient that medication was approved.  Patient will need nurse visit for education on initial injection.  Nurse visit scheduled for 03/25/17 at 10:00 AM.  Will start patient using Forteo sample pen.    Elisabeth Most, Pharm.D., BCPS, CPP Clinical Pharmacist Pager: [phone] Phone: [phone] 03/22/2017 9:37 AM

## 2017-03-25 ENCOUNTER — Ambulatory Visit (INDEPENDENT_AMBULATORY_CARE_PROVIDER_SITE_OTHER): Payer: Medicare Other | Admitting: *Deleted

## 2017-03-25 VITALS — BP 134/76 | HR 58

## 2017-03-25 DIAGNOSIS — Z79899 Other long term (current) drug therapy: Secondary | ICD-10-CM | POA: Diagnosis not present

## 2017-03-25 DIAGNOSIS — M810 Age-related osteoporosis without current pathological fracture: Secondary | ICD-10-CM | POA: Diagnosis not present

## 2017-03-25 LAB — CBC WITH DIFFERENTIAL/PLATELET
BASOS PCT: 0 %
Basophils Absolute: 0 cells/uL (ref 0–200)
EOS ABS: 152 {cells}/uL (ref 15–500)
Eosinophils Relative: 4 %
HEMATOCRIT: 36.5 % (ref 35.0–45.0)
Hemoglobin: 12.4 g/dL (ref 11.7–15.5)
Lymphocytes Relative: 46 %
Lymphs Abs: 1748 cells/uL (ref 850–3900)
MCH: 31.8 pg (ref 27.0–33.0)
MCHC: 34 g/dL (ref 32.0–36.0)
MCV: 93.6 fL (ref 80.0–100.0)
MONO ABS: 114 {cells}/uL — AB (ref 200–950)
MPV: 9.1 fL (ref 7.5–12.5)
Monocytes Relative: 3 %
NEUTROS ABS: 1786 {cells}/uL (ref 1500–7800)
Neutrophils Relative %: 47 %
PLATELETS: 215 10*3/uL (ref 140–400)
RBC: 3.9 MIL/uL (ref 3.80–5.10)
RDW: 13.9 % (ref 11.0–15.0)
WBC: 3.8 10*3/uL (ref 3.8–10.8)

## 2017-03-25 MED ORDER — TERIPARATIDE (RECOMBINANT) 600 MCG/2.4ML ~~LOC~~ SOLN
20.0000 ug | Freq: Once | SUBCUTANEOUS | Status: AC
  Administered 2017-03-25: 20 ug via SUBCUTANEOUS

## 2017-03-25 NOTE — Progress Notes (Signed)
Pharmacy Note  Subjective:   Patient is being initiated on Forteo.  Patient was previously counseled extensively on Forteo on 02/10/17 and agreed to initiation of Forteo at that time.  Patient presents to clinic today to receive the first dose of Forteo.  Patient confirms she received her shipment of Forteo yesterday.  Patient was given sample pen today to start Forteo.     Objective: CMP     Component Value Date/Time   NA 139 12/28/2016 1208   K 3.9 12/28/2016 1208   CL 101 12/28/2016 1208   CO2 29 12/28/2016 1208   GLUCOSE 83 12/28/2016 1208   BUN 21 12/28/2016 1208   CREATININE 1.09 (H) 12/28/2016 1208   CALCIUM 9.4 02/10/2017 1231   PROT 7.6 12/28/2016 1208   ALBUMIN 3.8 12/28/2016 1208   AST 18 12/28/2016 1208   ALT 10 12/28/2016 1208   ALKPHOS 54 12/28/2016 1208   BILITOT 0.4 12/28/2016 1208   GFRNONAA 49 (L) 12/28/2016 1208   GFRAA 57 (L) 12/28/2016 1208   Assessment/Plan:  Reviewed the purpose, proper use, and adverse effects of Forteo including maximum use of 2 years.  Patient was counseled on how to administer subcutaneous Forteo injection using a demonstration pen.  Patient received her first dose of Forteo.  Lot: [id number], Exp. 05/2018.  Patient tolerated the injection well.     Patient is due for standing labs and TB Gold at this time.  Labs drawn today while patient was in the office.    Patient has follow up scheduled for 07/19/2017.  Elisabeth Most, Pharm.D., BCPS Clinical Pharmacist Pager: [phone] Phone: [phone] 03/25/2017 10:30 AM

## 2017-03-25 NOTE — Progress Notes (Signed)
Patient in office for teaching on Forteo. Patient was provided with a sample. Patient was given her initial injection of Forteo  in the office. Patient tolerated injection well. Patient understood instructions and teaching.   Administrations This Visit    Teriparatide (Recombinant) SOLN 20 mcg    Admin Date 03/25/2017 Action Given Dose 20 mcg Route Subcutaneous Administered By Carole Binning, LPN

## 2017-03-26 LAB — COMPLETE METABOLIC PANEL WITH GFR
ALT: 12 U/L (ref 6–29)
AST: 21 U/L (ref 10–35)
Albumin: 3.8 g/dL (ref 3.6–5.1)
Alkaline Phosphatase: 48 U/L (ref 33–130)
BILIRUBIN TOTAL: 0.6 mg/dL (ref 0.2–1.2)
BUN: 20 mg/dL (ref 7–25)
CALCIUM: 9.5 mg/dL (ref 8.6–10.4)
CO2: 28 mmol/L (ref 20–31)
CREATININE: 1.1 mg/dL — AB (ref 0.60–0.93)
Chloride: 99 mmol/L (ref 98–110)
GFR, EST AFRICAN AMERICAN: 56 mL/min — AB (ref >=60)
GFR, EST NON AFRICAN AMERICAN: 49 mL/min — AB (ref >=60)
GLUCOSE: 88 mg/dL (ref 65–99)
Potassium: 3.5 mmol/L (ref 3.5–5.3)
Sodium: 138 mmol/L (ref 135–146)
TOTAL PROTEIN: 7.1 g/dL (ref 6.1–8.1)

## 2017-03-27 LAB — QUANTIFERON TB GOLD ASSAY (BLOOD)
Interferon Gamma Release Assay: NEGATIVE
Mitogen-Nil: >10 IU/mL
Quantiferon Nil Value: 0.02 IU/mL
Quantiferon Tb Ag Minus Nil Value: 0.02 IU/mL

## 2017-03-27 NOTE — Progress Notes (Signed)
WNL

## 2017-03-28 NOTE — Progress Notes (Signed)
Labs are stable.

## 2017-04-15 ENCOUNTER — Ambulatory Visit: Payer: PRIVATE HEALTH INSURANCE | Admitting: Rheumatology

## 2017-04-20 DIAGNOSIS — R7303 Prediabetes: Secondary | ICD-10-CM | POA: Diagnosis not present

## 2017-04-20 DIAGNOSIS — I129 Hypertensive chronic kidney disease with stage 1 through stage 4 chronic kidney disease, or unspecified chronic kidney disease: Secondary | ICD-10-CM | POA: Diagnosis not present

## 2017-04-20 DIAGNOSIS — E785 Hyperlipidemia, unspecified: Secondary | ICD-10-CM | POA: Diagnosis not present

## 2017-04-28 ENCOUNTER — Other Ambulatory Visit: Payer: Self-pay | Admitting: Rheumatology

## 2017-04-28 NOTE — Telephone Encounter (Signed)
Last Visit: 02/10/17 Next Visit: 07/19/17  Okay to refill per Dr. Deveshwar 

## 2017-05-02 DIAGNOSIS — I129 Hypertensive chronic kidney disease with stage 1 through stage 4 chronic kidney disease, or unspecified chronic kidney disease: Secondary | ICD-10-CM | POA: Diagnosis not present

## 2017-05-02 DIAGNOSIS — Z23 Encounter for immunization: Secondary | ICD-10-CM | POA: Diagnosis not present

## 2017-05-02 DIAGNOSIS — M069 Rheumatoid arthritis, unspecified: Secondary | ICD-10-CM | POA: Diagnosis not present

## 2017-05-02 DIAGNOSIS — E785 Hyperlipidemia, unspecified: Secondary | ICD-10-CM | POA: Diagnosis not present

## 2017-05-02 DIAGNOSIS — N183 Chronic kidney disease, stage 3 (moderate): Secondary | ICD-10-CM | POA: Diagnosis not present

## 2017-05-31 ENCOUNTER — Ambulatory Visit: Payer: Medicare Other | Admitting: Rheumatology

## 2017-06-20 ENCOUNTER — Other Ambulatory Visit: Payer: Self-pay | Admitting: Rheumatology

## 2017-06-21 NOTE — Telephone Encounter (Signed)
Last Visit: 02/10/17 Next Visit: 07/19/17 Labs: 03/25/17 Stable  Okay to refill per Dr. Estanislado Pandy

## 2017-06-28 ENCOUNTER — Telehealth: Payer: Self-pay

## 2017-06-28 ENCOUNTER — Other Ambulatory Visit: Payer: Self-pay

## 2017-06-28 DIAGNOSIS — Z79899 Other long term (current) drug therapy: Secondary | ICD-10-CM

## 2017-06-28 LAB — CBC WITH DIFFERENTIAL/PLATELET
BASOS ABS: 28 {cells}/uL (ref 0–200)
Basophils Relative: 0.5 %
EOS ABS: 220 {cells}/uL (ref 15–500)
Eosinophils Relative: 4 %
HEMATOCRIT: 34.8 % — AB (ref 35.0–45.0)
Hemoglobin: 11.9 g/dL (ref 11.7–15.5)
LYMPHS ABS: 1975 {cells}/uL (ref 850–3900)
MCH: 31.7 pg (ref 27.0–33.0)
MCHC: 34.2 g/dL (ref 32.0–36.0)
MCV: 92.8 fL (ref 80.0–100.0)
MPV: 9.8 fL (ref 7.5–12.5)
Monocytes Relative: 8.6 %
Neutro Abs: 2805 cells/uL (ref 1500–7800)
Neutrophils Relative %: 51 %
Platelets: 211 10*3/uL (ref 140–400)
RBC: 3.75 10*6/uL — ABNORMAL LOW (ref 3.80–5.10)
RDW: 12.5 % (ref 11.0–15.0)
Total Lymphocyte: 35.9 %
WBC: 5.5 10*3/uL (ref 3.8–10.8)
WBCMIX: 473 {cells}/uL (ref 200–950)

## 2017-06-28 LAB — COMPLETE METABOLIC PANEL WITH GFR
AG RATIO: 1.1 (calc) (ref 1.0–2.5)
ALBUMIN MSPROF: 3.8 g/dL (ref 3.6–5.1)
ALT: 11 U/L (ref 6–29)
AST: 15 U/L (ref 10–35)
Alkaline phosphatase (APISO): 72 U/L (ref 33–130)
BILIRUBIN TOTAL: 0.4 mg/dL (ref 0.2–1.2)
BUN / CREAT RATIO: 20 (calc) (ref 6–22)
BUN: 22 mg/dL (ref 7–25)
CHLORIDE: 100 mmol/L (ref 98–110)
CO2: 31 mmol/L (ref 20–32)
Calcium: 9.9 mg/dL (ref 8.6–10.4)
Creat: 1.09 mg/dL — ABNORMAL HIGH (ref 0.60–0.93)
GFR, EST AFRICAN AMERICAN: 57 mL/min/{1.73_m2} — AB (ref >=60)
GFR, EST NON AFRICAN AMERICAN: 49 mL/min/{1.73_m2} — AB (ref >=60)
Globulin: 3.5 g/dL (calc) (ref 1.9–3.7)
Glucose, Bld: 73 mg/dL (ref 65–99)
Potassium: 3.9 mmol/L (ref 3.5–5.3)
Sodium: 139 mmol/L (ref 135–146)
TOTAL PROTEIN: 7.3 g/dL (ref 6.1–8.1)

## 2017-06-28 MED ORDER — ETANERCEPT 50 MG/ML ~~LOC~~ SOAJ: 50.0000 mg | SUBCUTANEOUS | 0 refills | Status: DC

## 2017-06-28 NOTE — Addendum Note (Signed)
Addended by: Carole Binning on: 06/28/2017 02:45 PM   Modules accepted: Orders

## 2017-06-28 NOTE — Telephone Encounter (Signed)
Last Visit: 02/10/17  Next Visit: 07/19/17 Labs: 03/24/17 Stable TB Gold: 03/25/17 Neg  Okay to refill per Dr. Estanislado Pandy  Prescription faxed to [facility]

## 2017-06-28 NOTE — Telephone Encounter (Signed)
Patient would like Rx for Enbrel refilled for the rest of this year sent through [facility].  Had lab work drawn 06/28/17.  Cb# [phone].  Please advise.

## 2017-06-29 NOTE — Progress Notes (Signed)
stable °

## 2017-06-30 ENCOUNTER — Telehealth: Payer: Self-pay

## 2017-06-30 NOTE — Telephone Encounter (Signed)
Patient brought application in clinic. Provider portion completed and application was faxed to foundation.   Will update once we receive a response.   Nimsi Males, [place], CPhT 8:39 AM

## 2017-07-07 ENCOUNTER — Telehealth: Payer: Self-pay

## 2017-07-07 NOTE — Telephone Encounter (Signed)
Patient would like to know if she is able to get a sample of Enbrel due to her Rx not being able to be delivered until next week.  Patient spoke with [employer] concerning Rx and was told that there was a delay.  Cb# is [phone].  Please advise.  Thank You.

## 2017-07-08 ENCOUNTER — Telehealth: Payer: Self-pay | Admitting: *Deleted

## 2017-07-08 NOTE — Telephone Encounter (Signed)
Patient was came by office and was given a sample of Enbrel.

## 2017-07-08 NOTE — Telephone Encounter (Signed)
Medication Samples have been provided to the patient.  Drug name: Enbrel       Strength: 50 mg       Qty: 1  LOT: [id number]  Exp.Date: 02/2019  Dosing instructions: Inject one pen once weekly  The patient has been instructed regarding the correct time, dose, and frequency of taking this medication, including desired effects and most common side effects.   Gwenlyn Perking 10:46 AM 07/08/2017

## 2017-07-09 NOTE — Progress Notes (Addendum)
Office Visit Note  Patient: Andrea [name]             Date of Birth: 1940-12-21           MRN: [id number]             PCP: Marco Collie, MD Referring: Marco Collie, MD Visit Date: 07/19/2017 Occupation: @GUAROCC @    Subjective:  Arthritis (neck pain, right knee pain )   History of Present Illness: Andrea [name] is a 76 y.o. female with history of sero positive rheumatoid arthritis and osteoarthritis. She states she's been having some discomfort in her left trapezius area. She's been also having pain in her right knee joint which is been replaced. She denies any joint swelling. She was a started on Forteo for osteoporosis in August. She's been tolerating the injections well.  Activities of Daily Living:  Patient reports morning stiffness for 0 minute.   Patient Denies nocturnal pain.  Difficulty dressing/grooming: Reports Difficulty climbing stairs: Reports Difficulty getting out of chair: Reports Difficulty using hands for taps, buttons, cutlery, and/or writing: Denies   Review of Systems  Constitutional: Negative for fatigue, night sweats, weight gain, weight loss and weakness.  HENT: Negative for mouth sores, trouble swallowing, trouble swallowing, mouth dryness and nose dryness.   Eyes: Negative for pain, redness, visual disturbance and dryness.  Respiratory: Negative for cough, shortness of breath and difficulty breathing.   Cardiovascular: Negative for chest pain, palpitations, hypertension, irregular heartbeat and swelling in legs/feet.  Gastrointestinal: Positive for constipation. Negative for blood in stool and diarrhea.  Endocrine: Negative for increased urination.  Genitourinary: Negative for vaginal dryness.  Musculoskeletal: Positive for arthralgias and joint pain. Negative for joint swelling, myalgias, muscle weakness, morning stiffness, muscle tenderness and myalgias.  Skin: Negative for color change, rash, hair loss, skin tightness, ulcers and sensitivity to  sunlight.  Allergic/Immunologic: Negative for susceptible to infections.  Neurological: Negative for dizziness, memory loss and night sweats.  Hematological: Negative for swollen glands.  Psychiatric/Behavioral: Positive for sleep disturbance. Negative for depressed mood. The patient is not nervous/anxious.     PMFS History:  Patient Active Problem List   Diagnosis Date Noted  . History of hypertension 02/08/2017  . History of gastroesophageal reflux (GERD) 02/08/2017  . High risk medication use 12/27/2016  . Vitamin D deficiency 12/27/2016  . History of total knee replacement, bilateral 08/27/2016  . Age-related osteoporosis without current pathological fracture 08/27/2016  . Seropositive rheumatoid arthritis of multiple sites ([place]) 06/21/2016    Past Medical History:  Diagnosis Date  . Arthritis   . Hypertension     Family History  Problem Relation Age of Onset  . Kidney disease Mother   . Cancer Father   . Stroke Brother   . Lung disease Brother   . Rheum arthritis Daughter   . Rheum arthritis Daughter    Past Surgical History:  Procedure Laterality Date  . JOINT REPLACEMENT     BIL knee   . KNEE ARTHROPLASTY     Social History   Social History Narrative  . Not on file     Objective: Vital Signs: BP 139/71 (BP Location: Left Arm, Patient Position: Sitting, Cuff Size: Normal)   Pulse 62   Resp 17   Ht 5\' 4"  (1.626 m)   Wt 167 lb (75.8 kg)   BMI 28.67 kg/m    Physical Exam  Constitutional: She is oriented to person, place, and time. She appears well-developed and well-nourished.  HENT:  Head:  Normocephalic and atraumatic.  Eyes: Conjunctivae and EOM are normal.  Neck: Normal range of motion.  Cardiovascular: Normal rate, regular rhythm, normal heart sounds and intact distal pulses.  Pulmonary/Chest: Effort normal and breath sounds normal.  Abdominal: Soft. Bowel sounds are normal.  Lymphadenopathy:    She has no cervical adenopathy.  Neurological: She  is alert and oriented to person, place, and time.  Skin: Skin is warm and dry. Capillary refill takes less than 2 seconds.  Psychiatric: She has a normal mood and affect. Her behavior is normal.  Nursing note and vitals reviewed.    Musculoskeletal Exam: C-spine and thoracic lumbar spine good range of motion. She had left trapezius is spasm on palpation. Shoulder joints elbow joints are good range of motion. She has limited range of motion of her bilateral wrist joints with synovial thickening. She had ulnar deviation of bilateral MCP joint with MCP thickening. No synovitis was noted. Hip joints are good range of motion. She has bilateral total knee replacement which is causing some discomfort. No ankle joint synovitis was noted.  CDAI Exam: CDAI Homunculus Exam:   Joint Counts:  CDAI Tender Joint count: 0 CDAI Swollen Joint count: 0  Global Assessments:  Patient Global Assessment: 3 Provider Global Assessment: 3  CDAI Calculated Score: 6    Investigation: No additional findings.Tb Gold: 03/25/2017 Negative  CBC Latest Ref Rng & Units 06/28/2017 03/25/2017 12/28/2016  WBC 3.8 - 10.8 Thousand/uL 5.5 3.8 4.9  Hemoglobin 11.7 - 15.5 g/dL 11.9 12.4 12.2  Hematocrit 35.0 - 45.0 % 34.8(L) 36.5 36.2  Platelets 140 - 400 Thousand/uL 211 215 217   CMP Latest Ref Rng & Units 06/28/2017 03/25/2017 02/10/2017  Glucose 65 - 99 mg/dL 73 88 -  BUN 7 - 25 mg/dL 22 20 -  Creatinine 0.60 - 0.93 mg/dL 1.09(H) 1.10(H) -  Sodium 135 - 146 mmol/L 139 138 -  Potassium 3.5 - 5.3 mmol/L 3.9 3.5 -  Chloride 98 - 110 mmol/L 100 99 -  CO2 20 - 32 mmol/L 31 28 -  Calcium 8.6 - 10.4 mg/dL 9.9 9.5 9.4  Total Protein 6.1 - 8.1 g/dL 7.3 7.1 -  Total Bilirubin 0.2 - 1.2 mg/dL 0.4 0.6 -  Alkaline Phos 33 - 130 U/L - 48 -  AST 10 - 35 U/L 15 21 -  ALT 6 - 29 U/L 11 12 -    Imaging: No results found.  Speciality Comments: TB Gold: 03/25/17 Neg    Procedures:  Trigger Point Inj Date/Time: 07/19/2017 12:04  PM Performed by: Bo Merino, MD Authorized by: Bo Merino, MD   Consent Given by:  Patient Site marked: the procedure site was marked   Timeout: prior to procedure the correct patient, procedure, and site was verified   Indications:  Muscle spasm and pain Total # of Trigger Points:  1 Location: neck   Needle Size:  27 G Approach:  Dorsal Medications #1:  0.5 mL lidocaine 1 %; 10 mg triamcinolone acetonide 40 MG/ML Patient tolerance:  Patient tolerated the procedure well with no immediate complications    Allergies: Patient has no known allergies.   Assessment / Plan:     Visit Diagnoses: Seropositive rheumatoid arthritis of multiple sites ([place]) - +RF,+anti CCP. Patient had no synovitis on examination today. She's been tolerating her medications well.  High risk medication use - Enbrel 50 mg sq qweek, Methotrexate 0.4 mL subcutaneous every week, Folic Acid 1 mg by mouth daily. Her labs are stable.  Neck  pain: She had left trapezius is spasm. Per her request the left trapezius area was prepped and injected with cortisone procedures described above.  History of total knee replacement, bilateral: She has some chronic discomfort but had no synovitis.  Age-related osteoporosis without current pathological fracture - 01/12/2017 DEXA -3.7 left distal radius / on  Reclast last infusion 06/2016 (#3). Forteo subcutaneous daily initiated 03/25/2017. She is tolerating Forteo well she will take Forteo for 2 years.  History of hypertension: Her blood pressure is slightly elevated. She's been followed by her PCP  Vitamin D deficiency - She is on supplement  History of gastroesophageal reflux (GERD)    Orders: Orders Placed This Encounter  Procedures  . Trigger Point Inj   Meds ordered this encounter  Medications  . lidocaine (XYLOCAINE) 1 % (with pres) injection 0.5 mL  . triamcinolone acetonide (KENALOG-40) injection 10 mg      Follow-Up Instructions: Return in about 5  months (around 12/17/2017) for Rheumatoid arthritis, Osteoarthritis.   Bo Merino, MD  Note - This record has been created using Editor, commissioning.  Chart creation errors have been sought, but may not always  have been located. Such creation errors do not reflect on  the standard of medical care.

## 2017-07-12 NOTE — Telephone Encounter (Addendum)
Called foundation to check the status of patients application. Spoke to [place] who states that the application was submitted by patient on a 2017 form. She will need to submit an application on an updated form in order for it to be processed for 2019.   Amna Welker, [place], CPhT 1:55 PM

## 2017-07-19 ENCOUNTER — Ambulatory Visit (INDEPENDENT_AMBULATORY_CARE_PROVIDER_SITE_OTHER): Payer: Medicare Other | Admitting: Rheumatology

## 2017-07-19 ENCOUNTER — Encounter: Payer: Self-pay | Admitting: Rheumatology

## 2017-07-19 VITALS — BP 139/71 | HR 62 | Resp 17 | Ht 64.0 in | Wt 167.0 lb

## 2017-07-19 DIAGNOSIS — Z96653 Presence of artificial knee joint, bilateral: Secondary | ICD-10-CM

## 2017-07-19 DIAGNOSIS — Z8679 Personal history of other diseases of the circulatory system: Secondary | ICD-10-CM | POA: Diagnosis not present

## 2017-07-19 DIAGNOSIS — E559 Vitamin D deficiency, unspecified: Secondary | ICD-10-CM

## 2017-07-19 DIAGNOSIS — Z8719 Personal history of other diseases of the digestive system: Secondary | ICD-10-CM | POA: Diagnosis not present

## 2017-07-19 DIAGNOSIS — M810 Age-related osteoporosis without current pathological fracture: Secondary | ICD-10-CM

## 2017-07-19 DIAGNOSIS — Z79899 Other long term (current) drug therapy: Secondary | ICD-10-CM | POA: Diagnosis not present

## 2017-07-19 DIAGNOSIS — M0579 Rheumatoid arthritis with rheumatoid factor of multiple sites without organ or systems involvement: Secondary | ICD-10-CM

## 2017-07-19 DIAGNOSIS — M542 Cervicalgia: Secondary | ICD-10-CM | POA: Diagnosis not present

## 2017-07-19 MED ORDER — LIDOCAINE HCL 1 % IJ SOLN
0.5000 mL | INTRAMUSCULAR | Status: AC | PRN
  Administered 2017-07-19: .5 mL

## 2017-07-19 MED ORDER — TRIAMCINOLONE ACETONIDE 40 MG/ML IJ SUSP
10.0000 mg | INTRAMUSCULAR | Status: AC | PRN
  Administered 2017-07-19: 10 mg via INTRAMUSCULAR

## 2017-07-19 NOTE — Patient Instructions (Addendum)
Standing Labs We placed an order today for your standing lab work.    Please come back and get your standing labs in February and every 3 months  We have open lab Monday through Friday from 8:30-11:30 AM and 1:30-4 PM at the office of Dr. Kimmora Risenhoover.   The office is located at [address], [address], [place], [place] [address] No appointment is necessary.   Labs are drawn by [facility].  You may receive a bill from [facility] for your lab work. If you have any questions regarding directions or hours of operation,  please call [phone].    

## 2017-07-20 NOTE — Telephone Encounter (Signed)
Spoke to patient about her expired application. She plans to come by the clinic on Monday 07/25/17 to sign and complete updated application for 7225.   Will fax document once completed.   Yadriel Kerrigan, [place], CPhT 4:11 PM

## 2017-07-21 ENCOUNTER — Telehealth: Payer: Self-pay

## 2017-07-21 NOTE — Telephone Encounter (Signed)
Patient provided her renewal application for Forteo patient assistance through [employer]. Application was completed and faxed on 07/19/2017. Will update once we receive a response.   Patient voices understanding and denies any questions at this time.   Jamail Cullers, [place], CPhT 4:04 PM

## 2017-07-25 ENCOUNTER — Telehealth: Payer: Self-pay

## 2017-07-25 NOTE — Telephone Encounter (Signed)
A prior authorization for Enbrel is required for [employer]. Authorization was submitted to pts insurance via cover my meds.   Received a fax from [facility] regarding a prior authorization [place] for ENBREL  Reference number:NONE Phone number:[phone]  Will send document to scan center and [place], [place], CPhT  11:32 AM

## 2017-08-03 ENCOUNTER — Telehealth: Payer: Self-pay

## 2017-08-03 NOTE — Telephone Encounter (Signed)
Tyson with [employer], would like to know if an appeal has been filed through [employer] being that the PA for Enbrel was denied.  Cb# is [phone].  Please advise.  Thank you.

## 2017-08-05 NOTE — Telephone Encounter (Signed)
Returned call. Spoke to [place] to let her know that I submitted the appeal through cover my meds today. This is required for patient assistance application. Will update once we receive a response.   Montrey Buist, [place], CPhT 12:08 PM

## 2017-08-09 NOTE — Telephone Encounter (Signed)
Received a fax from [facility] regarding the appeal stating that the patient is still denied coverage for Enbrel [place] because the member is utilizing a pharmaceutical assistance program to obtain the medication. According to CMS guidelines, a Medicare provider isn't responsible for coverage of a medication that is being supplied through a pharmaceutical assistance program. We can ask for a review of the case by [employer] (an independent review Chief Strategy Officer for the [employer] for [employer] and [employer]. It must be submitted within 60 days calendar days of the decision.   Called [employer] to update. Spoke with Naaman Plummer who states that a final appeal must be exhausted before the application can be processed for 2019.   Please send appeal letter to:  [facility] [address] [place] [address] Toll-Free fax: [phone] Fax: [phone]  Please include office name, address, member ID, reasons for appeal and any evidence that supports request. Thanks!  Reference number: [id number] Member ID: [id number]

## 2017-08-10 NOTE — Telephone Encounter (Signed)
Received a fax from [facility] regarding a prior authorization approval for ENBREL through 01/02/2018.   Reference number: [id number] Phone number: [phone]  Will send document to scan center.  Sent a copy to [employer] for documentation and review for assistance for 2019.  Fax: [phone]  Demetrios Loll, CPhT  8:28 AM

## 2017-08-18 ENCOUNTER — Telehealth: Payer: Self-pay

## 2017-08-18 NOTE — Telephone Encounter (Signed)
[place] to check the status of patients application. Spoke with Stanton Kidney who states that the application has been received and is still being processed. They will need a copy of the Rx sent to the foundation because the specialty pharmacy will not be able to use the information off of the application. Please send a copy of the Rx for Forteo to the foundation at [phone]. Thanks!  Will update once we receive a response.   Iridian Reader, [place], CPhT 3:34 PM

## 2017-08-22 ENCOUNTER — Other Ambulatory Visit: Payer: Self-pay | Admitting: Rheumatology

## 2017-08-22 MED ORDER — TERIPARATIDE (RECOMBINANT) 600 MCG/2.4ML ~~LOC~~ SOLN: 20.0000 ug | Freq: Every day | SUBCUTANEOUS | 2 refills | Status: DC

## 2017-08-22 NOTE — Telephone Encounter (Signed)
Prescription faxed to [facility].

## 2017-08-22 NOTE — Telephone Encounter (Signed)
Last Visit: 07/19/17 Next Visit: 12/19/17 Labs: 06/28/17 stable  Okay to refill per Dr. Estanislado Pandy

## 2017-09-01 ENCOUNTER — Telehealth: Payer: Self-pay | Admitting: Rheumatology

## 2017-09-01 NOTE — Telephone Encounter (Signed)
Patient left a voicemail stating she had questions about her prescription of Enbrel.  Her CB# [phone]

## 2017-09-01 NOTE — Telephone Encounter (Signed)
Spoke with pt. She was checking the status of her patient assistance applications. She received an approval letter for Enbrel with [employer]. We have to submit a pa to satisfy the [employer] application requirements. She has enough Enbrel for 4 weeks. She is also waiting to hear back from [facility] Patient Assistance. She is all out of medication. Per LPN, Gwenlyn Perking, we can we provide a sample of [place] until we have a response from assistance program. Patient can pick up tomorrow.   Mozetta Murfin, [place], CPhT 2:52 PM

## 2017-09-02 NOTE — Telephone Encounter (Signed)
Medication Samples have been provided to the patient.  Drug name: Forteo       Strength: 91mcg       Qty: 1 pen LOT: [id number] e Exp.Date: 06/2018  Dosing instructions: inject 57mcg once daily as directed  The patient has been instructed regarding the correct time, dose, and frequency of taking this medication, including desired effects and most common side effects.   Signed out by [facility].   [place], [place] 12:07 PM 09/02/2017

## 2017-09-07 ENCOUNTER — Telehealth: Payer: Self-pay

## 2017-09-07 NOTE — Telephone Encounter (Signed)
Called [employer] to check status of application. Spoke with Dasia who states that they did not receive the faxed copy of the approval letter to a secure back line fax [phone]. Will update once we receive a response.  Gurnoor Ursua, [place], CPhT 10:54 AM

## 2017-09-09 DIAGNOSIS — I129 Hypertensive chronic kidney disease with stage 1 through stage 4 chronic kidney disease, or unspecified chronic kidney disease: Secondary | ICD-10-CM | POA: Diagnosis not present

## 2017-09-09 DIAGNOSIS — E785 Hyperlipidemia, unspecified: Secondary | ICD-10-CM | POA: Diagnosis not present

## 2017-09-12 ENCOUNTER — Telehealth: Payer: Self-pay

## 2017-09-12 NOTE — Telephone Encounter (Signed)
[place] to check the status of pts application. Spoke with Hassan Rowan who states that the application was only receive in parts. They are missing pt consent, proof of income, prescriber portion and a copy of the Rx. Re-faxed the entire application to foundation. She could not give [place] a turnaround time. Will check back and update once we have a response.   Raylene Carmickle, [place], CPhT 4:24 PM

## 2017-09-16 DIAGNOSIS — Z139 Encounter for screening, unspecified: Secondary | ICD-10-CM | POA: Diagnosis not present

## 2017-09-16 DIAGNOSIS — I129 Hypertensive chronic kidney disease with stage 1 through stage 4 chronic kidney disease, or unspecified chronic kidney disease: Secondary | ICD-10-CM | POA: Diagnosis not present

## 2017-09-16 DIAGNOSIS — N183 Chronic kidney disease, stage 3 (moderate): Secondary | ICD-10-CM | POA: Diagnosis not present

## 2017-09-16 DIAGNOSIS — E785 Hyperlipidemia, unspecified: Secondary | ICD-10-CM | POA: Diagnosis not present

## 2017-09-16 DIAGNOSIS — Z1331 Encounter for screening for depression: Secondary | ICD-10-CM | POA: Diagnosis not present

## 2017-09-16 DIAGNOSIS — Z6827 Body mass index (BMI) 27.0-27.9, adult: Secondary | ICD-10-CM | POA: Diagnosis not present

## 2017-09-16 DIAGNOSIS — Z0000 Encounter for general adult medical examination without abnormal findings: Secondary | ICD-10-CM | POA: Diagnosis not present

## 2017-09-27 ENCOUNTER — Telehealth: Payer: Self-pay

## 2017-09-27 NOTE — Telephone Encounter (Signed)
Received a fax from [employer] stating that the pt has been approved to receive Enbrel free of charge from 09/27/2017 through 08/22/2018.   Will send document to scan center.   Called pt to update. She used her last pen today. She will call the foundation to set up shipment today.   Lidia Clavijo, [place], CPhT 2:21 PM

## 2017-10-04 NOTE — Telephone Encounter (Signed)
Called foundation to check the status of pts application. Spoke with Hassan Rowan who states that pt has been approved from 09/27/2017 through 08/22/2018. They will reach out to the pt to schedule shipment. An approval letter will be faxed to the clinic and mailed to the pt. Will send to scan center once received.  Called pt to update. Left message.   4:37 PM Keldrick Pomplun, [place], CPhT

## 2017-10-05 DIAGNOSIS — I129 Hypertensive chronic kidney disease with stage 1 through stage 4 chronic kidney disease, or unspecified chronic kidney disease: Secondary | ICD-10-CM | POA: Diagnosis not present

## 2017-10-05 DIAGNOSIS — N183 Chronic kidney disease, stage 3 (moderate): Secondary | ICD-10-CM | POA: Diagnosis not present

## 2017-10-05 DIAGNOSIS — Z789 Other specified health status: Secondary | ICD-10-CM | POA: Diagnosis not present

## 2017-10-05 DIAGNOSIS — E785 Hyperlipidemia, unspecified: Secondary | ICD-10-CM | POA: Diagnosis not present

## 2017-10-14 DIAGNOSIS — K210 Gastro-esophageal reflux disease with esophagitis: Secondary | ICD-10-CM | POA: Diagnosis not present

## 2017-10-20 DIAGNOSIS — Z6827 Body mass index (BMI) 27.0-27.9, adult: Secondary | ICD-10-CM | POA: Diagnosis not present

## 2017-10-20 DIAGNOSIS — J101 Influenza due to other identified influenza virus with other respiratory manifestations: Secondary | ICD-10-CM | POA: Diagnosis not present

## 2017-10-20 DIAGNOSIS — R05 Cough: Secondary | ICD-10-CM | POA: Diagnosis not present

## 2017-10-21 ENCOUNTER — Other Ambulatory Visit: Payer: Self-pay | Admitting: Rheumatology

## 2017-10-24 NOTE — Telephone Encounter (Addendum)
Last Visit: 07/19/17 Next Visit: 12/19/17 Labs: 06/28/17 stable  Patient advised she is due for labs. Patient will update labs at the end of the week.   Okay to refill 30 day supply per Dr. Estanislado Pandy

## 2017-11-02 ENCOUNTER — Other Ambulatory Visit: Payer: Self-pay

## 2017-11-02 DIAGNOSIS — Z79899 Other long term (current) drug therapy: Secondary | ICD-10-CM

## 2017-11-03 LAB — CBC WITH DIFFERENTIAL/PLATELET
BASOS PCT: 0.5 %
Basophils Absolute: 29 cells/uL (ref 0–200)
EOS PCT: 5.1 %
Eosinophils Absolute: 291 cells/uL (ref 15–500)
HCT: 35.5 % (ref 35.0–45.0)
Hemoglobin: 12.2 g/dL (ref 11.7–15.5)
Lymphs Abs: 1796 cells/uL (ref 850–3900)
MCH: 31.4 pg (ref 27.0–33.0)
MCHC: 34.4 g/dL (ref 32.0–36.0)
MCV: 91.5 fL (ref 80.0–100.0)
MONOS PCT: 10.8 %
MPV: 9.4 fL (ref 7.5–12.5)
NEUTROS PCT: 52.1 %
Neutro Abs: 2970 cells/uL (ref 1500–7800)
PLATELETS: 257 10*3/uL (ref 140–400)
RBC: 3.88 10*6/uL (ref 3.80–5.10)
RDW: 12.5 % (ref 11.0–15.0)
Total Lymphocyte: 31.5 %
WBC mixed population: 616 cells/uL (ref 200–950)
WBC: 5.7 10*3/uL (ref 3.8–10.8)

## 2017-11-03 LAB — COMPLETE METABOLIC PANEL WITH GFR
AG Ratio: 1.1 (calc) (ref 1.0–2.5)
ALKALINE PHOSPHATASE (APISO): 60 U/L (ref 33–130)
ALT: 8 U/L (ref 6–29)
AST: 16 U/L (ref 10–35)
Albumin: 3.7 g/dL (ref 3.6–5.1)
BUN / CREAT RATIO: 16 (calc) (ref 6–22)
BUN: 23 mg/dL (ref 7–25)
CO2: 30 mmol/L (ref 20–32)
CREATININE: 1.42 mg/dL — AB (ref 0.60–0.93)
Calcium: 9.7 mg/dL (ref 8.6–10.4)
Chloride: 100 mmol/L (ref 98–110)
GFR, EST AFRICAN AMERICAN: 41 mL/min/{1.73_m2} — AB (ref >=60)
GFR, EST NON AFRICAN AMERICAN: 35 mL/min/{1.73_m2} — AB (ref >=60)
GLOBULIN: 3.4 g/dL (ref 1.9–3.7)
Glucose, Bld: 76 mg/dL (ref 65–99)
Potassium: 3.5 mmol/L (ref 3.5–5.3)
SODIUM: 139 mmol/L (ref 135–146)
TOTAL PROTEIN: 7.1 g/dL (ref 6.1–8.1)
Total Bilirubin: 0.4 mg/dL (ref 0.2–1.2)

## 2017-11-03 NOTE — Progress Notes (Signed)
Elevation in creatinine noted.  Please have patient discontinue methotrexate.  She had no synovitis during the last visit.

## 2017-11-11 DIAGNOSIS — Z6828 Body mass index (BMI) 28.0-28.9, adult: Secondary | ICD-10-CM | POA: Diagnosis not present

## 2017-11-11 DIAGNOSIS — T63441A Toxic effect of venom of bees, accidental (unintentional), initial encounter: Secondary | ICD-10-CM | POA: Diagnosis not present

## 2017-11-16 DIAGNOSIS — N183 Chronic kidney disease, stage 3 (moderate): Secondary | ICD-10-CM | POA: Diagnosis not present

## 2017-11-16 DIAGNOSIS — I129 Hypertensive chronic kidney disease with stage 1 through stage 4 chronic kidney disease, or unspecified chronic kidney disease: Secondary | ICD-10-CM | POA: Diagnosis not present

## 2017-11-16 DIAGNOSIS — E785 Hyperlipidemia, unspecified: Secondary | ICD-10-CM | POA: Diagnosis not present

## 2017-11-16 DIAGNOSIS — Z789 Other specified health status: Secondary | ICD-10-CM | POA: Diagnosis not present

## 2017-12-05 NOTE — Progress Notes (Signed)
Office Visit Note  Patient: Andrea [name]             Date of Birth: 1941/03/02           MRN: [id number]             PCP: Marco Collie, MD Referring: Marco Collie, MD Visit Date: 12/19/2017 Occupation: @GUAROCC @    Subjective:  Right shoulder pain    History of Present Illness: Andrea [name] is a 77 y.o. female with history of seropositive rheumatoid arthritis and osteoporosis.  She discontinued use of her methotrexate due to elevated creatinine and low GFR.  She has been injecting Enbrel weekly.  She denies any recent flares of her rheumatoid arthritis.  She states that she has having discomfort in her right shoulder.  She states she has good range of motion but discomfort with motion.  She denies any injuries.  She denies any pain in her neck.  She says she continues to have muscle tenderness in the trapezius region bilaterally.  She states that she has some radiation pain down her right arm.  She denies any pain in her hands or feet at this time.  She denies any joint swelling.  She states that she is having some decreased grip strength in bilateral hands.  She states that she has not noticed any difference since discontinuing her methotrexate. She states that overall her bilateral knee replacements are doing well.  She says occasionally she will have pain in her right knee.  She denies any swelling. She continues to inject Forteo daily.  She denies any reactions.  She states that she is tolerating well.  She denies any side effects.  She states she continues to take calcium and vitamin D daily.    Activities of Daily Living:  Patient reports morning stiffness for 10-15 minutes.   Patient Reports nocturnal pain.  Difficulty dressing/grooming: Denies Difficulty climbing stairs: Reports Difficulty getting out of chair: Reports Difficulty using hands for taps, buttons, cutlery, and/or writing: Reports   Review of Systems  Constitutional: Negative for fatigue.  HENT: Negative for  mouth sores, mouth dryness and nose dryness.   Eyes: Negative for pain, visual disturbance and dryness.  Respiratory: Negative for cough, hemoptysis, shortness of breath and difficulty breathing.   Cardiovascular: Negative for chest pain, palpitations, hypertension and swelling in legs/feet.  Gastrointestinal: Negative for blood in stool, constipation and diarrhea.  Endocrine: Negative for increased urination.  Genitourinary: Negative for painful urination.  Musculoskeletal: Positive for arthralgias, joint pain and morning stiffness. Negative for joint swelling, myalgias, muscle weakness, muscle tenderness and myalgias.  Skin: Negative for color change, pallor, rash, hair loss, nodules/bumps, skin tightness, ulcers and sensitivity to sunlight.  Allergic/Immunologic: Negative for susceptible to infections.  Neurological: Negative for dizziness, numbness, headaches and weakness.  Hematological: Negative for swollen glands.  Psychiatric/Behavioral: Negative for depressed mood and sleep disturbance. The patient is not nervous/anxious.     PMFS History:  Patient Active Problem List   Diagnosis Date Noted  . History of hypertension 02/08/2017  . History of gastroesophageal reflux (GERD) 02/08/2017  . High risk medication use 12/27/2016  . Vitamin D deficiency 12/27/2016  . History of total knee replacement, bilateral 08/27/2016  . Age-related osteoporosis without current pathological fracture 08/27/2016  . Seropositive rheumatoid arthritis of multiple sites ([place]) 06/21/2016    Past Medical History:  Diagnosis Date  . Arthritis   . Hypertension     Family History  Problem Relation Age of Onset  .  Kidney disease Mother   . Cancer Father   . Stroke Brother   . Lung disease Brother   . Rheum arthritis Daughter   . Rheum arthritis Daughter    Past Surgical History:  Procedure Laterality Date  . JOINT REPLACEMENT     BIL knee   . KNEE ARTHROPLASTY     Social History   Social  History Narrative  . Not on file     Objective: Vital Signs: BP 125/73   Pulse 61   Resp 14   Ht 5\' 4"  (1.626 m)   Wt 167 lb 8 oz (76 kg)   BMI 28.75 kg/m    Physical Exam  Constitutional: She is oriented to person, place, and time. She appears well-developed and well-nourished.  HENT:  Head: Normocephalic and atraumatic.  Eyes: Conjunctivae and EOM are normal.  Neck: Normal range of motion.  Cardiovascular: Normal rate, regular rhythm, normal heart sounds and intact distal pulses.  Pulmonary/Chest: Effort normal and breath sounds normal.  Abdominal: Soft. Bowel sounds are normal.  Lymphadenopathy:    She has no cervical adenopathy.  Neurological: She is alert and oriented to person, place, and time.  Skin: Skin is warm and dry. Capillary refill takes less than 2 seconds.  Psychiatric: She has a normal mood and affect. Her behavior is normal.  Nursing note and vitals reviewed.    Musculoskeletal Exam: C-spine, thoracic spine, lumbar spine good range of motion.  Tenderness of bilateral trapezius muscles.  Full range of motion of right shoulder with discomfort.  She has tenderness anteriorly of her right shoulder.  Left shoulder good range of motion.  Elbow joints, wrist joints, MCPs, PIPs, DIPs good range of motion.  Ulnar deviation of all MCP joints.  She has synovial thickening of bilateral wrist joints.  She also has MCP synovial thickening.  No active synovitis noted.  Hip joints good range of motion.  No tenderness of trochanteric bursa bilaterally.  Knee joint slightly limited extension.  No warmth or effusion noted.  Ankle joints, MTPs, PIPs, DIPs good range of motion.  No active ankle synovitis.  She has pedal edema bilaterally.  CDAI Exam: CDAI Homunculus Exam:   Tenderness:  RUE: glenohumeral  Joint Counts:  CDAI Tender Joint count: 1 CDAI Swollen Joint count: 0  Global Assessments:  Patient Global Assessment: 6 Provider Global Assessment: 6  CDAI Calculated  Score: 13    Investigation: No additional findings.TB Gold: 03/25/2017 Negative  CBC Latest Ref Rng & Units 11/02/2017 06/28/2017 03/25/2017  WBC 3.8 - 10.8 Thousand/uL 5.7 5.5 3.8  Hemoglobin 11.7 - 15.5 g/dL 12.2 11.9 12.4  Hematocrit 35.0 - 45.0 % 35.5 34.8(L) 36.5  Platelets 140 - 400 Thousand/uL 257 211 215   CMP Latest Ref Rng & Units 11/02/2017 06/28/2017 03/25/2017  Glucose 65 - 99 mg/dL 76 73 88  BUN 7 - 25 mg/dL 23 22 20   Creatinine 0.60 - 0.93 mg/dL 1.42(H) 1.09(H) 1.10(H)  Sodium 135 - 146 mmol/L 139 139 138  Potassium 3.5 - 5.3 mmol/L 3.5 3.9 3.5  Chloride 98 - 110 mmol/L 100 100 99  CO2 20 - 32 mmol/L 30 31 28   Calcium 8.6 - 10.4 mg/dL 9.7 9.9 9.5  Total Protein 6.1 - 8.1 g/dL 7.1 7.3 7.1  Total Bilirubin 0.2 - 1.2 mg/dL 0.4 0.4 0.6  Alkaline Phos 33 - 130 U/L - - 48  AST 10 - 35 U/L 16 15 21   ALT 6 - 29 U/L 8 11 12  Imaging: No results found.  Speciality Comments: TB Gold: 03/25/17 Neg    Procedures:  Large Joint Inj: R glenohumeral on 12/19/2017 3:24 PM Indications: pain Details: 27 G 1.5 in needle, posterior approach  Arthrogram: No  Medications: 1.5 mL lidocaine 1 %; 40 mg triamcinolone acetonide 40 MG/ML Aspirate: 0 mL Outcome: tolerated well, no immediate complications Procedure, treatment alternatives, risks and benefits explained, specific risks discussed. Consent was given by the patient. Immediately prior to procedure a time out was called to verify the correct patient, procedure, equipment, support staff and site/side marked as required. Patient was prepped and draped in the usual sterile fashion.     Allergies: Patient has no known allergies.   Assessment / Plan:     Visit Diagnoses: Seropositive rheumatoid arthritis of multiple sites ([place]) - +RF,+anti CCP.:  She has no synovitis on exam.  She has no hand or feet pain.  She has no joint swelling at this time.  She will continue injecting Enbrel every week.  CMP was ordered today to assess her renal  function.  She has been off of methotrexate since 11/02/17 due to elevated creatinine and low GFR.  She has not had any recent flares of her rheumatoid arthritis.  High risk medication use - Enbrel 50 mg sq qweek.  CBC and CMP were drawn today.  She will be due for labs in July and every 3 months to monitor for drug toxicity.  We will continue to monitor her creatinine and GFR.  TB gold -03/25/2017.- Plan: CBC with Differential/Platelet, COMPLETE METABOLIC PANEL WITH GFR, COMPLETE METABOLIC PANEL WITH GFR, CBC with Differential/Platelet  History of total knee replacement, bilateral: Doing well.  She experiences discomfort in her right knee at times.  She is limited extension of bilateral knees.  She has no warmth or effusion on exam.  Age-related osteoporosis without current pathological fracture -She continues to inject Forteo daily.  She was started on Forteo on 03/25/2017.  She has not had any reactions and is tolerating it well.  She continues to take calcium and vitamin D on a daily basis.  01/12/2017 DEXA -3.7 left distal radius She was previously on Reclast and had 3 infusions.  Her last Reclast infusion was 06/2016.  History of vitamin D deficiency: She takes vitamin D on a daily basis.  Chronic right shoulder pain -she has been having increased pain in her right shoulder.  She has full range of motion with discomfort on exam.  She has been having radiation of pain down her right arm.  She has no neck pain or limited range of motion on exam.  She has some tenderness anteriorly.  She requested a cortisone injection today in the office.  She tolerated the procedure well.  She was advised to monitor her blood pressure closely following the cortisone injection today.  An x-ray of her shoulder was obtained.  X-ray revealed a type II acromium and high rising humerus.  No chondrocalcinosis was noted.  Plan: XR Shoulder Right  Other medical conditions are listed as follows:   History of  hypertension  History of gastroesophageal reflux (GERD)     Orders: Orders Placed This Encounter  Procedures  . Large Joint Inj  . XR Shoulder Right  . CBC with Differential/Platelet  . COMPLETE METABOLIC PANEL WITH GFR   No orders of the defined types were placed in this encounter.   Face-to-face time spent with patient was 30 minutes.> 50% of time was spent in counseling and coordination of  care.  Follow-Up Instructions: Return in about 5 months (around 05/21/2018) for Rheumatoid arthritis.   Ofilia Neas, PA-C  Note - This record has been created using Dragon software.  Chart creation errors have been sought, but may not always  have been located. Such creation errors do not reflect on  the standard of medical care.

## 2017-12-19 ENCOUNTER — Encounter: Payer: Self-pay | Admitting: Rheumatology

## 2017-12-19 ENCOUNTER — Ambulatory Visit (INDEPENDENT_AMBULATORY_CARE_PROVIDER_SITE_OTHER): Payer: Medicare Other

## 2017-12-19 ENCOUNTER — Ambulatory Visit (INDEPENDENT_AMBULATORY_CARE_PROVIDER_SITE_OTHER): Payer: Medicare Other | Admitting: Rheumatology

## 2017-12-19 VITALS — BP 125/73 | HR 61 | Resp 14 | Ht 64.0 in | Wt 167.5 lb

## 2017-12-19 DIAGNOSIS — M25511 Pain in right shoulder: Secondary | ICD-10-CM

## 2017-12-19 DIAGNOSIS — M810 Age-related osteoporosis without current pathological fracture: Secondary | ICD-10-CM

## 2017-12-19 DIAGNOSIS — Z8679 Personal history of other diseases of the circulatory system: Secondary | ICD-10-CM | POA: Diagnosis not present

## 2017-12-19 DIAGNOSIS — Z8719 Personal history of other diseases of the digestive system: Secondary | ICD-10-CM

## 2017-12-19 DIAGNOSIS — Z96653 Presence of artificial knee joint, bilateral: Secondary | ICD-10-CM | POA: Diagnosis not present

## 2017-12-19 DIAGNOSIS — G8929 Other chronic pain: Secondary | ICD-10-CM

## 2017-12-19 DIAGNOSIS — Z79899 Other long term (current) drug therapy: Secondary | ICD-10-CM | POA: Diagnosis not present

## 2017-12-19 DIAGNOSIS — Z8639 Personal history of other endocrine, nutritional and metabolic disease: Secondary | ICD-10-CM | POA: Diagnosis not present

## 2017-12-19 DIAGNOSIS — M0579 Rheumatoid arthritis with rheumatoid factor of multiple sites without organ or systems involvement: Secondary | ICD-10-CM

## 2017-12-19 LAB — COMPLETE METABOLIC PANEL WITH GFR
AG RATIO: 1.1 (calc) (ref 1.0–2.5)
ALT: 7 U/L (ref 6–29)
AST: 15 U/L (ref 10–35)
Albumin: 4 g/dL (ref 3.6–5.1)
Alkaline phosphatase (APISO): 60 U/L (ref 33–130)
BUN/Creatinine Ratio: 16 (calc) (ref 6–22)
BUN: 17 mg/dL (ref 7–25)
CO2: 32 mmol/L (ref 20–32)
Calcium: 9.9 mg/dL (ref 8.6–10.4)
Chloride: 100 mmol/L (ref 98–110)
Creat: 1.04 mg/dL — ABNORMAL HIGH (ref 0.60–0.93)
GFR, EST AFRICAN AMERICAN: 60 mL/min/{1.73_m2} (ref >=60)
GFR, EST NON AFRICAN AMERICAN: 51 mL/min/{1.73_m2} — AB (ref >=60)
GLUCOSE: 83 mg/dL (ref 65–99)
Globulin: 3.5 g/dL (calc) (ref 1.9–3.7)
Potassium: 3.7 mmol/L (ref 3.5–5.3)
Sodium: 139 mmol/L (ref 135–146)
TOTAL PROTEIN: 7.5 g/dL (ref 6.1–8.1)
Total Bilirubin: 0.5 mg/dL (ref 0.2–1.2)

## 2017-12-19 LAB — CBC WITH DIFFERENTIAL/PLATELET
BASOS ABS: 32 {cells}/uL (ref 0–200)
Basophils Relative: 0.7 %
EOS ABS: 534 {cells}/uL — AB (ref 15–500)
Eosinophils Relative: 11.6 %
HEMATOCRIT: 35.9 % (ref 35.0–45.0)
HEMOGLOBIN: 12.3 g/dL (ref 11.7–15.5)
LYMPHS ABS: 1587 {cells}/uL (ref 850–3900)
MCH: 32 pg (ref 27.0–33.0)
MCHC: 34.3 g/dL (ref 32.0–36.0)
MCV: 93.5 fL (ref 80.0–100.0)
MPV: 9.5 fL (ref 7.5–12.5)
Monocytes Relative: 10.1 %
NEUTROS ABS: 1983 {cells}/uL (ref 1500–7800)
Neutrophils Relative %: 43.1 %
Platelets: 182 10*3/uL (ref 140–400)
RBC: 3.84 10*6/uL (ref 3.80–5.10)
RDW: 12.2 % (ref 11.0–15.0)
Total Lymphocyte: 34.5 %
WBC mixed population: 465 cells/uL (ref 200–950)
WBC: 4.6 10*3/uL (ref 3.8–10.8)

## 2017-12-19 MED ORDER — TRIAMCINOLONE ACETONIDE 40 MG/ML IJ SUSP
40.0000 mg | INTRAMUSCULAR | Status: AC | PRN
  Administered 2017-12-19: 40 mg via INTRA_ARTICULAR

## 2017-12-19 MED ORDER — LIDOCAINE HCL 1 % IJ SOLN
1.5000 mL | INTRAMUSCULAR | Status: AC | PRN
  Administered 2017-12-19: 1.5 mL

## 2017-12-19 NOTE — Patient Instructions (Signed)
Standing Labs We placed an order today for your standing lab work.    Please come back and get your standing labs in July and every 3 months   We have open lab Monday through Friday from 8:30-11:30 AM and 1:30-4:00 PM  at the office of Dr. Shaili Deveshwar.   You may experience shorter wait times on Monday and Friday afternoons. The office is located at [address], [address], [place], [place] [address] No appointment is necessary.   Labs are drawn by [facility].  You may receive a bill from [facility] for your lab work. If you have any questions regarding directions or hours of operation,  please call [phone].    

## 2017-12-20 ENCOUNTER — Other Ambulatory Visit: Payer: Self-pay | Admitting: Rheumatology

## 2017-12-20 NOTE — Progress Notes (Signed)
Creatinine and GFR stable.  All other lab values WNL.

## 2017-12-20 NOTE — Telephone Encounter (Signed)
Last Visit: 12/19/17 Next Visit: 05/22/18 Labs: 12/19/17 Creatinine and GFR stable. All other lab values WNL.  Okay to refill per Dr. Deveshwar 

## 2018-01-04 DIAGNOSIS — Z1231 Encounter for screening mammogram for malignant neoplasm of breast: Secondary | ICD-10-CM | POA: Diagnosis not present

## 2018-01-13 ENCOUNTER — Telehealth: Payer: Self-pay | Admitting: Rheumatology

## 2018-01-13 NOTE — Telephone Encounter (Signed)
Patient left a voicemail requesting prescription refill of Enbrel.   °

## 2018-01-13 NOTE — Telephone Encounter (Signed)
Last Visit: 12/19/17 Next Visit: 05/22/18 Labs: 12/19/17 Creatinine and GFR stable. All other lab values WNL. TB Gold: 03/25/18 Neg   Okay to refill per Dr. Estanislado Pandy Prescription faxed to [facility]

## 2018-01-19 DIAGNOSIS — R921 Mammographic calcification found on diagnostic imaging of breast: Secondary | ICD-10-CM | POA: Diagnosis not present

## 2018-02-08 DIAGNOSIS — L080 Pyoderma: Secondary | ICD-10-CM | POA: Diagnosis not present

## 2018-02-08 DIAGNOSIS — W57XXXA Bitten or stung by nonvenomous insect and other nonvenomous arthropods, initial encounter: Secondary | ICD-10-CM | POA: Diagnosis not present

## 2018-02-08 DIAGNOSIS — Z6828 Body mass index (BMI) 28.0-28.9, adult: Secondary | ICD-10-CM | POA: Diagnosis not present

## 2018-02-09 ENCOUNTER — Other Ambulatory Visit: Payer: Self-pay | Admitting: Rheumatology

## 2018-02-09 NOTE — Telephone Encounter (Signed)
Patient called requesting an RX refill on her Enbrel.  She uses [facility].  CB#[phone].  Thank you.

## 2018-02-10 NOTE — Telephone Encounter (Signed)
Patient receives her Enbrel through the [employer] program. Prescription was faxed on 01/13/18. Patient was provided with phone number to contact [employer] and have the medicine shipped to her. If she has any trouble she will contact the office.

## 2018-02-14 DIAGNOSIS — I129 Hypertensive chronic kidney disease with stage 1 through stage 4 chronic kidney disease, or unspecified chronic kidney disease: Secondary | ICD-10-CM | POA: Diagnosis not present

## 2018-02-14 DIAGNOSIS — E785 Hyperlipidemia, unspecified: Secondary | ICD-10-CM | POA: Diagnosis not present

## 2018-02-14 DIAGNOSIS — R7301 Impaired fasting glucose: Secondary | ICD-10-CM | POA: Diagnosis not present

## 2018-02-19 DIAGNOSIS — N183 Chronic kidney disease, stage 3 (moderate): Secondary | ICD-10-CM | POA: Diagnosis not present

## 2018-02-19 DIAGNOSIS — I129 Hypertensive chronic kidney disease with stage 1 through stage 4 chronic kidney disease, or unspecified chronic kidney disease: Secondary | ICD-10-CM | POA: Diagnosis not present

## 2018-02-19 DIAGNOSIS — E785 Hyperlipidemia, unspecified: Secondary | ICD-10-CM | POA: Diagnosis not present

## 2018-03-02 DIAGNOSIS — R7303 Prediabetes: Secondary | ICD-10-CM | POA: Diagnosis not present

## 2018-03-02 DIAGNOSIS — E785 Hyperlipidemia, unspecified: Secondary | ICD-10-CM | POA: Diagnosis not present

## 2018-03-02 DIAGNOSIS — I129 Hypertensive chronic kidney disease with stage 1 through stage 4 chronic kidney disease, or unspecified chronic kidney disease: Secondary | ICD-10-CM | POA: Diagnosis not present

## 2018-03-02 DIAGNOSIS — N183 Chronic kidney disease, stage 3 (moderate): Secondary | ICD-10-CM | POA: Diagnosis not present

## 2018-03-22 DIAGNOSIS — Z6828 Body mass index (BMI) 28.0-28.9, adult: Secondary | ICD-10-CM | POA: Diagnosis not present

## 2018-03-22 DIAGNOSIS — E785 Hyperlipidemia, unspecified: Secondary | ICD-10-CM | POA: Diagnosis not present

## 2018-03-22 DIAGNOSIS — N183 Chronic kidney disease, stage 3 (moderate): Secondary | ICD-10-CM | POA: Diagnosis not present

## 2018-03-22 DIAGNOSIS — I129 Hypertensive chronic kidney disease with stage 1 through stage 4 chronic kidney disease, or unspecified chronic kidney disease: Secondary | ICD-10-CM | POA: Diagnosis not present

## 2018-04-20 ENCOUNTER — Telehealth: Payer: Self-pay | Admitting: Rheumatology

## 2018-04-20 MED ORDER — TERIPARATIDE (RECOMBINANT) 600 MCG/2.4ML ~~LOC~~ SOLN: SUBCUTANEOUS | 1 refills | Status: DC

## 2018-04-20 NOTE — Telephone Encounter (Signed)
Last Visit: 12/19/17 Next Visit: 05/22/18 Labs: 12/19/17 Creatinine and GFR stable. All other lab values WNL.  Okay to refill per Dr. Estanislado Pandy

## 2018-04-20 NOTE — Telephone Encounter (Signed)
Patient left a voicemail requesting a prescription refill of Forteo.

## 2018-04-21 DIAGNOSIS — Z6828 Body mass index (BMI) 28.0-28.9, adult: Secondary | ICD-10-CM | POA: Diagnosis not present

## 2018-04-21 DIAGNOSIS — I129 Hypertensive chronic kidney disease with stage 1 through stage 4 chronic kidney disease, or unspecified chronic kidney disease: Secondary | ICD-10-CM | POA: Diagnosis not present

## 2018-04-21 DIAGNOSIS — E785 Hyperlipidemia, unspecified: Secondary | ICD-10-CM | POA: Diagnosis not present

## 2018-04-21 DIAGNOSIS — N183 Chronic kidney disease, stage 3 (moderate): Secondary | ICD-10-CM | POA: Diagnosis not present

## 2018-05-09 NOTE — Progress Notes (Signed)
Office Visit Note  Patient: Andrea [name]             Date of Birth: 19-Dec-1940           MRN: [id number]             PCP: Marco Collie, MD Referring: Marco Collie, MD Visit Date: 05/22/2018 Occupation: @GUAROCC @  Subjective:  Right knee pain   History of Present Illness: Andrea [name] is a 77 y.o. female with history of seropositive rheumatoid arthritis and osteoporosis.  Patient is on Enbrel 50 mg subcutaneous injections once a week.  She is on Forteo sq injections once daily for treatment of osteoporosis.  She has been having increased pain in her right knee joint which is replaced.  She denies any joint swelling.  She denies any recent flares of rheumatoid arthritis.  She continues to have chronic pain in bilateral feet and her right ankle.  She has morning stiffness lasting 2 to 5 minutes in the morning.  She feels that Enbrel continues to be effective.  She wants a refill of Enbrel today.    Activities of Daily Living:  Patient reports morning stiffness for2-5  minutes.   Patient Denies nocturnal pain.  Difficulty dressing/grooming: Denies Difficulty climbing stairs: Reports Difficulty getting out of chair: Reports Difficulty using hands for taps, buttons, cutlery, and/or writing: Denies  Review of Systems  Constitutional: Negative for fatigue.  HENT: Negative for mouth sores, mouth dryness and nose dryness.   Eyes: Negative for pain, visual disturbance and dryness.  Respiratory: Negative for cough, hemoptysis, shortness of breath and difficulty breathing.   Cardiovascular: Negative for chest pain, palpitations, hypertension and swelling in legs/feet.  Gastrointestinal: Positive for constipation. Negative for blood in stool and diarrhea.  Endocrine: Negative for increased urination.  Genitourinary: Negative for painful urination.  Musculoskeletal: Positive for arthralgias, joint pain and morning stiffness. Negative for joint swelling, myalgias, muscle weakness, muscle  tenderness and myalgias.  Skin: Negative for color change, pallor, rash, hair loss, nodules/bumps, skin tightness, ulcers and sensitivity to sunlight.  Allergic/Immunologic: Negative for susceptible to infections.  Neurological: Negative for dizziness, numbness, headaches and weakness.  Hematological: Negative for swollen glands.  Psychiatric/Behavioral: Positive for sleep disturbance. Negative for depressed mood. The patient is not nervous/anxious.     PMFS History:  Patient Active Problem List   Diagnosis Date Noted  . History of hypertension 02/08/2017  . History of gastroesophageal reflux (GERD) 02/08/2017  . High risk medication use 12/27/2016  . Vitamin D deficiency 12/27/2016  . History of total knee replacement, bilateral 08/27/2016  . Age-related osteoporosis without current pathological fracture 08/27/2016  . Seropositive rheumatoid arthritis of multiple sites ([place]) 06/21/2016    Past Medical History:  Diagnosis Date  . Arthritis   . Hypertension     Family History  Problem Relation Age of Onset  . Kidney disease Mother   . Cancer Father   . Stroke Brother   . Lung disease Brother   . Rheum arthritis Daughter   . Rheum arthritis Daughter    Past Surgical History:  Procedure Laterality Date  . JOINT REPLACEMENT     BIL knee   . KNEE ARTHROPLASTY     Social History   Social History Narrative  . Not on file    Objective: Vital Signs: BP (!) 154/81 (BP Location: Left Arm, Patient Position: Sitting, Cuff Size: Normal)   Pulse 68   Resp 15   Ht 5' 4.5" (1.638 m)   Wt  164 lb 8 oz (74.6 kg)   BMI 27.80 kg/m    Physical Exam  Constitutional: She is oriented to person, place, and time. She appears well-developed and well-nourished.  HENT:  Head: Normocephalic and atraumatic.  Eyes: Conjunctivae and EOM are normal.  Neck: Normal range of motion.  Cardiovascular: Normal rate, regular rhythm, normal heart sounds and intact distal pulses.  Pulmonary/Chest:  Effort normal and breath sounds normal.  Abdominal: Soft. Bowel sounds are normal.  Lymphadenopathy:    She has no cervical adenopathy.  Neurological: She is alert and oriented to person, place, and time.  Skin: Skin is warm and dry. Capillary refill takes less than 2 seconds.  Psychiatric: She has a normal mood and affect. Her behavior is normal.  Nursing note and vitals reviewed.    Musculoskeletal Exam: C-spine, thoracic spine, lumbar spine good range of motion.  No midline spinal tenderness.  Full range of motion bilateral shoulder joints.  Elbow joints, wrist joints, MCPs, PIPs, DIPs good range of motion with no synovitis.  She has ulnar deviation of all MCP joints.  She synovial thickening of bilateral wrist joints.  She has synovial thickening of MCP joints.  Hip joints good range of motion with no discomfort.  Bilateral knee replacements have slightly limited extension.  No warmth or effusion noted.  Ankle joints, MTPs, PIPs, DIPs good range of motion.  Right ankle tenderness.  Pedal edema noted bilaterally.  CDAI Exam: CDAI Score: 1  Patient Global Assessment: 5 (mm); Provider Global Assessment: 5 (mm) Swollen: 0 ; Tender: 0  Joint Exam   Not documented   There is currently no information documented on the homunculus. Go to the Rheumatology activity and complete the homunculus joint exam.  Investigation: No additional findings.  Imaging: No results found.  Recent Labs: Lab Results  Component Value Date   WBC 4.6 12/19/2017   HGB 12.3 12/19/2017   PLT 182 12/19/2017   NA 139 12/19/2017   K 3.7 12/19/2017   CL 100 12/19/2017   CO2 32 12/19/2017   GLUCOSE 83 12/19/2017   BUN 17 12/19/2017   CREATININE 1.04 (H) 12/19/2017   BILITOT 0.5 12/19/2017   ALKPHOS 48 03/25/2017   AST 15 12/19/2017   ALT 7 12/19/2017   PROT 7.5 12/19/2017   ALBUMIN 3.8 03/25/2017   CALCIUM 9.9 12/19/2017   GFRAA 60 12/19/2017    Speciality Comments: TB Gold: 03/25/17 Neg  Procedures:    No procedures performed Allergies: Patient has no known allergies.   Assessment / Plan:     Visit Diagnoses: Seropositive rheumatoid arthritis of multiple sites ([place]) - +RF,+anti CCP: She has no synovitis on exam.  She has not had any recent rheumatoid arthritis flares.  She has synovial thickening of bilateral wrist joints and all MCP joints.  She has ulnar deviation bilaterally.  She is clinically doing well while on Enbrel 50 mg subcutaneous injections once weekly.  A refill of Enbrel will be sent to the pharmacy today.  We will also obtain x-rays of bilateral hands and feet to assess for radiographic progression.  She was advised to notify us if she develops increased joint pain or joint swelling.  She will follow-up in the office in 5 months.  She was reminded of the importance of yearly skin exams.  She was also encouraged to get her yearly influenza vaccine.- Plan: XR Hand 2 View Right, XR Hand 2 View Left, XR Foot 2 Views Right, XR Foot 2 Views Left  High risk  medication use - Enbrel 50 mg sq qweek-CBC and CMP will be checked today to monitor for drug toxicity.  She will return in December and every 3 months for lab work.  TB gold will also be checked today.  She was encouraged to follow-up with dermatology for yearly skin exams.  Plan: COMPLETE METABOLIC PANEL WITH GFR, CBC with Differential/Platelet, QuantiFERON-TB Gold Plus  History of total knee replacement, bilateral: Slightly limited extension.  No warmth or effusion noted.  She has discomfort in her right knee joint.  She can use Voltaren gel topically.  Age-related osteoporosis without current pathological fracture -She is on Forteo subcu injections daily.. 01/12/2017 DEXA -3.7 left distal radius. She was previously on Reclast and had 3 infusions.  She is on vitamin D 2000 units by mouth daily.  History of vitamin D deficiency: She is on vitamin D 2000 units by mouth daily.  Other medical conditions are listed as follows:  History of  gastroesophageal reflux (GERD)  History of hypertension   Orders: Orders Placed This Encounter  Procedures  . XR Hand 2 View Right  . XR Hand 2 View Left  . XR Foot 2 Views Right  . XR Foot 2 Views Left  . COMPLETE METABOLIC PANEL WITH GFR  . CBC with Differential/Platelet  . QuantiFERON-TB Gold Plus   Meds ordered this encounter  Medications  . etanercept (ENBREL SURECLICK) 50 MG/ML injection    Sig: Inject 0.98 mLs (50 mg total) into the skin once a week.    Dispense:  11.76 mL    Refill:  0    Face-to-face time spent with patient was 30 minutes. Greater than 50% of time was spent in counseling and coordination of care.  Follow-Up Instructions: Return in about 5 months (around 10/21/2018) for Rheumatoid arthritis, Osteoporosis.   Ofilia Neas, PA-C   I examined and evaluated the patient with Hazel Sams PA.  Patient is clinically doing well without any synovitis on examination.  I obtain x-rays of bilateral hands and bilateral feet for comparison.  There was no radiographic progression from the x-rays of 2010.  She will continue current regimen.  She is also taking Forteo for osteoporosis which she is tolerating well.  The plan of care was discussed as noted above.  Bo Merino, MD Note - This record has been created using Editor, commissioning.  Chart creation errors have been sought, but may not always  have been located. Such creation errors do not reflect on  the standard of medical care.

## 2018-05-22 ENCOUNTER — Telehealth: Payer: Self-pay | Admitting: Pharmacy Technician

## 2018-05-22 ENCOUNTER — Ambulatory Visit (INDEPENDENT_AMBULATORY_CARE_PROVIDER_SITE_OTHER): Payer: Medicare Other

## 2018-05-22 ENCOUNTER — Ambulatory Visit (INDEPENDENT_AMBULATORY_CARE_PROVIDER_SITE_OTHER): Payer: Medicare Other | Admitting: Rheumatology

## 2018-05-22 ENCOUNTER — Encounter: Payer: Self-pay | Admitting: Rheumatology

## 2018-05-22 ENCOUNTER — Ambulatory Visit (INDEPENDENT_AMBULATORY_CARE_PROVIDER_SITE_OTHER): Payer: Self-pay

## 2018-05-22 VITALS — BP 154/81 | HR 68 | Resp 15 | Ht 64.5 in | Wt 164.5 lb

## 2018-05-22 DIAGNOSIS — M810 Age-related osteoporosis without current pathological fracture: Secondary | ICD-10-CM | POA: Diagnosis not present

## 2018-05-22 DIAGNOSIS — M0579 Rheumatoid arthritis with rheumatoid factor of multiple sites without organ or systems involvement: Secondary | ICD-10-CM

## 2018-05-22 DIAGNOSIS — Z79899 Other long term (current) drug therapy: Secondary | ICD-10-CM | POA: Diagnosis not present

## 2018-05-22 DIAGNOSIS — Z8719 Personal history of other diseases of the digestive system: Secondary | ICD-10-CM

## 2018-05-22 DIAGNOSIS — Z8639 Personal history of other endocrine, nutritional and metabolic disease: Secondary | ICD-10-CM

## 2018-05-22 DIAGNOSIS — E785 Hyperlipidemia, unspecified: Secondary | ICD-10-CM | POA: Diagnosis not present

## 2018-05-22 DIAGNOSIS — I129 Hypertensive chronic kidney disease with stage 1 through stage 4 chronic kidney disease, or unspecified chronic kidney disease: Secondary | ICD-10-CM | POA: Diagnosis not present

## 2018-05-22 DIAGNOSIS — Z8679 Personal history of other diseases of the circulatory system: Secondary | ICD-10-CM

## 2018-05-22 DIAGNOSIS — Z96653 Presence of artificial knee joint, bilateral: Secondary | ICD-10-CM

## 2018-05-22 DIAGNOSIS — N183 Chronic kidney disease, stage 3 (moderate): Secondary | ICD-10-CM | POA: Diagnosis not present

## 2018-05-22 DIAGNOSIS — R7303 Prediabetes: Secondary | ICD-10-CM | POA: Diagnosis not present

## 2018-05-22 MED ORDER — ETANERCEPT 50 MG/ML ~~LOC~~ SOAJ: 50.0000 mg | SUBCUTANEOUS | 0 refills | Status: DC

## 2018-05-22 NOTE — Patient Instructions (Signed)
Standing Labs We placed an order today for your standing lab work.    Please come back and get your standing labs in December and every 3 months   We have open lab Monday through Friday from 8:30-11:30 AM and 1:30-4:00 PM  at the office of Dr. Ondra Deboard.   You may experience shorter wait times on Monday and Friday afternoons. The office is located at [address], [address], [place], [place] [address] No appointment is necessary.   Labs are drawn by [facility].  You may receive a bill from [facility] for your lab work. If you have any questions regarding directions or hours of operation,  please call [phone].   Just as a reminder please drink plenty of water prior to coming for your lab work. Thanks!   

## 2018-05-22 NOTE — Telephone Encounter (Signed)
Faxed prescription to [facility].  12:13 PM Beatriz Chancellor, CPhT

## 2018-05-23 NOTE — Progress Notes (Signed)
Creatinine  and GFR stable. Potassium is low.  Please forward to PCP.  CBC reveals findings of borderline anemia.  Please recommend a multivitamin with iron and send to PCP.

## 2018-05-24 LAB — COMPLETE METABOLIC PANEL WITH GFR
AG Ratio: 1 (calc) (ref 1.0–2.5)
ALBUMIN MSPROF: 3.5 g/dL — AB (ref 3.6–5.1)
ALT: 7 U/L (ref 6–29)
AST: 14 U/L (ref 10–35)
Alkaline phosphatase (APISO): 57 U/L (ref 33–130)
BUN/Creatinine Ratio: 18 (calc) (ref 6–22)
BUN: 19 mg/dL (ref 7–25)
CALCIUM: 9.1 mg/dL (ref 8.6–10.4)
CO2: 32 mmol/L (ref 20–32)
CREATININE: 1.05 mg/dL — AB (ref 0.60–0.93)
Chloride: 100 mmol/L (ref 98–110)
GFR, EST AFRICAN AMERICAN: 59 mL/min/{1.73_m2} — AB (ref >=60)
GFR, EST NON AFRICAN AMERICAN: 51 mL/min/{1.73_m2} — AB (ref >=60)
GLOBULIN: 3.4 g/dL (ref 1.9–3.7)
Glucose, Bld: 83 mg/dL (ref 65–99)
Potassium: 3.3 mmol/L — ABNORMAL LOW (ref 3.5–5.3)
Sodium: 139 mmol/L (ref 135–146)
TOTAL PROTEIN: 6.9 g/dL (ref 6.1–8.1)
Total Bilirubin: 0.5 mg/dL (ref 0.2–1.2)

## 2018-05-24 LAB — CBC WITH DIFFERENTIAL/PLATELET
BASOS ABS: 32 {cells}/uL (ref 0–200)
Basophils Relative: 0.7 %
EOS ABS: 363 {cells}/uL (ref 15–500)
Eosinophils Relative: 7.9 %
HCT: 33.5 % — ABNORMAL LOW (ref 35.0–45.0)
Hemoglobin: 11.6 g/dL — ABNORMAL LOW (ref 11.7–15.5)
Lymphs Abs: 1757 cells/uL (ref 850–3900)
MCH: 31.6 pg (ref 27.0–33.0)
MCHC: 34.6 g/dL (ref 32.0–36.0)
MCV: 91.3 fL (ref 80.0–100.0)
MONOS PCT: 9.2 %
MPV: 9.5 fL (ref 7.5–12.5)
NEUTROS PCT: 44 %
Neutro Abs: 2024 cells/uL (ref 1500–7800)
PLATELETS: 183 10*3/uL (ref 140–400)
RBC: 3.67 10*6/uL — ABNORMAL LOW (ref 3.80–5.10)
RDW: 12 % (ref 11.0–15.0)
TOTAL LYMPHOCYTE: 38.2 %
WBC: 4.6 10*3/uL (ref 3.8–10.8)
WBCMIX: 423 {cells}/uL (ref 200–950)

## 2018-05-24 LAB — QUANTIFERON-TB GOLD PLUS
NIL: 0.04 [IU]/mL
QUANTIFERON-TB GOLD PLUS: NEGATIVE
TB1-NIL: 0.02 [IU]/mL
TB2-NIL: 0.01 [IU]/mL

## 2018-05-24 NOTE — Progress Notes (Signed)
TB gold negative

## 2018-06-09 DIAGNOSIS — Z23 Encounter for immunization: Secondary | ICD-10-CM | POA: Diagnosis not present

## 2018-06-21 ENCOUNTER — Telehealth: Payer: Self-pay | Admitting: Pharmacy Technician

## 2018-06-21 NOTE — Telephone Encounter (Signed)
Called patient to follow up on Forteo patient assistance application. Received fax that she was mailed a new application. Line busy, will follow up.  11:36 AM Beatriz Chancellor, CPhT

## 2018-06-22 ENCOUNTER — Telehealth: Payer: Self-pay | Admitting: Pharmacy Technician

## 2018-06-22 DIAGNOSIS — N183 Chronic kidney disease, stage 3 (moderate): Secondary | ICD-10-CM | POA: Diagnosis not present

## 2018-06-22 DIAGNOSIS — I129 Hypertensive chronic kidney disease with stage 1 through stage 4 chronic kidney disease, or unspecified chronic kidney disease: Secondary | ICD-10-CM | POA: Diagnosis not present

## 2018-06-22 DIAGNOSIS — R7303 Prediabetes: Secondary | ICD-10-CM | POA: Diagnosis not present

## 2018-06-22 DIAGNOSIS — E785 Hyperlipidemia, unspecified: Secondary | ICD-10-CM | POA: Diagnosis not present

## 2018-06-22 NOTE — Telephone Encounter (Signed)
Received a Prior Authorization request from [employer] for Enbrel. Authorization has been submitted to patient's insurance via Cover My Meds. Will update once we receive a response.  9:09 AM Beatriz Chancellor, CPhT

## 2018-06-23 NOTE — Telephone Encounter (Signed)
Received notification from [facility] regarding a prior authorization for Enbrel [employer]. Authorization has been APPROVED from 06/22/18 to 08/23/19.   Will send document to scan center, once received.  Authorization # [id number] Phone # [phone]  10:23 AM Beatriz Chancellor, CPhT

## 2018-06-29 NOTE — Telephone Encounter (Signed)
Spoke to patient, and she will come by with proof of income to complete 2 patient assistance applications.

## 2018-06-30 NOTE — Telephone Encounter (Signed)
Faxed Renewal Application to [employer]. Awaiting response.  Will send document to scan center.  Phone# [phone] Fax# [phone]  2:05 PM Beatriz Chancellor, CPhT

## 2018-06-30 NOTE — Telephone Encounter (Signed)
Faxed in renewal application. Awaiting response.

## 2018-06-30 NOTE — Telephone Encounter (Signed)
Patient came by to sign applications and bring income documents. Will fax once MD signature is received.

## 2018-07-06 DIAGNOSIS — E785 Hyperlipidemia, unspecified: Secondary | ICD-10-CM | POA: Diagnosis not present

## 2018-07-06 DIAGNOSIS — R7303 Prediabetes: Secondary | ICD-10-CM | POA: Diagnosis not present

## 2018-07-06 DIAGNOSIS — I129 Hypertensive chronic kidney disease with stage 1 through stage 4 chronic kidney disease, or unspecified chronic kidney disease: Secondary | ICD-10-CM | POA: Diagnosis not present

## 2018-07-06 NOTE — Telephone Encounter (Signed)
Received fax from [employer], renewal application has been APPROVED. Coverage is from 08/23/18 to 08/23/19.  Will send document to [facility] Center.  Phone# [phone] Fax# [phone]  8:16 AM Beatriz Chancellor, CPhT

## 2018-07-13 DIAGNOSIS — Z1331 Encounter for screening for depression: Secondary | ICD-10-CM | POA: Diagnosis not present

## 2018-07-13 DIAGNOSIS — Z139 Encounter for screening, unspecified: Secondary | ICD-10-CM | POA: Diagnosis not present

## 2018-07-13 DIAGNOSIS — R7303 Prediabetes: Secondary | ICD-10-CM | POA: Diagnosis not present

## 2018-07-13 DIAGNOSIS — I129 Hypertensive chronic kidney disease with stage 1 through stage 4 chronic kidney disease, or unspecified chronic kidney disease: Secondary | ICD-10-CM | POA: Diagnosis not present

## 2018-07-13 DIAGNOSIS — N183 Chronic kidney disease, stage 3 (moderate): Secondary | ICD-10-CM | POA: Diagnosis not present

## 2018-07-13 DIAGNOSIS — E785 Hyperlipidemia, unspecified: Secondary | ICD-10-CM | POA: Diagnosis not present

## 2018-07-25 DIAGNOSIS — R921 Mammographic calcification found on diagnostic imaging of breast: Secondary | ICD-10-CM | POA: Diagnosis not present

## 2018-08-22 DIAGNOSIS — E785 Hyperlipidemia, unspecified: Secondary | ICD-10-CM | POA: Diagnosis not present

## 2018-08-22 DIAGNOSIS — N183 Chronic kidney disease, stage 3 (moderate): Secondary | ICD-10-CM | POA: Diagnosis not present

## 2018-08-22 DIAGNOSIS — R7303 Prediabetes: Secondary | ICD-10-CM | POA: Diagnosis not present

## 2018-08-22 DIAGNOSIS — I129 Hypertensive chronic kidney disease with stage 1 through stage 4 chronic kidney disease, or unspecified chronic kidney disease: Secondary | ICD-10-CM | POA: Diagnosis not present

## 2018-09-14 NOTE — Telephone Encounter (Signed)
Received fax from [facility], renewal application has been APPROVED. Coverage is from 08/23/2018 to 08/23/2019.  Will send document to scan Center.  Phone# [phone] Fax# [phone]

## 2018-09-15 DIAGNOSIS — Z139 Encounter for screening, unspecified: Secondary | ICD-10-CM | POA: Diagnosis not present

## 2018-09-15 DIAGNOSIS — M069 Rheumatoid arthritis, unspecified: Secondary | ICD-10-CM | POA: Diagnosis not present

## 2018-09-15 DIAGNOSIS — R269 Unspecified abnormalities of gait and mobility: Secondary | ICD-10-CM | POA: Diagnosis not present

## 2018-09-15 DIAGNOSIS — R2689 Other abnormalities of gait and mobility: Secondary | ICD-10-CM | POA: Diagnosis not present

## 2018-09-15 DIAGNOSIS — R05 Cough: Secondary | ICD-10-CM | POA: Diagnosis not present

## 2018-09-19 DIAGNOSIS — R296 Repeated falls: Secondary | ICD-10-CM | POA: Diagnosis not present

## 2018-09-19 DIAGNOSIS — M810 Age-related osteoporosis without current pathological fracture: Secondary | ICD-10-CM | POA: Diagnosis not present

## 2018-09-19 DIAGNOSIS — R262 Difficulty in walking, not elsewhere classified: Secondary | ICD-10-CM | POA: Diagnosis not present

## 2018-09-19 DIAGNOSIS — M6281 Muscle weakness (generalized): Secondary | ICD-10-CM | POA: Diagnosis not present

## 2018-09-20 DIAGNOSIS — R296 Repeated falls: Secondary | ICD-10-CM | POA: Diagnosis not present

## 2018-09-20 DIAGNOSIS — M810 Age-related osteoporosis without current pathological fracture: Secondary | ICD-10-CM | POA: Diagnosis not present

## 2018-09-20 DIAGNOSIS — R262 Difficulty in walking, not elsewhere classified: Secondary | ICD-10-CM | POA: Diagnosis not present

## 2018-09-20 DIAGNOSIS — M6281 Muscle weakness (generalized): Secondary | ICD-10-CM | POA: Diagnosis not present

## 2018-09-21 DIAGNOSIS — R262 Difficulty in walking, not elsewhere classified: Secondary | ICD-10-CM | POA: Diagnosis not present

## 2018-09-21 DIAGNOSIS — M810 Age-related osteoporosis without current pathological fracture: Secondary | ICD-10-CM | POA: Diagnosis not present

## 2018-09-21 DIAGNOSIS — R296 Repeated falls: Secondary | ICD-10-CM | POA: Diagnosis not present

## 2018-09-21 DIAGNOSIS — M6281 Muscle weakness (generalized): Secondary | ICD-10-CM | POA: Diagnosis not present

## 2018-09-22 DIAGNOSIS — I129 Hypertensive chronic kidney disease with stage 1 through stage 4 chronic kidney disease, or unspecified chronic kidney disease: Secondary | ICD-10-CM | POA: Diagnosis not present

## 2018-09-22 DIAGNOSIS — E785 Hyperlipidemia, unspecified: Secondary | ICD-10-CM | POA: Diagnosis not present

## 2018-09-22 DIAGNOSIS — R7303 Prediabetes: Secondary | ICD-10-CM | POA: Diagnosis not present

## 2018-09-22 DIAGNOSIS — N183 Chronic kidney disease, stage 3 (moderate): Secondary | ICD-10-CM | POA: Diagnosis not present

## 2018-09-25 DIAGNOSIS — R296 Repeated falls: Secondary | ICD-10-CM | POA: Diagnosis not present

## 2018-09-25 DIAGNOSIS — M6281 Muscle weakness (generalized): Secondary | ICD-10-CM | POA: Diagnosis not present

## 2018-09-25 DIAGNOSIS — M810 Age-related osteoporosis without current pathological fracture: Secondary | ICD-10-CM | POA: Diagnosis not present

## 2018-09-25 DIAGNOSIS — R262 Difficulty in walking, not elsewhere classified: Secondary | ICD-10-CM | POA: Diagnosis not present

## 2018-09-27 ENCOUNTER — Other Ambulatory Visit: Payer: Self-pay | Admitting: Gastroenterology

## 2018-09-27 DIAGNOSIS — M6281 Muscle weakness (generalized): Secondary | ICD-10-CM | POA: Diagnosis not present

## 2018-09-27 DIAGNOSIS — R262 Difficulty in walking, not elsewhere classified: Secondary | ICD-10-CM | POA: Diagnosis not present

## 2018-09-27 DIAGNOSIS — M810 Age-related osteoporosis without current pathological fracture: Secondary | ICD-10-CM | POA: Diagnosis not present

## 2018-09-27 DIAGNOSIS — R296 Repeated falls: Secondary | ICD-10-CM | POA: Diagnosis not present

## 2018-09-27 NOTE — Telephone Encounter (Signed)
LMOM for patient to return my call in regard to RX refill. Need ov before getting refilled. Last seen in [place]

## 2018-09-29 DIAGNOSIS — R262 Difficulty in walking, not elsewhere classified: Secondary | ICD-10-CM | POA: Diagnosis not present

## 2018-09-29 DIAGNOSIS — M810 Age-related osteoporosis without current pathological fracture: Secondary | ICD-10-CM | POA: Diagnosis not present

## 2018-09-29 DIAGNOSIS — R296 Repeated falls: Secondary | ICD-10-CM | POA: Diagnosis not present

## 2018-09-29 DIAGNOSIS — M6281 Muscle weakness (generalized): Secondary | ICD-10-CM | POA: Diagnosis not present

## 2018-10-02 DIAGNOSIS — M810 Age-related osteoporosis without current pathological fracture: Secondary | ICD-10-CM | POA: Diagnosis not present

## 2018-10-02 DIAGNOSIS — M6281 Muscle weakness (generalized): Secondary | ICD-10-CM | POA: Diagnosis not present

## 2018-10-02 DIAGNOSIS — R262 Difficulty in walking, not elsewhere classified: Secondary | ICD-10-CM | POA: Diagnosis not present

## 2018-10-02 DIAGNOSIS — R296 Repeated falls: Secondary | ICD-10-CM | POA: Diagnosis not present

## 2018-10-02 NOTE — Progress Notes (Signed)
Office Visit Note  Patient: Andrea [name]             Date of Birth: Aug 16, 1941           MRN: [id number]             PCP: Marco Collie, MD Referring: Marco Collie, MD Visit Date: 10/16/2018 Occupation: @GUAROCC @  Subjective:  Bilateral shin pain   History of Present Illness: Andrea [name] is a 78 y.o. female with history of seropositive rheumatoid arthritis and osteoporosis.  Patient is on Enbrel 50 mg subcutaneous injections once weekly.  She denies any recent rheumatoid arthritis flares.  She has not missed any doses of Enbrel recently.  She states that she has had 2 recent falls, and she is going to PT for muscle strengthening.  She reports she is having pain in both knee joints that are replaced.  She is having radiating pain in both shins.  She denies any joint swelling or warmth.  She states she has been having bilateral shin pain for the past 2 months.  She denies any calf pain or tightness.  She denies any injuries or overuse activities.  She denies any other joint pain or joint swelling. She states she has had bronchitis intermittently since December 2019.  She had a CXR recently that revealed pneumonia and she was started on antibiotics by PCP.  She did not hold her dose of Enbrel while on antibiotics. She states she continues to have congestion and a mild cough but no fevers.   Activities of Daily Living:  Patient reports morning stiffness for 0 minutes.   Patient Denies nocturnal pain.  Difficulty dressing/grooming: Denies Difficulty climbing stairs: Reports Difficulty getting out of chair: Reports Difficulty using hands for taps, buttons, cutlery, and/or writing: Reports  Review of Systems  Constitutional: Negative for fatigue.  HENT: Positive for mouth dryness. Negative for mouth sores and nose dryness.   Eyes: Negative for pain, itching, visual disturbance and dryness.  Respiratory: Negative for cough, hemoptysis, shortness of breath, wheezing and difficulty  breathing.   Cardiovascular: Negative for chest pain, palpitations, hypertension and swelling in legs/feet.  Gastrointestinal: Negative for abdominal pain, blood in stool, constipation and diarrhea.  Endocrine: Negative for increased urination.  Genitourinary: Positive for nocturia. Negative for painful urination and pelvic pain.  Musculoskeletal: Positive for arthralgias, joint pain and muscle tenderness. Negative for joint swelling, myalgias, muscle weakness, morning stiffness and myalgias.  Skin: Negative for color change, pallor, rash, hair loss, nodules/bumps, redness, skin tightness, ulcers and sensitivity to sunlight.  Allergic/Immunologic: Negative for susceptible to infections.  Neurological: Negative for dizziness, light-headedness, numbness, headaches, memory loss and weakness.  Hematological: Negative for swollen glands.  Psychiatric/Behavioral: Negative for depressed mood, confusion and sleep disturbance. The patient is not nervous/anxious.     PMFS History:  Patient Active Problem List   Diagnosis Date Noted  . History of hypertension 02/08/2017  . History of gastroesophageal reflux (GERD) 02/08/2017  . High risk medication use 12/27/2016  . Vitamin D deficiency 12/27/2016  . History of total knee replacement, bilateral 08/27/2016  . Age-related osteoporosis without current pathological fracture 08/27/2016  . Seropositive rheumatoid arthritis of multiple sites ([place]) 06/21/2016    Past Medical History:  Diagnosis Date  . Arthritis   . Hypertension     Family History  Problem Relation Age of Onset  . Kidney disease Mother   . Cancer Father   . Stroke Brother   . Lung disease Brother   .  Rheum arthritis Daughter   . Rheum arthritis Daughter    Past Surgical History:  Procedure Laterality Date  . JOINT REPLACEMENT     BIL knee   . KNEE ARTHROPLASTY     Social History   Social History Narrative  . Not on file    There is no immunization history on file for  this patient.   Objective: Vital Signs: BP 136/69 (BP Location: Left Arm, Patient Position: Sitting, Cuff Size: Normal)   Pulse (!) 57   Resp 14   Ht 5' 2.5" (1.588 m)   Wt 166 lb (75.3 kg)   BMI 29.88 kg/m    Physical Exam Vitals signs and nursing note reviewed.  Constitutional:      Appearance: She is well-developed.  HENT:     Head: Normocephalic and atraumatic.  Eyes:     Conjunctiva/sclera: Conjunctivae normal.  Neck:     Musculoskeletal: Normal range of motion.  Cardiovascular:     Rate and Rhythm: Normal rate and regular rhythm.     Heart sounds: Normal heart sounds.  Pulmonary:     Effort: Pulmonary effort is normal.     Breath sounds: Normal breath sounds.  Abdominal:     General: Bowel sounds are normal.     Palpations: Abdomen is soft.  Lymphadenopathy:     Cervical: No cervical adenopathy.  Skin:    General: Skin is warm and dry.     Capillary Refill: Capillary refill takes less than 2 seconds.  Neurological:     Mental Status: She is alert and oriented to person, place, and time.  Psychiatric:        Behavior: Behavior normal.      Musculoskeletal Exam: C-spine, thoracic spine, and lumbar spine good ROM. No midline spinal tenderness.  No SI joint tenderness.  Shoulder joints, elbow joints, wrist joints, MCPs, PIPs, and DIPs good ROM with no synovitis. Synovial thickening of MCPs but no tenderness or synovitis noted.  Ulnar deviation bilaterally.   Tenderness on the anterior aspect of the shins.  No calf tenderness or tightness.  No tenderness or effusion of ankle joints.  No achilles tendonitis or plantar fasciitis.  No tenderness over trochanteric bursa bilaterally.     CDAI Exam: CDAI Score: 0.8  Patient Global Assessment: 4 (mm); Provider Global Assessment: 4 (mm) Swollen: 0 ; Tender: 0  Joint Exam   Not documented   There is currently no information documented on the homunculus. Go to the Rheumatology activity and complete the homunculus joint  exam.  Investigation: No additional findings.  Imaging: No results found.  Recent Labs: Lab Results  Component Value Date   WBC 4.6 05/22/2018   HGB 11.6 (L) 05/22/2018   PLT 183 05/22/2018   NA 139 05/22/2018   K 3.3 (L) 05/22/2018   CL 100 05/22/2018   CO2 32 05/22/2018   GLUCOSE 83 05/22/2018   BUN 19 05/22/2018   CREATININE 1.05 (H) 05/22/2018   BILITOT 0.5 05/22/2018   ALKPHOS 48 03/25/2017   AST 14 05/22/2018   ALT 7 05/22/2018   PROT 6.9 05/22/2018   ALBUMIN 3.8 03/25/2017   CALCIUM 9.1 05/22/2018   GFRAA 59 (L) 05/22/2018   QFTBGOLDPLUS NEGATIVE 05/22/2018    Speciality Comments: TB Gold: 03/25/17 Neg  Procedures:  No procedures performed Allergies: Patient has no known allergies.         Assessment / Plan:     Visit Diagnoses: Seropositive rheumatoid arthritis of multiple sites ([place]) - +RF,+anti CCP:  She has no synovitis on exam. She has synovial thickening of MCPs and ulnar deviation but no tenderness or synovitis.  She has not had any recent rheumatoid arthritis flares.  She is on Enbrel 50 mg sq injections once weekly.  She has not missed any doses recently.  She was recently diagnosed with pneumonia and started on antibiotics but did not hold her dose of Enbrel.  She was encouraged to follow up with PCP since she continues to have mild cough and congestion.  She was reminded that anytime she has an infection and/or placed on antibiotics she is to hold the dose of Enbrel until her symptoms have resolved. X-rays of both hands and feet were obtained on 05/22/18 that revealed findings of osteoarthritis and severe rheumatoid arthritis overlap. She was advised to notify us if she develops any signs or symptoms of a RA flare.  She will follow up in 5 months.   High risk medication use -  Enbrel 50 mg weekly. D/c MTX due to elevated creatinine.  Last TB gold negative on 05/22/2018 and will monitor yearly.  Most recent CBC/CMP showed borderline anemia but stable  creatinine on 05/22/2018.  Overdue for labs.  CBC/CMP ordered for today and will monitor every 3 months.  Standing orders are in place.  She was reminded to hold the dose of Enbrel in time she has infection or start on antibiotics until she is feeling better and/or has completed the course of antibiotics. - Plan: COMPLETE METABOLIC PANEL WITH GFR, CBC with Differential/Platelet  History of total knee replacement, bilateral: Chronic pain.  No warmth or effusion.  Good ROM bilaterally.  She is having radiating pain down the anterior aspect of both shinsShe has experiencing the shin tenderness for 2 months..  She has no calf tenderness.  She has not performed any overuse activities and has not had any recent injuries.     Age-related osteoporosis without current pathological fracture -She is on Forteo 20 mcg daily injections. 01/12/2017 DEXA showed T-score of -3.7 left distal radius. She was previously on Reclast and had 3 infusions.  She is on vitamin D 2000 units by mouth daily.  Last vitamin D level was 49 on 02/10/2017.  We will recheck vitamin D today. Next DEXA due in May 2020 at [place]. Future order for DEXA placed today.  She has had 2 recent falls and is going to PT for muscle strengthening and fall prevention.  She has not had any recent fractures.    - Plan: VITAMIN D 25 Hydroxy (Vit-D Deficiency, Fractures), DG BONE DENSITY (DXA)  Vitamin D deficiency -She is taking a vitamin D supplement.  We will check vitamin D level today.  Plan: VITAMIN D 25 Hydroxy (Vit-D Deficiency, Fractures), DG BONE DENSITY (DXA)  Subdeltoid bursitis of right shoulder joint: She has intermittent discomfort.  She has good ROM on exam.   Trapezius muscle spasm: She has trapezius muscle tension and muscle tenderness.    Other medical conditions are listed as follows:   History of gastroesophageal reflux (GERD)  History of hypertension    Orders: Orders Placed This Encounter  Procedures  . DG BONE DENSITY (DXA)  .  COMPLETE METABOLIC PANEL WITH GFR  . CBC with Differential/Platelet  . VITAMIN D 25 Hydroxy (Vit-D Deficiency, Fractures)   No orders of the defined types were placed in this encounter.     Follow-Up Instructions: Return in about 5 months (around 03/16/2019) for Rheumatoid arthritis, Osteoporosis.   Ofilia Neas, PA-C  I examined and evaluated the patient with Hazel Sams PA.  Patient has been tolerating medications well.  She had no synovitis on examination today.  She does have color radiation and synovial thickening.  She has been experiencing pain over bilateral shin area.  I believe it is related to her bilateral knee replacement.  She had no point tenderness on examination.  The plan of care was discussed as noted above.  Bo Merino, MD  Note - This record has been created using Editor, commissioning.  Chart creation errors have been sought, but may not always  have been located. Such creation errors do not reflect on  the standard of medical care.

## 2018-10-04 DIAGNOSIS — R296 Repeated falls: Secondary | ICD-10-CM | POA: Diagnosis not present

## 2018-10-04 DIAGNOSIS — M6281 Muscle weakness (generalized): Secondary | ICD-10-CM | POA: Diagnosis not present

## 2018-10-04 DIAGNOSIS — M810 Age-related osteoporosis without current pathological fracture: Secondary | ICD-10-CM | POA: Diagnosis not present

## 2018-10-04 DIAGNOSIS — R262 Difficulty in walking, not elsewhere classified: Secondary | ICD-10-CM | POA: Diagnosis not present

## 2018-10-06 DIAGNOSIS — M6281 Muscle weakness (generalized): Secondary | ICD-10-CM | POA: Diagnosis not present

## 2018-10-06 DIAGNOSIS — R262 Difficulty in walking, not elsewhere classified: Secondary | ICD-10-CM | POA: Diagnosis not present

## 2018-10-06 DIAGNOSIS — M810 Age-related osteoporosis without current pathological fracture: Secondary | ICD-10-CM | POA: Diagnosis not present

## 2018-10-06 DIAGNOSIS — R296 Repeated falls: Secondary | ICD-10-CM | POA: Diagnosis not present

## 2018-10-10 DIAGNOSIS — R262 Difficulty in walking, not elsewhere classified: Secondary | ICD-10-CM | POA: Diagnosis not present

## 2018-10-10 DIAGNOSIS — M810 Age-related osteoporosis without current pathological fracture: Secondary | ICD-10-CM | POA: Diagnosis not present

## 2018-10-10 DIAGNOSIS — M6281 Muscle weakness (generalized): Secondary | ICD-10-CM | POA: Diagnosis not present

## 2018-10-10 DIAGNOSIS — R296 Repeated falls: Secondary | ICD-10-CM | POA: Diagnosis not present

## 2018-10-12 DIAGNOSIS — R296 Repeated falls: Secondary | ICD-10-CM | POA: Diagnosis not present

## 2018-10-12 DIAGNOSIS — R262 Difficulty in walking, not elsewhere classified: Secondary | ICD-10-CM | POA: Diagnosis not present

## 2018-10-12 DIAGNOSIS — M810 Age-related osteoporosis without current pathological fracture: Secondary | ICD-10-CM | POA: Diagnosis not present

## 2018-10-12 DIAGNOSIS — M6281 Muscle weakness (generalized): Secondary | ICD-10-CM | POA: Diagnosis not present

## 2018-10-16 ENCOUNTER — Encounter: Payer: Self-pay | Admitting: Rheumatology

## 2018-10-16 ENCOUNTER — Ambulatory Visit (INDEPENDENT_AMBULATORY_CARE_PROVIDER_SITE_OTHER): Payer: Medicare Other | Admitting: Rheumatology

## 2018-10-16 VITALS — BP 136/69 | HR 57 | Resp 14 | Ht 62.5 in | Wt 166.0 lb

## 2018-10-16 DIAGNOSIS — Z8679 Personal history of other diseases of the circulatory system: Secondary | ICD-10-CM

## 2018-10-16 DIAGNOSIS — M810 Age-related osteoporosis without current pathological fracture: Secondary | ICD-10-CM

## 2018-10-16 DIAGNOSIS — Z79899 Other long term (current) drug therapy: Secondary | ICD-10-CM | POA: Diagnosis not present

## 2018-10-16 DIAGNOSIS — Z8719 Personal history of other diseases of the digestive system: Secondary | ICD-10-CM | POA: Diagnosis not present

## 2018-10-16 DIAGNOSIS — Z96653 Presence of artificial knee joint, bilateral: Secondary | ICD-10-CM

## 2018-10-16 DIAGNOSIS — M0579 Rheumatoid arthritis with rheumatoid factor of multiple sites without organ or systems involvement: Secondary | ICD-10-CM | POA: Diagnosis not present

## 2018-10-16 DIAGNOSIS — M7551 Bursitis of right shoulder: Secondary | ICD-10-CM

## 2018-10-16 DIAGNOSIS — E559 Vitamin D deficiency, unspecified: Secondary | ICD-10-CM

## 2018-10-16 DIAGNOSIS — M62838 Other muscle spasm: Secondary | ICD-10-CM | POA: Diagnosis not present

## 2018-10-16 DIAGNOSIS — Z8639 Personal history of other endocrine, nutritional and metabolic disease: Secondary | ICD-10-CM | POA: Diagnosis not present

## 2018-10-17 DIAGNOSIS — M810 Age-related osteoporosis without current pathological fracture: Secondary | ICD-10-CM | POA: Diagnosis not present

## 2018-10-17 DIAGNOSIS — R262 Difficulty in walking, not elsewhere classified: Secondary | ICD-10-CM | POA: Diagnosis not present

## 2018-10-17 DIAGNOSIS — M6281 Muscle weakness (generalized): Secondary | ICD-10-CM | POA: Diagnosis not present

## 2018-10-17 DIAGNOSIS — R296 Repeated falls: Secondary | ICD-10-CM | POA: Diagnosis not present

## 2018-10-17 LAB — COMPLETE METABOLIC PANEL WITH GFR
AG Ratio: 1 (calc) (ref 1.0–2.5)
ALT: 7 U/L (ref 6–29)
AST: 16 U/L (ref 10–35)
Albumin: 3.7 g/dL (ref 3.6–5.1)
Alkaline phosphatase (APISO): 63 U/L (ref 37–153)
BUN/Creatinine Ratio: 19 (calc) (ref 6–22)
BUN: 20 mg/dL (ref 7–25)
CALCIUM: 9.5 mg/dL (ref 8.6–10.4)
CO2: 31 mmol/L (ref 20–32)
CREATININE: 1.07 mg/dL — AB (ref 0.60–0.93)
Chloride: 101 mmol/L (ref 98–110)
GFR, EST AFRICAN AMERICAN: 57 mL/min/{1.73_m2} — AB (ref >=60)
GFR, EST NON AFRICAN AMERICAN: 49 mL/min/{1.73_m2} — AB (ref >=60)
GLOBULIN: 3.7 g/dL (ref 1.9–3.7)
GLUCOSE: 80 mg/dL (ref 65–99)
Potassium: 3.8 mmol/L (ref 3.5–5.3)
SODIUM: 139 mmol/L (ref 135–146)
TOTAL PROTEIN: 7.4 g/dL (ref 6.1–8.1)
Total Bilirubin: 0.3 mg/dL (ref 0.2–1.2)

## 2018-10-17 LAB — CBC WITH DIFFERENTIAL/PLATELET
ABSOLUTE MONOCYTES: 368 {cells}/uL (ref 200–950)
BASOS ABS: 28 {cells}/uL (ref 0–200)
BASOS PCT: 0.7 %
EOS ABS: 388 {cells}/uL (ref 15–500)
Eosinophils Relative: 9.7 %
HCT: 35.1 % (ref 35.0–45.0)
Hemoglobin: 12.2 g/dL (ref 11.7–15.5)
Lymphs Abs: 1800 cells/uL (ref 850–3900)
MCH: 31.4 pg (ref 27.0–33.0)
MCHC: 34.8 g/dL (ref 32.0–36.0)
MCV: 90.5 fL (ref 80.0–100.0)
MPV: 9.6 fL (ref 7.5–12.5)
Monocytes Relative: 9.2 %
Neutro Abs: 1416 cells/uL — ABNORMAL LOW (ref 1500–7800)
Neutrophils Relative %: 35.4 %
PLATELETS: 202 10*3/uL (ref 140–400)
RBC: 3.88 10*6/uL (ref 3.80–5.10)
RDW: 12.4 % (ref 11.0–15.0)
TOTAL LYMPHOCYTE: 45 %
WBC: 4 10*3/uL (ref 3.8–10.8)

## 2018-10-17 LAB — VITAMIN D 25 HYDROXY (VIT D DEFICIENCY, FRACTURES): VIT D 25 HYDROXY: 56 ng/mL (ref 30–100)

## 2018-10-17 NOTE — Progress Notes (Signed)
Creatinine continues to trend up.  GFR is 49.  Please advise patient to avoid NSAIDs.  Please notify patient and forward results to PCP.  Absolute neutrophils borderline low. Rest of CBC WNL.  Vitamin D is 56-within desirable range.

## 2018-10-19 DIAGNOSIS — M6281 Muscle weakness (generalized): Secondary | ICD-10-CM | POA: Diagnosis not present

## 2018-10-19 DIAGNOSIS — M810 Age-related osteoporosis without current pathological fracture: Secondary | ICD-10-CM | POA: Diagnosis not present

## 2018-10-19 DIAGNOSIS — R296 Repeated falls: Secondary | ICD-10-CM | POA: Diagnosis not present

## 2018-10-19 DIAGNOSIS — R262 Difficulty in walking, not elsewhere classified: Secondary | ICD-10-CM | POA: Diagnosis not present

## 2018-10-20 DIAGNOSIS — N183 Chronic kidney disease, stage 3 (moderate): Secondary | ICD-10-CM | POA: Diagnosis not present

## 2018-10-20 DIAGNOSIS — E785 Hyperlipidemia, unspecified: Secondary | ICD-10-CM | POA: Diagnosis not present

## 2018-10-20 DIAGNOSIS — I129 Hypertensive chronic kidney disease with stage 1 through stage 4 chronic kidney disease, or unspecified chronic kidney disease: Secondary | ICD-10-CM | POA: Diagnosis not present

## 2018-10-20 DIAGNOSIS — R7303 Prediabetes: Secondary | ICD-10-CM | POA: Diagnosis not present

## 2018-10-24 DIAGNOSIS — R262 Difficulty in walking, not elsewhere classified: Secondary | ICD-10-CM | POA: Diagnosis not present

## 2018-10-24 DIAGNOSIS — R296 Repeated falls: Secondary | ICD-10-CM | POA: Diagnosis not present

## 2018-10-24 DIAGNOSIS — M6281 Muscle weakness (generalized): Secondary | ICD-10-CM | POA: Diagnosis not present

## 2018-10-24 DIAGNOSIS — M810 Age-related osteoporosis without current pathological fracture: Secondary | ICD-10-CM | POA: Diagnosis not present

## 2018-10-26 DIAGNOSIS — R262 Difficulty in walking, not elsewhere classified: Secondary | ICD-10-CM | POA: Diagnosis not present

## 2018-10-26 DIAGNOSIS — R296 Repeated falls: Secondary | ICD-10-CM | POA: Diagnosis not present

## 2018-10-26 DIAGNOSIS — M6281 Muscle weakness (generalized): Secondary | ICD-10-CM | POA: Diagnosis not present

## 2018-10-26 DIAGNOSIS — M810 Age-related osteoporosis without current pathological fracture: Secondary | ICD-10-CM | POA: Diagnosis not present

## 2018-10-30 DIAGNOSIS — R296 Repeated falls: Secondary | ICD-10-CM | POA: Diagnosis not present

## 2018-10-30 DIAGNOSIS — M6281 Muscle weakness (generalized): Secondary | ICD-10-CM | POA: Diagnosis not present

## 2018-10-30 DIAGNOSIS — M810 Age-related osteoporosis without current pathological fracture: Secondary | ICD-10-CM | POA: Diagnosis not present

## 2018-10-30 DIAGNOSIS — R262 Difficulty in walking, not elsewhere classified: Secondary | ICD-10-CM | POA: Diagnosis not present

## 2018-11-13 DIAGNOSIS — I129 Hypertensive chronic kidney disease with stage 1 through stage 4 chronic kidney disease, or unspecified chronic kidney disease: Secondary | ICD-10-CM | POA: Diagnosis not present

## 2018-11-13 DIAGNOSIS — R7303 Prediabetes: Secondary | ICD-10-CM | POA: Diagnosis not present

## 2018-11-13 DIAGNOSIS — E785 Hyperlipidemia, unspecified: Secondary | ICD-10-CM | POA: Diagnosis not present

## 2018-11-21 DIAGNOSIS — N183 Chronic kidney disease, stage 3 (moderate): Secondary | ICD-10-CM | POA: Diagnosis not present

## 2018-11-21 DIAGNOSIS — I129 Hypertensive chronic kidney disease with stage 1 through stage 4 chronic kidney disease, or unspecified chronic kidney disease: Secondary | ICD-10-CM | POA: Diagnosis not present

## 2018-11-21 DIAGNOSIS — R7303 Prediabetes: Secondary | ICD-10-CM | POA: Diagnosis not present

## 2018-11-21 DIAGNOSIS — E785 Hyperlipidemia, unspecified: Secondary | ICD-10-CM | POA: Diagnosis not present

## 2018-11-23 DIAGNOSIS — E785 Hyperlipidemia, unspecified: Secondary | ICD-10-CM | POA: Diagnosis not present

## 2018-11-23 DIAGNOSIS — I129 Hypertensive chronic kidney disease with stage 1 through stage 4 chronic kidney disease, or unspecified chronic kidney disease: Secondary | ICD-10-CM | POA: Diagnosis not present

## 2018-11-23 DIAGNOSIS — R7303 Prediabetes: Secondary | ICD-10-CM | POA: Diagnosis not present

## 2018-11-23 DIAGNOSIS — N183 Chronic kidney disease, stage 3 (moderate): Secondary | ICD-10-CM | POA: Diagnosis not present

## 2018-11-30 DIAGNOSIS — Z0000 Encounter for general adult medical examination without abnormal findings: Secondary | ICD-10-CM | POA: Diagnosis not present

## 2018-11-30 DIAGNOSIS — Z139 Encounter for screening, unspecified: Secondary | ICD-10-CM | POA: Diagnosis not present

## 2018-11-30 DIAGNOSIS — Z1331 Encounter for screening for depression: Secondary | ICD-10-CM | POA: Diagnosis not present

## 2018-12-21 DIAGNOSIS — E785 Hyperlipidemia, unspecified: Secondary | ICD-10-CM | POA: Diagnosis not present

## 2018-12-21 DIAGNOSIS — N183 Chronic kidney disease, stage 3 (moderate): Secondary | ICD-10-CM | POA: Diagnosis not present

## 2018-12-21 DIAGNOSIS — R7303 Prediabetes: Secondary | ICD-10-CM | POA: Diagnosis not present

## 2018-12-21 DIAGNOSIS — I129 Hypertensive chronic kidney disease with stage 1 through stage 4 chronic kidney disease, or unspecified chronic kidney disease: Secondary | ICD-10-CM | POA: Diagnosis not present

## 2019-01-19 DIAGNOSIS — N183 Chronic kidney disease, stage 3 (moderate): Secondary | ICD-10-CM | POA: Diagnosis not present

## 2019-01-19 DIAGNOSIS — R7303 Prediabetes: Secondary | ICD-10-CM | POA: Diagnosis not present

## 2019-01-19 DIAGNOSIS — I129 Hypertensive chronic kidney disease with stage 1 through stage 4 chronic kidney disease, or unspecified chronic kidney disease: Secondary | ICD-10-CM | POA: Diagnosis not present

## 2019-01-19 DIAGNOSIS — E785 Hyperlipidemia, unspecified: Secondary | ICD-10-CM | POA: Diagnosis not present

## 2019-01-31 ENCOUNTER — Telehealth: Payer: Self-pay | Admitting: Rheumatology

## 2019-01-31 NOTE — Telephone Encounter (Signed)
Patient left a voicemail requesting a return call to let her know when her labwork is due.

## 2019-02-01 ENCOUNTER — Telehealth: Payer: Self-pay | Admitting: Rheumatology

## 2019-02-01 DIAGNOSIS — Z96653 Presence of artificial knee joint, bilateral: Secondary | ICD-10-CM | POA: Diagnosis not present

## 2019-02-01 DIAGNOSIS — M810 Age-related osteoporosis without current pathological fracture: Secondary | ICD-10-CM | POA: Diagnosis not present

## 2019-02-01 DIAGNOSIS — R921 Mammographic calcification found on diagnostic imaging of breast: Secondary | ICD-10-CM | POA: Diagnosis not present

## 2019-02-01 DIAGNOSIS — M069 Rheumatoid arthritis, unspecified: Secondary | ICD-10-CM | POA: Diagnosis not present

## 2019-02-01 NOTE — Telephone Encounter (Signed)
Patient returned call.  States she is still having some congestion but it has improved greatly.  Her congestion started 2 months ago.  Advised her to follow up with PCP to rule out infection prior to starting Enbrel.  She has been off of Enbrel for 2 months.  Informed patient that if she is off for 3 months she will need to have next dose administered in office due to risk of allergic reaciton.  Patient verbalized understanding.  Advised she may resume Forteo.  She was started in July 2018 so has 1 more month of treatment.  All questions encouraged and answered.  Instructed patient to call with any further questions or concerns.  Mariella Saa, PharmD, [facility] Rheumatology Clinical Pharmacist  02/01/2019 2:18 PM

## 2019-02-01 NOTE — Telephone Encounter (Signed)
Please ensure the patient's symptoms have completely resolved.  If her symptoms persist she should follow up with PCP and should be cleared before restarting on Enbrel.  She is ok to restart on Forteo.   Please clarify how long the patient has been off of Enbrel.  If it has been 3 months or longer please schedule a nurse visit for the administration of the injection.  She is also due to update lab work.

## 2019-02-01 NOTE — Telephone Encounter (Signed)
Patient stopped taking Enbrel and Forteo a couple of months ago because she started to experience a cough, and congestion. Patient would like to know if she can go back on medication now that symptoms have decreased. Please call to advise.

## 2019-02-01 NOTE — Telephone Encounter (Signed)
LVM

## 2019-02-02 NOTE — Telephone Encounter (Signed)
Amber Yopp spoke to patient.

## 2019-02-20 DIAGNOSIS — R7303 Prediabetes: Secondary | ICD-10-CM | POA: Diagnosis not present

## 2019-02-20 DIAGNOSIS — E785 Hyperlipidemia, unspecified: Secondary | ICD-10-CM | POA: Diagnosis not present

## 2019-02-20 DIAGNOSIS — N183 Chronic kidney disease, stage 3 (moderate): Secondary | ICD-10-CM | POA: Diagnosis not present

## 2019-02-20 DIAGNOSIS — I129 Hypertensive chronic kidney disease with stage 1 through stage 4 chronic kidney disease, or unspecified chronic kidney disease: Secondary | ICD-10-CM | POA: Diagnosis not present

## 2019-02-28 NOTE — Progress Notes (Signed)
Office Visit Note  Patient: Andrea [name]             Date of Birth: [birth year]           MRN: [id number]             PCP: Marco Collie, MD Referring: Marco Collie, MD Visit Date: 03/12/2019 Occupation: @GUAROCC @  Subjective:  Discuss DEXA   History of Present Illness: Andrea [name] is a 78 y.o. female with history of seropositive rheumatoid arthritis and osteoporosis.  She is on Enbrel 50 mg sq injections every week. She denies any recent rheumatoid arthritis flares.  She reports pain in both knee joints, which are replaced.  She denies any joint swelling. She held Enbrel and forteo for 2 months while getting over bronchitis.  She is feeling better and is asymptomatic at this time. She restarted both medications 2 weeks ago.  She will be completing the 2-year course of Forteo in August 2020. Prior to [facility] she was receiving IV Reclast infusions.    Activities of Daily Living:  Patient reports morning stiffness for 0 minutes.   Patient Denies nocturnal pain.  Difficulty dressing/grooming: Denies Difficulty climbing stairs: Reports Difficulty getting out of chair: Denies Difficulty using hands for taps, buttons, cutlery, and/or writing: Reports  Review of Systems  Constitutional: Negative for fatigue.  HENT: Negative for mouth sores, sore throat, mouth dryness and nose dryness.   Eyes: Negative for pain, itching, visual disturbance and dryness.  Respiratory: Negative for cough, hemoptysis, shortness of breath, wheezing and difficulty breathing.   Cardiovascular: Negative for chest pain, palpitations, hypertension and swelling in legs/feet.  Gastrointestinal: Negative for blood in stool, constipation and diarrhea.  Endocrine: Negative for increased urination.  Genitourinary: Negative for painful urination and pelvic pain.  Musculoskeletal: Positive for arthralgias and joint pain. Negative for joint swelling, myalgias, muscle weakness, morning stiffness, muscle tenderness and  myalgias.  Skin: Negative for color change, pallor, rash, hair loss, nodules/bumps, redness, skin tightness, ulcers and sensitivity to sunlight.  Allergic/Immunologic: Negative for susceptible to infections.  Neurological: Negative for dizziness, light-headedness, numbness, headaches, memory loss and weakness.  Hematological: Negative for swollen glands.  Psychiatric/Behavioral: Negative for depressed mood, confusion and sleep disturbance. The patient is not nervous/anxious.     PMFS History:  Patient Active Problem List   Diagnosis Date Noted  . History of hypertension 02/08/2017  . History of gastroesophageal reflux (GERD) 02/08/2017  . High risk medication use 12/27/2016  . Vitamin D deficiency 12/27/2016  . History of total knee replacement, bilateral 08/27/2016  . Age-related osteoporosis without current pathological fracture 08/27/2016  . Seropositive rheumatoid arthritis of multiple sites ([place]) 06/21/2016    Past Medical History:  Diagnosis Date  . Arthritis   . Hypertension     Family History  Problem Relation Age of Onset  . Kidney disease Mother   . Cancer Father   . Stroke Brother   . Lung disease Brother   . Rheum arthritis Daughter   . Rheum arthritis Daughter    Past Surgical History:  Procedure Laterality Date  . JOINT REPLACEMENT     BIL knee   . KNEE ARTHROPLASTY     Social History   Social History Narrative  . Not on file    There is no immunization history on file for this patient.   Objective: Vital Signs: BP 138/82 (BP Location: Left Arm, Patient Position: Sitting, Cuff Size: Normal)   Pulse 64   Resp 14   Ht  5\' 3"  (1.6 m)   Wt 166 lb 12.8 oz (75.7 kg)   BMI 29.55 kg/m    Physical Exam Vitals signs and nursing note reviewed.  Constitutional:      Appearance: She is well-developed.  HENT:     Head: Normocephalic and atraumatic.  Eyes:     Conjunctiva/sclera: Conjunctivae normal.  Neck:     Musculoskeletal: Normal range of motion.   Cardiovascular:     Rate and Rhythm: Normal rate and regular rhythm.     Heart sounds: Normal heart sounds.  Pulmonary:     Effort: Pulmonary effort is normal.     Breath sounds: Normal breath sounds.  Abdominal:     General: Bowel sounds are normal.     Palpations: Abdomen is soft.  Lymphadenopathy:     Cervical: No cervical adenopathy.  Skin:    General: Skin is warm and dry.     Capillary Refill: Capillary refill takes less than 2 seconds.  Neurological:     Mental Status: She is alert and oriented to person, place, and time.  Psychiatric:        Behavior: Behavior normal.      Musculoskeletal Exam: C-spine good ROM.  Thoracic kyphosis noted.  Good ROM of lumbar spine good.  Shoulder joints, with joints, wrist joints, MCPs, PIPs and DIPs have good range of motion with no synovitis.  Ulnar deviation bilaterally.  Limited ROM of the right hip joint.  Bilateral knee joint replacements have good ROM with no warmth or effusion.  No tenderness or inflammation of her ankle joints are noted on exam today.  CDAI Exam: CDAI Score: - Patient Global: -; Provider Global: - Swollen: -; Tender: - Joint Exam   No joint exam has been documented for this visit   There is currently no information documented on the homunculus. Go to the Rheumatology activity and complete the homunculus joint exam.  Investigation: No additional findings.  Imaging: No results found.  Recent Labs: Lab Results  Component Value Date   WBC 4.0 10/16/2018   HGB 12.2 10/16/2018   PLT 202 10/16/2018   NA 139 10/16/2018   K 3.8 10/16/2018   CL 101 10/16/2018   CO2 31 10/16/2018   GLUCOSE 80 10/16/2018   BUN 20 10/16/2018   CREATININE 1.07 (H) 10/16/2018   BILITOT 0.3 10/16/2018   ALKPHOS 48 03/25/2017   AST 16 10/16/2018   ALT 7 10/16/2018   PROT 7.4 10/16/2018   ALBUMIN 3.8 03/25/2017   CALCIUM 9.5 10/16/2018   GFRAA 57 (L) 10/16/2018   QFTBGOLDPLUS NEGATIVE 05/22/2018    Speciality Comments: TB  Gold: 03/25/17 Neg  Procedures:  No procedures performed Allergies: Patient has no known allergies.      Assessment / Plan:     Visit Diagnoses: Seropositive rheumatoid arthritis of multiple sites ([place]) - +RF,+anti CCP: She has no synovitis on exam.  She has not had any recent rheumatoid arthritis flares.  She is clinically doing well on Enbrel 50 mg subcutaneous injections every week.  She recently missed 2 months of Enbrel due to being diagnosed with bronchitis.  She restarted on Enbrel 2 weeks ago.  She did not experience increased joint pain or joint swelling while on Enbrel.  She is occasional discomfort in bilateral knee replacements but denies any joint swelling.  She will continue on Enbrel 50 mg subcutaneous injections once weekly.  She does not need any refills at this time.  She was advised to notify us if she  develops increased joint pain or joint swelling.  She will follow-up in the office in October 2020.   High risk medication use -Enbrel SureClick 50 mg every 7 days.  Last TB gold negative on 05/22/2018 and will monitor yearly. Future order for TB gold was placed today. Most recent CBC/CMP within normal limits except for elevated creatinine on 10/16/2018.  She is overdue for labs.  Due for CBC/CMP today and will monitor every 3 months.  Standing orders are in place.  We discussed the importance of holding Enbrel anytime she has an infection and to resume once the infection is cleared.  I also discussed the importance of scheduling yearly skin exams while on Enbrel due to the increased risk of melanoma.  D/c MTX due to elevated creatinine.   - Plan: CBC with Differential/Platelet, COMPLETE METABOLIC PANEL WITH GFR, QuantiFERON-TB Gold Plus  History of total knee replacement, bilateral - Chronic pain.  She has good ROM with no warmth or effusion.    Age-related osteoporosis without current pathological fracture - She is on Forteo 20 mcg daily injections.  She held [facility] for 2 months and  restarted 2 weeks ago due to thinking she needed to discontinue well having an infection.  She was previously supposed to discontinue Forteo in August 2020 due to reaching the maximum 2-year regimen.  She will extend the Forteo injections until October 2020 at which time she will discontinue.  We will then transition her back to Reclast.  She will follow-up in the office in October and we will further discuss the indications, contraindications, potential side effects of Reclast at that time. She was previously on Reclast and had 3 infusions.   History of vitamin D deficiency - She is taking a vitamin D supplement.   Other medical conditions are listed as follows:  History of gastroesophageal reflux (GERD)   History of hypertension   Subdeltoid bursitis of right shoulder joint   Trapezius muscle spasm   Orders: Orders Placed This Encounter  Procedures  . CBC with Differential/Platelet  . COMPLETE METABOLIC PANEL WITH GFR  . QuantiFERON-TB Gold Plus   No orders of the defined types were placed in this encounter.   Face-to-face time spent with patient was 30 minutes. Greater than 50% of time was spent in counseling and coordination of care.  Follow-Up Instructions: Return for Rheumatoid arthritis, Osteoporosis.   Ofilia Neas, PA-C   I examined and evaluated the patient with Hazel Sams PA.  I reviewed the bone density with the patient.  She had minimal improvement on Forteo so far.  She states she has missed couple of doses of Forteo as she was sick.  She still has few months to finish her complete Forteo course.  We had detailed discussion about resuming Reclast after she finishes Forteo.  She was in agreement.  We will discuss that at follow-up visit.  As far as rheumatoid arthritis is concerned she has synovial thickening on examination but no synovitis.  She appears to be doing well on current regimen.  The plan of care was discussed as noted above.  Bo Merino, MD  Note -  This record has been created using Editor, commissioning.  Chart creation errors have been sought, but may not always  have been located. Such creation errors do not reflect on  the standard of medical care.

## 2019-03-12 ENCOUNTER — Other Ambulatory Visit: Payer: Self-pay

## 2019-03-12 ENCOUNTER — Encounter: Payer: Self-pay | Admitting: Physician Assistant

## 2019-03-12 ENCOUNTER — Ambulatory Visit (INDEPENDENT_AMBULATORY_CARE_PROVIDER_SITE_OTHER): Payer: Medicare Other | Admitting: Rheumatology

## 2019-03-12 VITALS — BP 138/82 | HR 64 | Resp 14 | Ht 63.0 in | Wt 166.8 lb

## 2019-03-12 DIAGNOSIS — Z8639 Personal history of other endocrine, nutritional and metabolic disease: Secondary | ICD-10-CM

## 2019-03-12 DIAGNOSIS — Z8719 Personal history of other diseases of the digestive system: Secondary | ICD-10-CM

## 2019-03-12 DIAGNOSIS — Z8679 Personal history of other diseases of the circulatory system: Secondary | ICD-10-CM | POA: Diagnosis not present

## 2019-03-12 DIAGNOSIS — M62838 Other muscle spasm: Secondary | ICD-10-CM | POA: Diagnosis not present

## 2019-03-12 DIAGNOSIS — M7551 Bursitis of right shoulder: Secondary | ICD-10-CM | POA: Diagnosis not present

## 2019-03-12 DIAGNOSIS — Z96653 Presence of artificial knee joint, bilateral: Secondary | ICD-10-CM | POA: Diagnosis not present

## 2019-03-12 DIAGNOSIS — M810 Age-related osteoporosis without current pathological fracture: Secondary | ICD-10-CM | POA: Diagnosis not present

## 2019-03-12 DIAGNOSIS — M0579 Rheumatoid arthritis with rheumatoid factor of multiple sites without organ or systems involvement: Secondary | ICD-10-CM | POA: Diagnosis not present

## 2019-03-12 DIAGNOSIS — Z79899 Other long term (current) drug therapy: Secondary | ICD-10-CM

## 2019-03-12 NOTE — Patient Instructions (Signed)
Continue to take Forteo until next office visit.  Standing Labs We placed an order today for your standing lab work.    Please come back and get your standing labs in October and then every 3 months.  We have open lab daily Monday through Thursday from 8:30-12:30 PM and 1:30-4:30 PM and Friday from 8:30-12:30 PM and 1:30 -4:00 PM at the office of Dr. Bo Merino.   You may experience shorter wait times on Monday and Friday afternoons. The office is located at [address] [place], [place], [place] [address] No appointment is necessary.   Labs are drawn by [facility].  You may receive a bill from [facility] for your lab work.  If you wish to have your labs drawn at another location, please call the office 24 hours in advance to send orders.  If you have any questions regarding directions or hours of operation,  please call [phone].   Just as a reminder please drink plenty of water prior to coming for your lab work. Thanks!

## 2019-03-13 LAB — CBC WITH DIFFERENTIAL/PLATELET
Absolute Monocytes: 482 cells/uL (ref 200–950)
Basophils Absolute: 42 cells/uL (ref 0–200)
Basophils Relative: 0.8 %
Eosinophils Absolute: 482 cells/uL (ref 15–500)
Eosinophils Relative: 9.1 %
HCT: 37.3 % (ref 35.0–45.0)
Hemoglobin: 12.8 g/dL (ref 11.7–15.5)
Lymphs Abs: 1982 cells/uL (ref 850–3900)
MCH: 31.1 pg (ref 27.0–33.0)
MCHC: 34.3 g/dL (ref 32.0–36.0)
MCV: 90.5 fL (ref 80.0–100.0)
MPV: 9.3 fL (ref 7.5–12.5)
Monocytes Relative: 9.1 %
Neutro Abs: 2311 cells/uL (ref 1500–7800)
Neutrophils Relative %: 43.6 %
Platelets: 205 10*3/uL (ref 140–400)
RBC: 4.12 10*6/uL (ref 3.80–5.10)
RDW: 12.8 % (ref 11.0–15.0)
Total Lymphocyte: 37.4 %
WBC: 5.3 10*3/uL (ref 3.8–10.8)

## 2019-03-13 LAB — COMPLETE METABOLIC PANEL WITH GFR
AG Ratio: 1 (calc) (ref 1.0–2.5)
ALT: 7 U/L (ref 6–29)
AST: 15 U/L (ref 10–35)
Albumin: 3.9 g/dL (ref 3.6–5.1)
Alkaline phosphatase (APISO): 62 U/L (ref 37–153)
BUN/Creatinine Ratio: 15 (calc) (ref 6–22)
BUN: 18 mg/dL (ref 7–25)
CO2: 28 mmol/L (ref 20–32)
Calcium: 9.6 mg/dL (ref 8.6–10.4)
Chloride: 100 mmol/L (ref 98–110)
Creat: 1.21 mg/dL — ABNORMAL HIGH (ref 0.60–0.93)
GFR, Est African American: 49 mL/min/{1.73_m2} — ABNORMAL LOW (ref >=60)
GFR, Est Non African American: 43 mL/min/{1.73_m2} — ABNORMAL LOW (ref >=60)
Globulin: 3.9 g/dL (calc) — ABNORMAL HIGH (ref 1.9–3.7)
Glucose, Bld: 81 mg/dL (ref 65–99)
Potassium: 3.5 mmol/L (ref 3.5–5.3)
Sodium: 138 mmol/L (ref 135–146)
Total Bilirubin: 0.7 mg/dL (ref 0.2–1.2)
Total Protein: 7.8 g/dL (ref 6.1–8.1)

## 2019-03-13 NOTE — Progress Notes (Signed)
Creatinine continues to trend up and GFR is 49.  Please advise patient to avoid NSAIDs and forward labs to PCP.  CBC WNL.

## 2019-03-16 ENCOUNTER — Ambulatory Visit: Payer: Self-pay | Admitting: Rheumatology

## 2019-03-22 DIAGNOSIS — E785 Hyperlipidemia, unspecified: Secondary | ICD-10-CM | POA: Diagnosis not present

## 2019-03-22 DIAGNOSIS — R7303 Prediabetes: Secondary | ICD-10-CM | POA: Diagnosis not present

## 2019-03-22 DIAGNOSIS — I129 Hypertensive chronic kidney disease with stage 1 through stage 4 chronic kidney disease, or unspecified chronic kidney disease: Secondary | ICD-10-CM | POA: Diagnosis not present

## 2019-03-23 DIAGNOSIS — I129 Hypertensive chronic kidney disease with stage 1 through stage 4 chronic kidney disease, or unspecified chronic kidney disease: Secondary | ICD-10-CM | POA: Diagnosis not present

## 2019-03-23 DIAGNOSIS — E785 Hyperlipidemia, unspecified: Secondary | ICD-10-CM | POA: Diagnosis not present

## 2019-03-23 DIAGNOSIS — R7303 Prediabetes: Secondary | ICD-10-CM | POA: Diagnosis not present

## 2019-03-23 DIAGNOSIS — N183 Chronic kidney disease, stage 3 (moderate): Secondary | ICD-10-CM | POA: Diagnosis not present

## 2019-03-27 ENCOUNTER — Ambulatory Visit: Payer: Managed Care, Other (non HMO) | Admitting: Rheumatology

## 2019-03-29 DIAGNOSIS — I129 Hypertensive chronic kidney disease with stage 1 through stage 4 chronic kidney disease, or unspecified chronic kidney disease: Secondary | ICD-10-CM | POA: Diagnosis not present

## 2019-03-29 DIAGNOSIS — Z139 Encounter for screening, unspecified: Secondary | ICD-10-CM | POA: Diagnosis not present

## 2019-03-29 DIAGNOSIS — R339 Retention of urine, unspecified: Secondary | ICD-10-CM | POA: Diagnosis not present

## 2019-03-29 DIAGNOSIS — N183 Chronic kidney disease, stage 3 (moderate): Secondary | ICD-10-CM | POA: Diagnosis not present

## 2019-03-29 DIAGNOSIS — E785 Hyperlipidemia, unspecified: Secondary | ICD-10-CM | POA: Diagnosis not present

## 2019-04-05 DIAGNOSIS — Z6829 Body mass index (BMI) 29.0-29.9, adult: Secondary | ICD-10-CM | POA: Diagnosis not present

## 2019-04-05 DIAGNOSIS — R339 Retention of urine, unspecified: Secondary | ICD-10-CM | POA: Diagnosis not present

## 2019-04-05 DIAGNOSIS — K5900 Constipation, unspecified: Secondary | ICD-10-CM | POA: Diagnosis not present

## 2019-04-19 DIAGNOSIS — Z1211 Encounter for screening for malignant neoplasm of colon: Secondary | ICD-10-CM | POA: Diagnosis not present

## 2019-04-19 DIAGNOSIS — K219 Gastro-esophageal reflux disease without esophagitis: Secondary | ICD-10-CM | POA: Diagnosis not present

## 2019-04-19 DIAGNOSIS — Z6829 Body mass index (BMI) 29.0-29.9, adult: Secondary | ICD-10-CM | POA: Diagnosis not present

## 2019-04-19 DIAGNOSIS — K5900 Constipation, unspecified: Secondary | ICD-10-CM | POA: Diagnosis not present

## 2019-04-23 DIAGNOSIS — N183 Chronic kidney disease, stage 3 (moderate): Secondary | ICD-10-CM | POA: Diagnosis not present

## 2019-04-23 DIAGNOSIS — E785 Hyperlipidemia, unspecified: Secondary | ICD-10-CM | POA: Diagnosis not present

## 2019-04-23 DIAGNOSIS — K219 Gastro-esophageal reflux disease without esophagitis: Secondary | ICD-10-CM | POA: Diagnosis not present

## 2019-04-23 DIAGNOSIS — I129 Hypertensive chronic kidney disease with stage 1 through stage 4 chronic kidney disease, or unspecified chronic kidney disease: Secondary | ICD-10-CM | POA: Diagnosis not present

## 2019-05-23 DIAGNOSIS — E785 Hyperlipidemia, unspecified: Secondary | ICD-10-CM | POA: Diagnosis not present

## 2019-05-23 DIAGNOSIS — K219 Gastro-esophageal reflux disease without esophagitis: Secondary | ICD-10-CM | POA: Diagnosis not present

## 2019-05-23 DIAGNOSIS — N183 Chronic kidney disease, stage 3 (moderate): Secondary | ICD-10-CM | POA: Diagnosis not present

## 2019-05-23 DIAGNOSIS — I129 Hypertensive chronic kidney disease with stage 1 through stage 4 chronic kidney disease, or unspecified chronic kidney disease: Secondary | ICD-10-CM | POA: Diagnosis not present

## 2019-05-29 NOTE — Progress Notes (Signed)
Office Visit Note  Patient: Andrea [name]             Date of Birth: Jun 04, 1941           MRN: [id number]             PCP: Marco Collie, MD Referring: Marco Collie, MD Visit Date: 06/12/2019 Occupation: @GUAROCC @  Subjective:  Discuss medications    History of Present Illness: Andrea [name] is a 78 y.o. female with history of seropositive rheumatoid arthritis and osteoporosis.  She is on Enbrel 50 mg sq weekly injections. She is due for her next injection but will require a sample and refill today.  She denies any recent rheumatoid arthritis flares.  She states she has intermittent pain in both ankle joints and both wrist joints.  She states overall her knee replacements are doing well, but she experiences occasional discomfort in the right knee joint.   She completed 2 years of forteo on 06/09/19.    Activities of Daily Living:  Patient reports morning stiffness for 0 minutes.   Patient Denies nocturnal pain.  Difficulty dressing/grooming: Denies Difficulty climbing stairs: Reports Difficulty getting out of chair: Reports Difficulty using hands for taps, buttons, cutlery, and/or writing: Reports  Review of Systems  Constitutional: Positive for fatigue.  HENT: Negative for mouth sores, mouth dryness and nose dryness.   Eyes: Negative for pain, visual disturbance and dryness.  Respiratory: Negative for cough, hemoptysis, shortness of breath and difficulty breathing.   Cardiovascular: Negative for chest pain, palpitations, hypertension and swelling in legs/feet.  Gastrointestinal: Positive for constipation. Negative for blood in stool and diarrhea.  Endocrine: Negative for increased urination.  Genitourinary: Negative for painful urination.  Musculoskeletal: Positive for arthralgias and joint pain. Negative for joint swelling, myalgias, muscle weakness, morning stiffness, muscle tenderness and myalgias.  Skin: Negative for color change, pallor, rash, hair loss, nodules/bumps,  skin tightness, ulcers and sensitivity to sunlight.  Allergic/Immunologic: Negative for susceptible to infections.  Neurological: Negative for dizziness, numbness, headaches and weakness.  Hematological: Negative for swollen glands.  Psychiatric/Behavioral: Negative for depressed mood and sleep disturbance. The patient is not nervous/anxious.     PMFS History:  Patient Active Problem List   Diagnosis Date Noted  . History of hypertension 02/08/2017  . History of gastroesophageal reflux (GERD) 02/08/2017  . High risk medication use 12/27/2016  . Vitamin D deficiency 12/27/2016  . History of total knee replacement, bilateral 08/27/2016  . Age-related osteoporosis without current pathological fracture 08/27/2016  . Seropositive rheumatoid arthritis of multiple sites ([place]) 06/21/2016    Past Medical History:  Diagnosis Date  . Arthritis   . Hypertension     Family History  Problem Relation Age of Onset  . Kidney disease Mother   . Cancer Father   . Stroke Brother   . Lung disease Brother   . Rheum arthritis Daughter   . Rheum arthritis Daughter    Past Surgical History:  Procedure Laterality Date  . JOINT REPLACEMENT     BIL knee   . KNEE ARTHROPLASTY     Social History   Social History Narrative  . Not on file    There is no immunization history on file for this patient.   Objective: Vital Signs: BP (!) 144/82 (BP Location: Left Arm, Patient Position: Sitting, Cuff Size: Normal)   Pulse 65   Resp 14   Ht 5' 2.75" (1.594 m)   Wt 169 lb 12.8 oz (77 kg)   BMI 30.32  kg/m    Physical Exam Vitals signs and nursing note reviewed.  Constitutional:      Appearance: She is well-developed.  HENT:     Head: Normocephalic and atraumatic.  Eyes:     Conjunctiva/sclera: Conjunctivae normal.  Neck:     Musculoskeletal: Normal range of motion.  Cardiovascular:     Rate and Rhythm: Normal rate and regular rhythm.     Heart sounds: Normal heart sounds.  Pulmonary:      Effort: Pulmonary effort is normal.     Breath sounds: Normal breath sounds.  Abdominal:     General: Bowel sounds are normal.     Palpations: Abdomen is soft.  Lymphadenopathy:     Cervical: No cervical adenopathy.  Skin:    General: Skin is warm and dry.     Capillary Refill: Capillary refill takes less than 2 seconds.  Neurological:     Mental Status: She is alert and oriented to person, place, and time.  Psychiatric:        Behavior: Behavior normal.      Musculoskeletal Exam: C-spine good range of motion.  Thoracic kyphosis noted.  No midline spinal tenderness.  No SI joint tenderness.  Shoulder joints have good range of motion with some discomfort in the right shoulder.  Elbow joints good range of motion with no tenderness or inflammation.  She has limited range of motion bilateral wrist joints.  She has synovial thickening and ulnar deviation of MCP joints.  Limited range of motion of the right hip joint.  Bilateral knee replacements have good range of motion with no warmth or effusion.  No tenderness or swelling of ankle joints noted on exam.  CDAI Exam: CDAI Score: 0.8  Patient Global: 4 mm; Provider Global: 4 mm Swollen: 0 ; Tender: 0  Joint Exam   No joint exam has been documented for this visit   There is currently no information documented on the homunculus. Go to the Rheumatology activity and complete the homunculus joint exam.  Investigation: No additional findings.  Imaging: No results found.  Recent Labs: Lab Results  Component Value Date   WBC 5.3 03/12/2019   HGB 12.8 03/12/2019   PLT 205 03/12/2019   NA 138 03/12/2019   K 3.5 03/12/2019   CL 100 03/12/2019   CO2 28 03/12/2019   GLUCOSE 81 03/12/2019   BUN 18 03/12/2019   CREATININE 1.21 (H) 03/12/2019   BILITOT 0.7 03/12/2019   ALKPHOS 48 03/25/2017   AST 15 03/12/2019   ALT 7 03/12/2019   PROT 7.8 03/12/2019   ALBUMIN 3.8 03/25/2017   CALCIUM 9.6 03/12/2019   GFRAA 49 (L) 03/12/2019    QFTBGOLDPLUS NEGATIVE 05/22/2018    Speciality Comments: TB Gold: 03/25/17 Neg  Procedures:  No procedures performed Allergies: Patient has no known allergies.           Assessment / Plan:     Visit Diagnoses: Seropositive rheumatoid arthritis of multiple sites ([place]) - +RF,+anti CCP:She has no synovitis on exam.  She has not had any recent rheumatoid arthritis flares.  She has intermittent discomfort in both wrist joints and both ankle joints.  She has not tenderness or inflammation of the wrist joints or ankle joints on exam.  She is on Enbrel 50 mg subcutaneous injections every week.  She held Enbrel for 2 months during the COVID-19 pandemic restarted in July.  She is due for her next Enbrel injection but requested a refill and a sample today in the  office.  She will continue on Enbrel as prescribed.  She is due to update CBC CMP, and TB gold.  Orders were placed today.  She is advised to notify us if she develops increased joint pain or joint swelling.  She will follow-up in the office in 3 months  High risk medication use - Enbrel Sureclick 50 mg every 7 days.  Last TB gold negative 05/22/2018.  Due for TB gold today and will monitor yearly.  Most recent CBC/CMP within normal limits except for elevated serum creatinine/decreased GFR on 03/12/2019.  Due for CBC/CMP today and will monitor every 3 months.  Subdeltoid bursitis of right shoulder joint: She has good ROM of the right shoulder with some discomfort.  She experiences some nocturnal pain when lying on her right side  Trapezius muscle spasm: She has no trapezius muscle tension and muscle tenderness at this time.  History of total knee replacement, bilateral: Doing well.  She has good ROM with no discomfort.  She has no warmth or effusion on exam.  She continues to have difficulty climbing steps and getting up from a seated position.    Age-related osteoporosis without current pathological fracture - DEXA on 02/01/2019 1/3 distal  radius BMD 0.493 with T score -3.3.  Thoracic kyphosis noted. She completed 2 years of Forteo 20 mcg on 06/09/2019.  Prior to being on Forteo she had 3 Reclast infusions.  At her last visit on 03/12/2019 we discussed transitioning her to Reclast IV infusions once she completed Forteo.  We reviewed the indications, contraindications, potential side effects of Reclast today.  All questions were addressed.  She will be scheduled for IV Reclast infusion once yearly.  She will continue taking a vitamin D supplement.  History of vitamin D deficiency: She is taking a vitamin D supplement  Other medical conditions are listed as follows:  History of gastroesophageal reflux (GERD)  History of hypertension  Orders: No orders of the defined types were placed in this encounter.  No orders of the defined types were placed in this encounter.   Face-to-face time spent with patient was 30 minutes. Greater than 50% of time was spent in counseling and coordination of care.  Follow-Up Instructions: Return in about 3 months (around 09/12/2019) for Rheumatoid arthritis, Osteoporosis.   Ofilia Neas, PA-C  Note - This record has been created using Dragon software.  Chart creation errors have been sought, but may not always  have been located. Such creation errors do not reflect on  the standard of medical care.

## 2019-06-07 ENCOUNTER — Telehealth: Payer: Self-pay | Admitting: Rheumatology

## 2019-06-07 NOTE — Telephone Encounter (Signed)
As she has not taken Enbrel for a long time she will have to inject it in the office and will be observed for 30 minutes for any reaction.  We will inject at the follow-up visit on the 20th.

## 2019-06-07 NOTE — Telephone Encounter (Signed)
Patient called requesting prescription refill of Enbrel.    Patient states she just finished prescription of Forteo and is asking if she needs to continue taking it.   Patient states she also needs to fill out the forms for next year with patient assistance for her Enbrel.    Patient requested a return call.

## 2019-06-07 NOTE — Telephone Encounter (Signed)
Patient advised per Dr. Estanislado Pandy we would refill and restart her on Enbrel at her appointment on 06/12/19.

## 2019-06-07 NOTE — Telephone Encounter (Signed)
Patient states she stopped taking Enbrel when COVID started. Patient states she "had some leftover" and was advised at her last appointment she would need to start taking it again. The last prescription we have sent was in September 2019.   Last Visit: 03/12/19 Next Visit: 06/12/19 Labs: 03/12/19 Creatinine continues to trend up and GFR is 49.  CBC WNL  TB Gold: 05/22/18 Neg   Okay to refill Enbrel or wait until appointment on 06/12/19?

## 2019-06-12 ENCOUNTER — Encounter: Payer: Self-pay | Admitting: Physician Assistant

## 2019-06-12 ENCOUNTER — Ambulatory Visit (INDEPENDENT_AMBULATORY_CARE_PROVIDER_SITE_OTHER): Payer: Medicare Other | Admitting: Physician Assistant

## 2019-06-12 ENCOUNTER — Other Ambulatory Visit: Payer: Self-pay

## 2019-06-12 ENCOUNTER — Telehealth: Payer: Self-pay | Admitting: Pharmacist

## 2019-06-12 VITALS — BP 144/82 | HR 65 | Resp 14 | Ht 62.75 in | Wt 169.8 lb

## 2019-06-12 DIAGNOSIS — M7551 Bursitis of right shoulder: Secondary | ICD-10-CM | POA: Diagnosis not present

## 2019-06-12 DIAGNOSIS — Z96653 Presence of artificial knee joint, bilateral: Secondary | ICD-10-CM

## 2019-06-12 DIAGNOSIS — Z8679 Personal history of other diseases of the circulatory system: Secondary | ICD-10-CM | POA: Diagnosis not present

## 2019-06-12 DIAGNOSIS — Z79899 Other long term (current) drug therapy: Secondary | ICD-10-CM | POA: Diagnosis not present

## 2019-06-12 DIAGNOSIS — Z8639 Personal history of other endocrine, nutritional and metabolic disease: Secondary | ICD-10-CM | POA: Diagnosis not present

## 2019-06-12 DIAGNOSIS — M810 Age-related osteoporosis without current pathological fracture: Secondary | ICD-10-CM | POA: Diagnosis not present

## 2019-06-12 DIAGNOSIS — M62838 Other muscle spasm: Secondary | ICD-10-CM

## 2019-06-12 DIAGNOSIS — Z8719 Personal history of other diseases of the digestive system: Secondary | ICD-10-CM | POA: Diagnosis not present

## 2019-06-12 DIAGNOSIS — M0579 Rheumatoid arthritis with rheumatoid factor of multiple sites without organ or systems involvement: Secondary | ICD-10-CM | POA: Diagnosis not present

## 2019-06-12 MED ORDER — ENBREL SURECLICK 50 MG/ML ~~LOC~~ SOAJ: 50.0000 mg | SUBCUTANEOUS | 0 refills | Status: DC

## 2019-06-12 NOTE — Telephone Encounter (Signed)
Patient given Enbrel PAP application while in office today.  She will return the form along with income documents on Friday.   Mariella Saa, PharmD, [place], [place] Clinical Specialty Pharmacist [phone]  06/12/2019 11:23 AM

## 2019-06-12 NOTE — Telephone Encounter (Signed)
Findings of benefits investigation for Reclast :  Insurance: Medicare B/ [employer]  Plans are ACTIVE  Medicare covers 80% of the infusion and no authorization is required, and the patient's AARP supplement would cover the 20% of the cost that was not paid for by Medicare as long as Medicare covered the medication. No precert is required. The Supplement also covers the patient's Medicare deductible.  11:52 AM Beatriz Chancellor, CPhT

## 2019-06-12 NOTE — Telephone Encounter (Signed)
Please start BIV for Reclast.  She has completed two years of treatment with Forteo.  She received Reclast previously in 2013, 2014, 2016, and 2017.  She is not a good candidate for oral bisphosphonate due to GERD.  She has medicare and medicare supplement.

## 2019-06-12 NOTE — Patient Instructions (Signed)
Please return on Friday for labwork needed for Reclast Infusion and drop of Enbrel patient assistance application.  We will call you with your lab results and the number to schedule your infusion.

## 2019-06-12 NOTE — Progress Notes (Signed)
Pharmacy Note  Subjective: Patient presents today to the [facility] to see Dr. Estanislado Pandy.  Patient seen by pharmacist for counseling on bisphosphonate therapy for osteoporosis.  She completed 2 year treatment with Forteo.  She was previously treated with Reclast in 2013, 2014, 2016, 2018.  Objective:  T-score: -3.3 at left distal radius with 5% improvement in BMD  Lab Results  Component Value Date   VD25OH 56 10/16/2018   CMP     Component Value Date/Time   NA 138 03/12/2019 1204   K 3.5 03/12/2019 1204   CL 100 03/12/2019 1204   CO2 28 03/12/2019 1204   GLUCOSE 81 03/12/2019 1204   BUN 18 03/12/2019 1204   CREATININE 1.21 (H) 03/12/2019 1204   CALCIUM 9.6 03/12/2019 1204   PROT 7.8 03/12/2019 1204   ALBUMIN 3.8 03/25/2017 1102   AST 15 03/12/2019 1204   ALT 7 03/12/2019 1204   ALKPHOS 48 03/25/2017 1102   BILITOT 0.7 03/12/2019 1204   GFRNONAA 43 (L) 03/12/2019 1204   GFRAA 49 (L) 03/12/2019 1204    Counseled patient that Relcast is an IV bisphosphonate that reduces bone turnover by inhibiting osteoclasts that chew up bone.  Counseled patient on purpose, proper use, and adverse effects of Reclast.    Reviewed importance of taking calcium and vitamin D with bisphosphonate therapy.  Provided patient with medication education material and answered all questions.  Reviewed adverse events of Reclast including risk of nausea & diarrhea, headache, and muscle & bone pain.  Reviewed rare adverse effect of osteonecrosis of the jaw and advised patient to alert her dentist that she is on Reclast prior to any major dental work.  Patient confirms she does not have any major dental work scheduled at this time.  Patient agrees to trial of Reclast at this time.  She has Medicare and Medicare supplement.  Reclast should be covered but she may still have to pay a facility fee.  Patient verbalized understanding.  All questions encouraged and answered.  Instructed patient to call with  any other questions or concerns.  Mariella Saa, PharmD, [place], [place] Clinical Specialty Pharmacist [phone]  06/12/2019 11:18 AM

## 2019-06-15 ENCOUNTER — Other Ambulatory Visit: Payer: Self-pay | Admitting: *Deleted

## 2019-06-15 DIAGNOSIS — Z79899 Other long term (current) drug therapy: Secondary | ICD-10-CM

## 2019-06-15 DIAGNOSIS — M810 Age-related osteoporosis without current pathological fracture: Secondary | ICD-10-CM | POA: Diagnosis not present

## 2019-06-17 LAB — COMPLETE METABOLIC PANEL WITH GFR
AG Ratio: 1 (calc) (ref 1.0–2.5)
ALT: 7 U/L (ref 6–29)
AST: 16 U/L (ref 10–35)
Albumin: 3.7 g/dL (ref 3.6–5.1)
Alkaline phosphatase (APISO): 55 U/L (ref 37–153)
BUN/Creatinine Ratio: 20 (calc) (ref 6–22)
BUN: 23 mg/dL (ref 7–25)
CO2: 26 mmol/L (ref 20–32)
Calcium: 9.4 mg/dL (ref 8.6–10.4)
Chloride: 101 mmol/L (ref 98–110)
Creat: 1.14 mg/dL — ABNORMAL HIGH (ref 0.60–0.93)
GFR, Est African American: 53 mL/min/{1.73_m2} — ABNORMAL LOW (ref >=60)
GFR, Est Non African American: 46 mL/min/{1.73_m2} — ABNORMAL LOW (ref >=60)
Globulin: 3.6 g/dL (calc) (ref 1.9–3.7)
Glucose, Bld: 86 mg/dL (ref 65–99)
Potassium: 3.9 mmol/L (ref 3.5–5.3)
Sodium: 137 mmol/L (ref 135–146)
Total Bilirubin: 0.5 mg/dL (ref 0.2–1.2)
Total Protein: 7.3 g/dL (ref 6.1–8.1)

## 2019-06-17 LAB — CBC WITH DIFFERENTIAL/PLATELET
Absolute Monocytes: 301 cells/uL (ref 200–950)
Basophils Absolute: 30 cells/uL (ref 0–200)
Basophils Relative: 0.7 %
Eosinophils Absolute: 443 cells/uL (ref 15–500)
Eosinophils Relative: 10.3 %
HCT: 35.8 % (ref 35.0–45.0)
Hemoglobin: 12 g/dL (ref 11.7–15.5)
Lymphs Abs: 1759 cells/uL (ref 850–3900)
MCH: 30.8 pg (ref 27.0–33.0)
MCHC: 33.5 g/dL (ref 32.0–36.0)
MCV: 92 fL (ref 80.0–100.0)
MPV: 9.5 fL (ref 7.5–12.5)
Monocytes Relative: 7 %
Neutro Abs: 1767 cells/uL (ref 1500–7800)
Neutrophils Relative %: 41.1 %
Platelets: 179 10*3/uL (ref 140–400)
RBC: 3.89 10*6/uL (ref 3.80–5.10)
RDW: 13 % (ref 11.0–15.0)
Total Lymphocyte: 40.9 %
WBC: 4.3 10*3/uL (ref 3.8–10.8)

## 2019-06-17 LAB — VITAMIN D 25 HYDROXY (VIT D DEFICIENCY, FRACTURES): Vit D, 25-Hydroxy: 51 ng/mL (ref 30–100)

## 2019-06-17 LAB — QUANTIFERON-TB GOLD PLUS
Mitogen-NIL: >10 IU/mL
NIL: 0.02 IU/mL
QuantiFERON-TB Gold Plus: NEGATIVE
TB1-NIL: 0 IU/mL
TB2-NIL: 0 IU/mL

## 2019-06-18 NOTE — Progress Notes (Signed)
Labs are stable.  Vitamin D is normal.

## 2019-06-18 NOTE — Telephone Encounter (Signed)
Patient returned renewal application unsigned application. Called patient, she would like to have the unsigned pages mailed to her to complete and will return.  9:55 AM Andrea [name], CPhT

## 2019-06-19 ENCOUNTER — Telehealth: Payer: Self-pay | Admitting: *Deleted

## 2019-06-19 ENCOUNTER — Other Ambulatory Visit: Payer: Self-pay | Admitting: *Deleted

## 2019-06-19 DIAGNOSIS — M810 Age-related osteoporosis without current pathological fracture: Secondary | ICD-10-CM

## 2019-06-19 NOTE — Telephone Encounter (Signed)
Infusion Orders placed and called patient to provide number for inufsuion center.

## 2019-06-19 NOTE — Telephone Encounter (Signed)
-----   Message from [employer], [facility] sent at 06/19/2019  8:47 AM EDT ----- Labs are normal and okay to start Reclast.  Please place Reclast orders and call patient with number to schedule infusion at [facility].  Thanks!

## 2019-06-22 DIAGNOSIS — K219 Gastro-esophageal reflux disease without esophagitis: Secondary | ICD-10-CM | POA: Diagnosis not present

## 2019-06-22 DIAGNOSIS — I129 Hypertensive chronic kidney disease with stage 1 through stage 4 chronic kidney disease, or unspecified chronic kidney disease: Secondary | ICD-10-CM | POA: Diagnosis not present

## 2019-06-22 DIAGNOSIS — E785 Hyperlipidemia, unspecified: Secondary | ICD-10-CM | POA: Diagnosis not present

## 2019-06-28 ENCOUNTER — Other Ambulatory Visit: Payer: Self-pay | Admitting: *Deleted

## 2019-06-28 NOTE — Progress Notes (Signed)
Infusion orders are current for patient  Tylenol Benadryl appointments are up to date.

## 2019-06-29 ENCOUNTER — Other Ambulatory Visit: Payer: Self-pay

## 2019-06-29 ENCOUNTER — Ambulatory Visit (HOSPITAL_COMMUNITY)
Admission: RE | Admit: 2019-06-29 | Discharge: 2019-06-29 | Disposition: A | Discharge status: Home / Self Care | Payer: Medicare Other | Source: Ambulatory Visit | Attending: Rheumatology | Admitting: Rheumatology

## 2019-06-29 DIAGNOSIS — M810 Age-related osteoporosis without current pathological fracture: Secondary | ICD-10-CM | POA: Diagnosis not present

## 2019-06-29 MED ORDER — DIPHENHYDRAMINE HCL 25 MG PO CAPS
ORAL_CAPSULE | ORAL | Status: AC
  Administered 2019-06-29: 25 mg via ORAL
  Filled 2019-06-29: qty 1

## 2019-06-29 MED ORDER — ZOLEDRONIC ACID 5 MG/100ML IV SOLN
5.0000 mg | Freq: Once | INTRAVENOUS | Status: AC
  Administered 2019-06-29: 11:00:00 5 mg via INTRAVENOUS

## 2019-06-29 MED ORDER — DIPHENHYDRAMINE HCL 25 MG PO CAPS
25.0000 mg | ORAL_CAPSULE | ORAL | Status: AC
  Administered 2019-06-29: 10:00:00 25 mg via ORAL

## 2019-06-29 MED ORDER — ACETAMINOPHEN 325 MG PO TABS
ORAL_TABLET | ORAL | Status: AC
  Administered 2019-06-29: 650 mg via ORAL
  Filled 2019-06-29: qty 2

## 2019-06-29 MED ORDER — ACETAMINOPHEN 325 MG PO TABS
650.0000 mg | ORAL_TABLET | ORAL | Status: AC
  Administered 2019-06-29: 10:00:00 650 mg via ORAL

## 2019-06-29 MED ORDER — ZOLEDRONIC ACID 5 MG/100ML IV SOLN
INTRAVENOUS | Status: AC
  Administered 2019-06-29: 5 mg via INTRAVENOUS
  Filled 2019-06-29: qty 100

## 2019-07-05 DIAGNOSIS — Z23 Encounter for immunization: Secondary | ICD-10-CM | POA: Diagnosis not present

## 2019-07-13 DIAGNOSIS — E785 Hyperlipidemia, unspecified: Secondary | ICD-10-CM | POA: Diagnosis not present

## 2019-07-13 DIAGNOSIS — R7303 Prediabetes: Secondary | ICD-10-CM | POA: Diagnosis not present

## 2019-07-13 DIAGNOSIS — I129 Hypertensive chronic kidney disease with stage 1 through stage 4 chronic kidney disease, or unspecified chronic kidney disease: Secondary | ICD-10-CM | POA: Diagnosis not present

## 2019-07-23 DIAGNOSIS — I129 Hypertensive chronic kidney disease with stage 1 through stage 4 chronic kidney disease, or unspecified chronic kidney disease: Secondary | ICD-10-CM | POA: Diagnosis not present

## 2019-07-23 DIAGNOSIS — E785 Hyperlipidemia, unspecified: Secondary | ICD-10-CM | POA: Diagnosis not present

## 2019-07-23 DIAGNOSIS — K219 Gastro-esophageal reflux disease without esophagitis: Secondary | ICD-10-CM | POA: Diagnosis not present

## 2019-07-24 ENCOUNTER — Telehealth: Payer: Self-pay | Admitting: Pharmacy Technician

## 2019-07-24 NOTE — Telephone Encounter (Signed)
Submitted a Prior Authorization request to [facility] for ENBREL via Cover My Meds. Will update once we receive a response.

## 2019-07-25 NOTE — Telephone Encounter (Signed)
Received notification from [employer] [facility] regarding a prior authorization for ENBREL. Authorization has been APPROVED from 07/25/2019 to 08/22/2020.   Will send document to scan center.  Fax sent to [employer] for renewal application approval.   Mariella Saa, PharmD, [place], [place] Specialty Pharmacist [phone]  07/25/2019 11:20 AM

## 2019-07-26 DIAGNOSIS — R7303 Prediabetes: Secondary | ICD-10-CM | POA: Diagnosis not present

## 2019-07-26 DIAGNOSIS — N183 Chronic kidney disease, stage 3 unspecified: Secondary | ICD-10-CM | POA: Diagnosis not present

## 2019-07-26 DIAGNOSIS — Z20828 Contact with and (suspected) exposure to other viral communicable diseases: Secondary | ICD-10-CM | POA: Diagnosis not present

## 2019-07-26 DIAGNOSIS — Z1211 Encounter for screening for malignant neoplasm of colon: Secondary | ICD-10-CM | POA: Diagnosis not present

## 2019-07-26 DIAGNOSIS — E785 Hyperlipidemia, unspecified: Secondary | ICD-10-CM | POA: Diagnosis not present

## 2019-07-26 DIAGNOSIS — I129 Hypertensive chronic kidney disease with stage 1 through stage 4 chronic kidney disease, or unspecified chronic kidney disease: Secondary | ICD-10-CM | POA: Diagnosis not present

## 2019-07-30 ENCOUNTER — Other Ambulatory Visit: Payer: Self-pay

## 2019-08-21 ENCOUNTER — Other Ambulatory Visit: Payer: Self-pay

## 2019-08-21 ENCOUNTER — Encounter: Payer: Self-pay | Admitting: Gastroenterology

## 2019-08-21 ENCOUNTER — Telehealth (INDEPENDENT_AMBULATORY_CARE_PROVIDER_SITE_OTHER): Payer: Medicare Other | Admitting: Gastroenterology

## 2019-08-21 VITALS — Ht 63.0 in | Wt 167.0 lb

## 2019-08-21 DIAGNOSIS — R195 Other fecal abnormalities: Secondary | ICD-10-CM | POA: Diagnosis not present

## 2019-08-21 DIAGNOSIS — K219 Gastro-esophageal reflux disease without esophagitis: Secondary | ICD-10-CM | POA: Diagnosis not present

## 2019-08-21 DIAGNOSIS — Z1159 Encounter for screening for other viral diseases: Secondary | ICD-10-CM

## 2019-08-21 NOTE — Patient Instructions (Addendum)
If you are age 80 or older, your body mass index should be between 23-30. Your Body mass index is 29.58 kg/m. If this is out of the aforementioned range listed, please consider follow up with your Primary Care Provider.  If you are age 46 or younger, your body mass index should be between 19-25. Your Body mass index is 29.58 kg/m. If this is out of the aformentioned range listed, please consider follow up with your Primary Care Provider.   You have been scheduled for an endoscopy and colonoscopy. Please follow the written instructions given to you at your visit today. Please pick up your prep supplies at the pharmacy within the next 1-3 days. If you use inhalers (even only as needed), please bring them with you on the day of your procedure. Your physician has requested that you go to www.startemmi.com and enter the access code given to you at your visit today. This web site gives a general overview about your procedure. However, you should still follow specific instructions given to you by our office regarding your preparation for the procedure.  Continue Omeprazole.   Due to recent COVID-19 restrictions implemented by [employer] and state authorities and in an effort to keep both patients and staff as safe as possible, [facility] requires COVID-19 testing prior to any scheduled endoscopic procedure. The testing center is located at [address] [place],  [address] in the [facility] [employer]  suite.  Your appointment has been scheduled for 09/05/19 at 10:10am.   Please bring your insurance cards to this appointment. You will require your COVID screen 2 business days prior to your endoscopic procedure.  You are not required to quarantine after your screening.  You will only receive a phone call with the results if it is POSITIVE.  If you do not receive a call the day before your procedure you should begin your prep, if ordered, and you  should report to the endo center for your procedure at your designated appointment arrival time ( one hour prior to the procedure time). There is no cost to you for the screening on the day of the swab.  [facility] will file with your insurance company for the testing.    You may receive an automated phone call prior to your procedure or have a message in your MyChart that you have an appointment for a BP/15 at the [facility], please disregard this message.  Your testing will be at the [place] [place] location.   If you are leaving [facility] travel [place] on [address], turn left onto [facility] [address] [facility] [address], turn night onto [address] at the 1st stop light turn right, pass the [employer] on your right and proceed to [place] (white building).   I have attached a Pre Procedure Patient Acknowledgement form and a prepaid envelope, please initial and sign form and mail back in envelope.     Thank you,  Dr. Jackquline Denmark

## 2019-08-21 NOTE — Progress Notes (Signed)
Chief Complaint:   Referring Provider:  Maryella Shivers, MD      ASSESSMENT AND PLAN;   #1. Heme positive stools  #2. GERD with hiatal hernia.  Having extra esophageal manifestations despite PPIs.  #3. Comorbid conditions include CKD 3, HTN, HLD, RA  Plan: -Proceed with EGD/colon with MiraLAX. Discussed risks & benefits. (Risks including rare perforation req laparotomy, bleeding after bx/polypectomy req blood transfusion, rarely missing neoplasms, risks of anesthesia/sedation). Benefits outweigh the risks. Patient agrees to proceed. All the questions were answered. Consent forms given for review. -Continue omeprazole 20 mg p.o. once a day for now. -Follow-up thereafter    HPI:    Andrea [name] is a 78 y.o. female   With heme positive stools.  Advised colonoscopy.  Hemoglobin was okay.  Mucus especially in the morning.  Denies having any significant hoarseness.  Has been clearing her throat after eating.  Also has been advised to get upper GI work-up.  No nausea, vomiting, heartburn, regurgitation, odynophagia or dysphagia.  No significant diarrhea or constipation.  No melena or hematochezia. No unintentional weight loss. No abdominal pain.  No significant nonsteroidals.   Past GI procedures: EGD 04/2013: 6 cm hiatal hernia, mild gastritis. Colonoscopy 12/2010 ([facility])-mild pancolonic diverticulosis.  Small internal hemorrhoids.  SH Lauren's mom.  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 Past Medical History:  Diagnosis Date  . Arthritis   . GERD (gastroesophageal reflux disease)   . History of colon polyps   . Hypercholesterolemia   . Hypertension   . Osteoporosis     Past Surgical History:  Procedure Laterality Date  . CARPAL TUNNEL RELEASE    . COLONOSCOPY  12/23/2010   Mild pancolonic diverticulosis. Small internal hemorroids  . ESOPHAGOGASTRODUODENOSCOPY  05/09/2013   Hiatal Hernia. Presbyesophagus. Minimal antral gastritis.  Marland Kitchen JOINT REPLACEMENT     BIL knee   . KNEE ARTHROPLASTY     Right knee 2012, Left knee total 2009  . TUBAL LIGATION Bilateral     Family History  Problem Relation Age of Onset  . Kidney disease Mother   . Cancer Father   . Stroke Brother   . Lung disease Brother   . Rheum arthritis Daughter   . Rheum arthritis Daughter   . Colon cancer Neg Hx   . Esophageal cancer Neg Hx     Social History   Tobacco Use  . Smoking status: Never Smoker  . Smokeless tobacco: Never Used  Substance Use Topics  . Alcohol use: No  . Drug use: No    Current Outpatient Medications  Medication Sig Dispense Refill  . Cholecalciferol (VITAMIN D3 SUPER STRENGTH) 50 MCG (2000 UT) CAPS Take 1 capsule by mouth daily.    . diclofenac sodium (VOLTAREN) 1 % GEL Voltaren Gel 3 grams to 3 large joints upto TID 3 TUBES with 3 refills (Patient taking differently: Apply topically as needed. Voltaren Gel 3 grams to 3 large joints upto TID 3 TUBES with 3 refills) 3 Tube 3  . doxazosin (CARDURA) 1 MG tablet Take 1 mg by mouth daily.     Marland Kitchen guaiFENesin (MUCINEX PO) Take by mouth daily.    Marland Kitchen loratadine (CLARITIN) 10 MG tablet Take 10 mg by mouth daily.    Marland Kitchen omeprazole (PRILOSEC) 20 MG capsule Take 20 mg by mouth daily.     . traZODone (DESYREL) 50 MG tablet Take 50 mg by mouth as needed.     . triamterene-hydrochlorothiazide (MAXZIDE-25) 37.5-25 MG tablet Take 1  tablet by mouth daily.     . zoledronic acid (RECLAST) 5 MG/100ML SOLN injection Inject 5 mg into the vein once. Once a year    . etanercept (ENBREL SURECLICK) 50 MG/ML injection Inject 0.98 mLs (50 mg total) into the skin once a week. (Patient not taking: Reported on 08/21/2019) 11.76 mL 0   No current facility-administered medications for this visit.    No Known Allergies  Review of Systems:  Constitutional: Denies fever, chills, diaphoresis, appetite change and fatigue.  HEENT: Denies photophobia, eye pain, redness, hearing loss, ear pain, congestion, sore throat, rhinorrhea,  sneezing, mouth sores, neck pain, neck stiffness and tinnitus.   Respiratory: Denies SOB, DOE, cough, chest tightness,  and wheezing.   Cardiovascular: Denies chest pain, palpitations and leg swelling.  Genitourinary: Denies dysuria, urgency, frequency, hematuria, flank pain and difficulty urinating.  Musculoskeletal: Has arthritis. Skin: No rash.  Neurological: Denies dizziness, seizures, syncope, weakness, light-headedness, numbness and headaches.  Hematological: Denies adenopathy. Easy bruising, personal or family bleeding history  Psychiatric/Behavioral: No anxiety or depression     Physical Exam:    Ht 5\' 3"  (1.6 m)   Wt 167 lb (75.8 kg)   BMI 29.58 kg/m  Wt Readings from Last 3 Encounters:  08/21/19 167 lb (75.8 kg)  06/29/19 168 lb (76.2 kg)  06/12/19 169 lb 12.8 oz (77 kg)   Constitutional:  Well-developed, in no acute distress. Psychiatric: Normal mood and affect. Behavior is normal.   Data Reviewed: I have personally reviewed following labs and imaging studies  CBC: CBC Latest Ref Rng & Units 06/15/2019 03/12/2019 10/16/2018  WBC 3.8 - 10.8 Thousand/uL 4.3 5.3 4.0  Hemoglobin 11.7 - 15.5 g/dL 12.0 12.8 12.2  Hematocrit 35.0 - 45.0 % 35.8 37.3 35.1  Platelets 140 - 400 Thousand/uL 179 205 202    CMP: CMP Latest Ref Rng & Units 06/15/2019 03/12/2019 10/16/2018  Glucose 65 - 99 mg/dL 86 81 80  BUN 7 - 25 mg/dL 23 18 20   Creatinine 0.60 - 0.93 mg/dL 1.14(H) 1.21(H) 1.07(H)  Sodium 135 - 146 mmol/L 137 138 139  Potassium 3.5 - 5.3 mmol/L 3.9 3.5 3.8  Chloride 98 - 110 mmol/L 101 100 101  CO2 20 - 32 mmol/L 26 28 31   Calcium 8.6 - 10.4 mg/dL 9.4 9.6 9.5  Total Protein 6.1 - 8.1 g/dL 7.3 7.8 7.4  Total Bilirubin 0.2 - 1.2 mg/dL 0.5 0.7 0.3  Alkaline Phos 33 - 130 U/L - - -  AST 10 - 35 U/L 16 15 16   ALT 6 - 29 U/L 7 7 7    I connected with  Serita Grit on 08/21/19 by a video enabled telemedicine application and verified that I am speaking with the correct person  using two identifiers.   I discussed the limitations of evaluation and management by telemedicine. The patient expressed understanding and agreed to proceed.  Time spent including coordination of care: 30 min    Carmell Austria, MD 08/21/2019, 2:48 PM  Cc: Maryella Shivers, MD

## 2019-08-24 HISTORY — PX: UPPER GI ENDOSCOPY: SHX6162

## 2019-08-24 HISTORY — PX: COLONOSCOPY: SHX174

## 2019-08-27 ENCOUNTER — Telehealth: Payer: Self-pay | Admitting: Pharmacist

## 2019-08-27 NOTE — Telephone Encounter (Signed)
Received a fax from  [facility] regarding an approval for ENBREL from 08/24/19 to 08/22/2020.   Will send document to scan center.   Mariella Saa, PharmD, [place], [place] Clinical Specialty Pharmacist [phone]  08/27/2019 2:39 PM

## 2019-08-27 NOTE — Telephone Encounter (Signed)
Received a fax from  [facility] regarding an approval for ENBREL from 08/24/19 to 08/22/2020.   Will send document to scan center.   Mariella Saa, PharmD, [place], [place] Clinical Specialty Pharmacist [phone]  08/27/2019 2:39 PM

## 2019-09-05 ENCOUNTER — Other Ambulatory Visit: Payer: Self-pay | Admitting: Gastroenterology

## 2019-09-05 ENCOUNTER — Ambulatory Visit (INDEPENDENT_AMBULATORY_CARE_PROVIDER_SITE_OTHER): Payer: Medicare Other

## 2019-09-05 DIAGNOSIS — Z1159 Encounter for screening for other viral diseases: Secondary | ICD-10-CM | POA: Diagnosis not present

## 2019-09-05 LAB — SARS CORONAVIRUS 2 (TAT 6-24 HRS): SARS Coronavirus 2: NEGATIVE

## 2019-09-07 ENCOUNTER — Other Ambulatory Visit: Payer: Self-pay

## 2019-09-07 ENCOUNTER — Encounter: Payer: Self-pay | Admitting: Gastroenterology

## 2019-09-07 ENCOUNTER — Ambulatory Visit (AMBULATORY_SURGERY_CENTER): Payer: Medicare Other | Admitting: Gastroenterology

## 2019-09-07 VITALS — BP 121/66 | HR 64 | Temp 97.9°F | Resp 45 | Ht 63.0 in | Wt 167.0 lb

## 2019-09-07 DIAGNOSIS — K2970 Gastritis, unspecified, without bleeding: Secondary | ICD-10-CM

## 2019-09-07 DIAGNOSIS — R195 Other fecal abnormalities: Secondary | ICD-10-CM | POA: Diagnosis not present

## 2019-09-07 DIAGNOSIS — K648 Other hemorrhoids: Secondary | ICD-10-CM

## 2019-09-07 DIAGNOSIS — K5730 Diverticulosis of large intestine without perforation or abscess without bleeding: Secondary | ICD-10-CM

## 2019-09-07 DIAGNOSIS — D125 Benign neoplasm of sigmoid colon: Secondary | ICD-10-CM

## 2019-09-07 DIAGNOSIS — D123 Benign neoplasm of transverse colon: Secondary | ICD-10-CM | POA: Diagnosis not present

## 2019-09-07 DIAGNOSIS — K219 Gastro-esophageal reflux disease without esophagitis: Secondary | ICD-10-CM

## 2019-09-07 DIAGNOSIS — K2951 Unspecified chronic gastritis with bleeding: Secondary | ICD-10-CM | POA: Diagnosis not present

## 2019-09-07 DIAGNOSIS — K3189 Other diseases of stomach and duodenum: Secondary | ICD-10-CM | POA: Diagnosis not present

## 2019-09-07 MED ORDER — SODIUM CHLORIDE 0.9 % IV SOLN: 500.0000 mL | Freq: Once | INTRAVENOUS | Status: DC

## 2019-09-07 NOTE — Progress Notes (Signed)
Called to room to assist during endoscopic procedure.  Patient ID and intended procedure confirmed with present staff. Received instructions for my participation in the procedure from the performing physician.  

## 2019-09-07 NOTE — Progress Notes (Signed)
PT taken to PACU. Monitors in place. VSS. Report given to RN. 

## 2019-09-07 NOTE — Op Note (Signed)
[facility] Patient Name: Andrea [name] Procedure Date: 09/07/2019 1:29 PM MRN: [id number] Endoscopist: Jackquline Denmark , MD Age: 79 Referring MD:  Date of Birth: Dec 17, 1940 Gender: Female Account #: [id number] Procedure:                Colonoscopy Indications:              Heme positive stool Medicines:                Monitored Anesthesia Care Procedure:                Pre-Anesthesia Assessment:                           - Prior to the procedure, a History and Physical                            was performed, and patient medications and                            allergies were reviewed. The patient's tolerance of                            previous anesthesia was also reviewed. The risks                            and benefits of the procedure and the sedation                            options and risks were discussed with the patient.                            All questions were answered, and informed consent                            was obtained. Prior Anticoagulants: The patient has                            taken no previous anticoagulant or antiplatelet                            agents. ASA Grade Assessment: II - A patient with                            mild systemic disease. After reviewing the risks                            and benefits, the patient was deemed in                            satisfactory condition to undergo the procedure.                           After obtaining informed consent, the colonoscope  was passed under direct vision. Throughout the                            procedure, the patient's blood pressure, pulse, and                            oxygen saturations were monitored continuously. The                            Colonoscope was introduced through the anus and                            advanced to the the cecum, identified by                            appendiceal orifice and ileocecal valve. The                      colonoscopy was performed without difficulty. The                            patient tolerated the procedure well. The quality                            of the bowel preparation was good. The ileocecal                            valve, appendiceal orifice, and rectum were                            photographed. Scope In: 1:40:10 PM Scope Out: 1:58:43 PM Scope Withdrawal Time: 0 hours 13 minutes 27 seconds  Total Procedure Duration: 0 hours 18 minutes 33 seconds  Findings:                 A 2 mm polyp was found in the proximal transverse                            colon. The polyp was sessile. The polyp was removed                            with a cold biopsy forceps. Resection and retrieval                            were complete.                           Three sessile polyps were found in the sigmoid                            colon and transverse colon. The polyps were 6 to 8                            mm in size. These polyps were removed with a cold  snare. Resection and retrieval were complete.                           A few small-mouthed diverticula were found in the                            sigmoid colon, transverse colon and ascending colon.                           Non-bleeding internal hemorrhoids were found during                            retroflexion. The hemorrhoids were moderate.                           The exam was otherwise without abnormality. Complications:            No immediate complications. Estimated Blood Loss:     Estimated blood loss: none. Impression:               -Small colonic polyps s/p polypectomy.                           -Colonic diverticulosis predominantly in the                            sigmoid colon.                           -Non-bleeding internal hemorrhoids.                           -Otherwise normal colonoscopy. Recommendation:           - Patient has a contact number available for                             emergencies. The signs and symptoms of potential                            delayed complications were discussed with the                            patient. Return to normal activities tomorrow.                            Written discharge instructions were provided to the                            patient.                           - Resume previous diet.                           - Continue present medications.                           -  Await pathology results.                           - Repeat colonoscopy is not recommended for                            surveillance based on clinical status at that time.                           - Return to GI clinic in 12 weeks.                           - The findings and recommendations were discussed                            with the patient's daughter Ander Purpura. Jackquline Denmark, MD 09/07/2019 2:05:57 PM This report has been signed electronically.

## 2019-09-07 NOTE — Op Note (Signed)
[facility] Patient Name: Andrea [name] Procedure Date: 09/07/2019 1:29 PM MRN: [id number] Endoscopist: Jackquline Denmark , MD Age: 79 Referring MD:  Date of Birth: 10/17/1940 Gender: Female Account #: [id number] Procedure:                Upper GI endoscopy Indications:              GERD with HH despite PPIs, morning cough and a lot                            of mucus. Heme positive stools r/o upper GI lesions. Medicines:                Monitored Anesthesia Care Procedure:                Pre-Anesthesia Assessment:                           - Prior to the procedure, a History and Physical                            was performed, and patient medications and                            allergies were reviewed. The patient's tolerance of                            previous anesthesia was also reviewed. The risks                            and benefits of the procedure and the sedation                            options and risks were discussed with the patient.                            All questions were answered, and informed consent                            was obtained. Prior Anticoagulants: The patient has                            taken no previous anticoagulant or antiplatelet                            agents. ASA Grade Assessment: II - A patient with                            mild systemic disease. After reviewing the risks                            and benefits, the patient was deemed in                            satisfactory condition to undergo the procedure.  After obtaining informed consent, the endoscope was                            passed under direct vision. Throughout the                            procedure, the patient's blood pressure, pulse, and                            oxygen saturations were monitored continuously. The                            Endoscope was introduced through the mouth, and   advanced to the second part of duodenum. The upper                            GI endoscopy was accomplished without difficulty.                            The patient tolerated the procedure well. Scope In: Scope Out: Findings:                 The esophagus was foreshortened with Z-line at 30                            cm from incisors.                           A 6 cm hiatal hernia was present. No [place]                            erosions. There was thick mucus.                           Localized mild inflammation characterized by                            erythema was found in the gastric antrum. Biopsies                            were taken with a cold forceps for histology.                           The examined duodenum was normal. Biopsies for                            histology were taken with a cold forceps for                            evaluation of celiac disease. Complications:            No immediate complications. Estimated Blood Loss:     Estimated blood loss: none. Impression:               - Large hiatal hernia.                           -  Mild gastritis. Recommendation:           - Patient has a contact number available for                            emergencies. The signs and symptoms of potential                            delayed complications were discussed with the                            patient. Return to normal activities tomorrow.                            Written discharge instructions were provided to the                            patient.                           - Resume previous diet.                           - Continue omeprazole 20 mg p.o. every morning                           - Add Pepcid 20 mg p.o. nightly.                           - Nonpharmacologic means of reflux control.                           - Await pathology results.                           - No aspirin, ibuprofen, naproxen, or other                             non-steroidal anti-inflammatory drugs.                           - The findings and recommendations were discussed                            with Lauren.                           - If still with problems especially with morning                            mucus and clearing of throat, recommend ENT                            consultation. Jackquline Denmark, MD 09/07/2019 2:14:34 PM This report has been signed electronically.

## 2019-09-07 NOTE — Patient Instructions (Signed)
HANDOUTS PROVIDED ON: HIATAL HERNIA, GASTRITIS, POLYPS, DIVERTICULOSIS, & HEMORRHOIDS  The polyps removed/biopsies taken today have been sent for pathology.  The results can take 1-3 weeks to receive.    You may resume your previous diet and medication schedule.  Continue taking the 20 mg of Omeprazole (Prilosec) every morning and start taking 20 mg of Famotidine (Pepcid) every night.  No aspirin, ibuprofen, naproxen, or other NSAIDs.  Thank you for allowing [place] to care for you today!!!  YOU HAD AN ENDOSCOPIC PROCEDURE TODAY AT [facility]:   Refer to the procedure report that was given to you for any specific questions about what was found during the examination.  If the procedure report does not answer your questions, please call your gastroenterologist to clarify.  If you requested that your care partner not be given the details of your procedure findings, then the procedure report has been included in a sealed envelope for you to review at your convenience later.  YOU SHOULD EXPECT: Some feelings of bloating in the abdomen. Passage of more gas than usual.  Walking can help get rid of the air that was put into your GI tract during the procedure and reduce the bloating. If you had a lower endoscopy (such as a colonoscopy or flexible sigmoidoscopy) you may notice spotting of blood in your stool or on the toilet paper. If you underwent a bowel prep for your procedure, you may not have a normal bowel movement for a few days.  Please Note:  You might notice some irritation and congestion in your nose or some drainage.  This is from the oxygen used during your procedure.  There is no need for concern and it should clear up in a day or so.  SYMPTOMS TO REPORT IMMEDIATELY:   Following lower endoscopy (colonoscopy or flexible sigmoidoscopy):  Excessive amounts of blood in the stool  Significant tenderness or worsening of abdominal pains  Swelling of the abdomen that is new,  acute  Fever of 100F or higher   Following upper endoscopy (EGD)  Vomiting of blood or coffee ground material  New chest pain or pain under the shoulder blades  Painful or persistently difficult swallowing  New shortness of breath  Fever of 100F or higher  Black, tarry-looking stools  For urgent or emergent issues, a gastroenterologist can be reached at any hour by calling [phone].   DIET:  We do recommend a small meal at first, but then you may proceed to your regular diet.  Drink plenty of fluids but you should avoid alcoholic beverages for 24 hours.  ACTIVITY:  You should plan to take it easy for the rest of today and you should NOT DRIVE or use heavy machinery until tomorrow (because of the sedation medicines used during the test).    FOLLOW UP: Our staff will call the number listed on your records 48-72 hours following your procedure to check on you and address any questions or concerns that you may have regarding the information given to you following your procedure. If we do not reach you, we will leave a message.  We will attempt to reach you two times.  During this call, we will ask if you have developed any symptoms of COVID 19. If you develop any symptoms (ie: fever, flu-like symptoms, shortness of breath, cough etc.) before then, please call [phone].  If you test positive for Covid 19 in the 2 weeks post procedure, please call and report this information to [place].  If any biopsies were taken you will be contacted by phone or by letter within the next 1-3 weeks.  Please call us at [phone] if you have not heard about the biopsies in 3 weeks.    SIGNATURES/CONFIDENTIALITY: You and/or your care partner have signed paperwork which will be entered into your electronic medical record.  These signatures attest to the fact that that the information above on your After Visit Summary has been reviewed and is understood.  Full responsibility of the confidentiality of  this discharge information lies with you and/or your care-partner.

## 2019-09-10 NOTE — Progress Notes (Signed)
Virtual Visit via Telephone Note  I connected with Andrea [name] on 09/14/19 at 10:30 AM EST by telephone and verified that I am speaking with the correct person using two identifiers.  Location: Patient: Home Provider: Clinic  This service was conducted via virtual visit.   The patient was located at home. I was located in my office.  Consent was obtained prior to the virtual visit and is aware of possible charges through their insurance for this visit.  The patient is an established patient.  Dr. Estanislado Pandy, MD conducted the virtual visit and Hazel Sams, PA-C acted as scribe during the service.  Office staff helped with scheduling follow up visits after the service was conducted.     I discussed the limitations, risks, security and privacy concerns of performing an evaluation and management service by telephone and the availability of in person appointments. I also discussed with the patient that there may be a patient responsible charge related to this service. The patient expressed understanding and agreed to proceed.  CC: Medication monitoring  History of Present Illness: Andrea [name] is a 79 y.o. female with history of seropositive rheumatoid arthritis and osteoporosis.  She is on Enbrel 50 mg sq weekly injections.  She was off of enbrel for 3 months due to insurance not approving Enbrel but she resumed 2 weeks ago. She denies any rheumatoid arthritis flares while off of Enbrel. She experiences arthralgias but no joint inflammation.  She denies any morning stiffness.   Review of Systems  Constitutional: Negative for fever and malaise/fatigue.  HENT: Negative for ear pain.   Eyes: Negative for photophobia, pain, discharge and redness.  Respiratory: Positive for cough. Negative for shortness of breath and wheezing.   Cardiovascular: Negative for chest pain and palpitations.  Gastrointestinal: Positive for blood in stool. Negative for constipation and diarrhea.       Currently seeing GI   Genitourinary: Negative for dysuria and urgency.  Musculoskeletal: Negative for back pain, joint pain, myalgias and neck pain.  Skin: Negative for rash.  Neurological: Negative for dizziness, weakness and headaches.  Endo/Heme/Allergies: Does not bruise/bleed easily.  Psychiatric/Behavioral: Negative for depression. The patient is not nervous/anxious and does not have insomnia.      Observations/Objective:  Physical Exam  Constitutional: She is oriented to person, place, and time.  Neurological: She is alert and oriented to person, place, and time.  Psychiatric: Mood, memory, affect and judgment normal.     Patient reports morning stiffness for 0 minutes.   Patient reports nocturnal pain.  Difficulty dressing/grooming: Denies Difficulty climbing stairs: Reports Difficulty getting out of chair: Reports Difficulty using hands for taps, buttons, cutlery, and/or writing: Denies  Assessment and Plan: Visit Diagnoses: Seropositive rheumatoid arthritis of multiple sites ([place]) - +RF,+anti CCP: She has not had any signs or symptoms of a rheumatoid arthritis flare.  She has occasional arthralgias but no joint inflammation.  She has not had any morning stiffness recently. She was off of Enbrel for 3 months (Oct, Nov, and Dec.) due to insurance approval.  She did not have any rheumatoid arthritis flares while off of Enbrel. She has resumed Enbrel 50 mg sq injections once weekly 2 weeks ago.  She will continue on this current regimen.  She was advised to notify us if she develops increased joint pain or joint swelling. She will follow up in 3-4 months.   High risk medication use - Enbrel Sureclick 50 mg sq every 7 days.  Last TB gold negative 06/15/2019.  CBC was drawn on 06/15/19 and CMP was drawn on 07/13/19.  TB gold negative on 06/15/19. She is due to update lab work.  Standing orders were placed today.   Subdeltoid bursitis of right shoulder joint: She has no discomfort at this time.    Trapezius muscle spasm: Resolved   History of total knee replacement, bilateral: Doing well.  She has some difficulty climbing steps and getting up from a chair.   Age-related osteoporosis without current pathological fracture - DEXA on 02/01/2019 1/3 distal radius BMD 0.493 with T score -3.3.  Thoracic kyphosis noted. She completed 2 years of Forteo 20 mcg on 06/09/2019.  Prior to being on Forteo she had 3 Reclast infusions. She has resumed IV Reclast infusion once yearly-last infusion in December 2020 per patient.  She is taking a vitamin D supplement.   History of vitamin D deficiency: She is taking a vitamin D supplement.   Other medical conditions are listed as follows:  History of gastroesophageal reflux (GERD)  History of hypertension  Follow Up Instructions: She will follow up in 3-4 months.    I discussed the assessment and treatment plan with the patient. The patient was provided an opportunity to ask questions and all were answered. The patient agreed with the plan and demonstrated an understanding of the instructions.   The patient was advised to call back or seek an in-person evaluation if the symptoms worsen or if the condition fails to improve as anticipated.  I provided 15 minutes of non-face-to-face time during this encounter. Bo Merino, MD   Scribed by-  Hazel Sams, PA-C

## 2019-09-11 ENCOUNTER — Telehealth: Payer: Self-pay

## 2019-09-11 NOTE — Telephone Encounter (Signed)
  Follow up Call-  Call back number 09/07/2019  Post procedure Call Back phone  # [phone]  Permission to leave phone message Yes  Some recent data might be hidden     Patient questions:  Do you have a fever, pain , or abdominal swelling? No. Pain Score  0 *  Have you tolerated food without any problems? Yes.    Have you been able to return to your normal activities? Yes.    Do you have any questions about your discharge instructions: Diet   No. Medications  No. Follow up visit  No.  Do you have questions or concerns about your Care? No.  Actions: * If pain score is 4 or above: No action needed, pain <4.  1. Have you developed a fever since your procedure? no  2.   Have you had an respiratory symptoms (SOB or cough) since your procedure? no  3.   Have you tested positive for COVID 19 since your procedure no  4.   Have you had any family members/close contacts diagnosed with the COVID 19 since your procedure?  no   If yes to any of these questions please route to Joylene John, RN and Alphonsa Gin, Therapist, sports.

## 2019-09-11 NOTE — Telephone Encounter (Signed)
No answer, left message to call back later today, B.Paton Crum RN. 

## 2019-09-13 ENCOUNTER — Encounter: Payer: Self-pay | Admitting: Gastroenterology

## 2019-09-14 ENCOUNTER — Other Ambulatory Visit: Payer: Self-pay

## 2019-09-14 ENCOUNTER — Telehealth (INDEPENDENT_AMBULATORY_CARE_PROVIDER_SITE_OTHER): Payer: Medicare Other | Admitting: Rheumatology

## 2019-09-14 ENCOUNTER — Encounter: Payer: Self-pay | Admitting: Rheumatology

## 2019-09-14 DIAGNOSIS — Z96653 Presence of artificial knee joint, bilateral: Secondary | ICD-10-CM

## 2019-09-14 DIAGNOSIS — M7551 Bursitis of right shoulder: Secondary | ICD-10-CM

## 2019-09-14 DIAGNOSIS — Z8639 Personal history of other endocrine, nutritional and metabolic disease: Secondary | ICD-10-CM | POA: Diagnosis not present

## 2019-09-14 DIAGNOSIS — Z8679 Personal history of other diseases of the circulatory system: Secondary | ICD-10-CM

## 2019-09-14 DIAGNOSIS — Z8719 Personal history of other diseases of the digestive system: Secondary | ICD-10-CM

## 2019-09-14 DIAGNOSIS — M0579 Rheumatoid arthritis with rheumatoid factor of multiple sites without organ or systems involvement: Secondary | ICD-10-CM | POA: Diagnosis not present

## 2019-09-14 DIAGNOSIS — M62838 Other muscle spasm: Secondary | ICD-10-CM | POA: Diagnosis not present

## 2019-09-14 DIAGNOSIS — Z79899 Other long term (current) drug therapy: Secondary | ICD-10-CM | POA: Diagnosis not present

## 2019-09-14 DIAGNOSIS — M810 Age-related osteoporosis without current pathological fracture: Secondary | ICD-10-CM

## 2019-09-18 ENCOUNTER — Other Ambulatory Visit: Payer: Self-pay | Admitting: *Deleted

## 2019-09-18 DIAGNOSIS — Z79899 Other long term (current) drug therapy: Secondary | ICD-10-CM

## 2019-09-19 LAB — COMPLETE METABOLIC PANEL WITH GFR
AG Ratio: 1 (calc) (ref 1.0–2.5)
ALT: 7 U/L (ref 6–29)
AST: 14 U/L (ref 10–35)
Albumin: 3.8 g/dL (ref 3.6–5.1)
Alkaline phosphatase (APISO): 67 U/L (ref 37–153)
BUN/Creatinine Ratio: 18 (calc) (ref 6–22)
BUN: 21 mg/dL (ref 7–25)
CO2: 30 mmol/L (ref 20–32)
Calcium: 9.4 mg/dL (ref 8.6–10.4)
Chloride: 98 mmol/L (ref 98–110)
Creat: 1.18 mg/dL — ABNORMAL HIGH (ref 0.60–0.88)
GFR, Est African American: 50 mL/min/{1.73_m2} — ABNORMAL LOW (ref >=60)
GFR, Est Non African American: 44 mL/min/{1.73_m2} — ABNORMAL LOW (ref >=60)
Globulin: 3.8 g/dL (calc) — ABNORMAL HIGH (ref 1.9–3.7)
Glucose, Bld: 94 mg/dL (ref 65–99)
Potassium: 3.6 mmol/L (ref 3.5–5.3)
Sodium: 138 mmol/L (ref 135–146)
Total Bilirubin: 0.3 mg/dL (ref 0.2–1.2)
Total Protein: 7.6 g/dL (ref 6.1–8.1)

## 2019-09-19 LAB — CBC WITH DIFFERENTIAL/PLATELET
Absolute Monocytes: 343 cells/uL (ref 200–950)
Basophils Absolute: 28 cells/uL (ref 0–200)
Basophils Relative: 0.6 %
Eosinophils Absolute: 244 cells/uL (ref 15–500)
Eosinophils Relative: 5.2 %
HCT: 37.2 % (ref 35.0–45.0)
Hemoglobin: 12.4 g/dL (ref 11.7–15.5)
Lymphs Abs: 2096 cells/uL (ref 850–3900)
MCH: 30.6 pg (ref 27.0–33.0)
MCHC: 33.3 g/dL (ref 32.0–36.0)
MCV: 91.9 fL (ref 80.0–100.0)
MPV: 9.1 fL (ref 7.5–12.5)
Monocytes Relative: 7.3 %
Neutro Abs: 1988 cells/uL (ref 1500–7800)
Neutrophils Relative %: 42.3 %
Platelets: 308 10*3/uL (ref 140–400)
RBC: 4.05 10*6/uL (ref 3.80–5.10)
RDW: 11.6 % (ref 11.0–15.0)
Total Lymphocyte: 44.6 %
WBC: 4.7 10*3/uL (ref 3.8–10.8)

## 2019-09-20 NOTE — Progress Notes (Signed)
Creatinine is elevated but stable. GFR is low at 44.  She is on HCTZ.  Please forward labs to PCP.  CBC WNL.

## 2019-09-21 DIAGNOSIS — N183 Chronic kidney disease, stage 3 unspecified: Secondary | ICD-10-CM | POA: Diagnosis not present

## 2019-09-21 DIAGNOSIS — R7303 Prediabetes: Secondary | ICD-10-CM | POA: Diagnosis not present

## 2019-09-21 DIAGNOSIS — E785 Hyperlipidemia, unspecified: Secondary | ICD-10-CM | POA: Diagnosis not present

## 2019-09-21 DIAGNOSIS — I129 Hypertensive chronic kidney disease with stage 1 through stage 4 chronic kidney disease, or unspecified chronic kidney disease: Secondary | ICD-10-CM | POA: Diagnosis not present

## 2019-10-21 DIAGNOSIS — N183 Chronic kidney disease, stage 3 unspecified: Secondary | ICD-10-CM | POA: Diagnosis not present

## 2019-10-21 DIAGNOSIS — I129 Hypertensive chronic kidney disease with stage 1 through stage 4 chronic kidney disease, or unspecified chronic kidney disease: Secondary | ICD-10-CM | POA: Diagnosis not present

## 2019-10-21 DIAGNOSIS — E785 Hyperlipidemia, unspecified: Secondary | ICD-10-CM | POA: Diagnosis not present

## 2019-10-21 DIAGNOSIS — R7303 Prediabetes: Secondary | ICD-10-CM | POA: Diagnosis not present

## 2019-11-02 DIAGNOSIS — Z961 Presence of intraocular lens: Secondary | ICD-10-CM | POA: Diagnosis not present

## 2019-11-13 DIAGNOSIS — E785 Hyperlipidemia, unspecified: Secondary | ICD-10-CM | POA: Diagnosis not present

## 2019-11-13 DIAGNOSIS — R7303 Prediabetes: Secondary | ICD-10-CM | POA: Diagnosis not present

## 2019-11-21 DIAGNOSIS — I129 Hypertensive chronic kidney disease with stage 1 through stage 4 chronic kidney disease, or unspecified chronic kidney disease: Secondary | ICD-10-CM | POA: Diagnosis not present

## 2019-11-21 DIAGNOSIS — N183 Chronic kidney disease, stage 3 unspecified: Secondary | ICD-10-CM | POA: Diagnosis not present

## 2019-11-21 DIAGNOSIS — E785 Hyperlipidemia, unspecified: Secondary | ICD-10-CM | POA: Diagnosis not present

## 2019-11-21 DIAGNOSIS — R7303 Prediabetes: Secondary | ICD-10-CM | POA: Diagnosis not present

## 2019-11-26 DIAGNOSIS — N183 Chronic kidney disease, stage 3 unspecified: Secondary | ICD-10-CM | POA: Diagnosis not present

## 2019-11-26 DIAGNOSIS — E785 Hyperlipidemia, unspecified: Secondary | ICD-10-CM | POA: Diagnosis not present

## 2019-11-26 DIAGNOSIS — R7303 Prediabetes: Secondary | ICD-10-CM | POA: Diagnosis not present

## 2019-11-26 DIAGNOSIS — M069 Rheumatoid arthritis, unspecified: Secondary | ICD-10-CM | POA: Diagnosis not present

## 2019-11-28 DIAGNOSIS — N183 Chronic kidney disease, stage 3 unspecified: Secondary | ICD-10-CM | POA: Diagnosis not present

## 2019-11-28 DIAGNOSIS — I129 Hypertensive chronic kidney disease with stage 1 through stage 4 chronic kidney disease, or unspecified chronic kidney disease: Secondary | ICD-10-CM | POA: Diagnosis not present

## 2019-12-21 DIAGNOSIS — R7303 Prediabetes: Secondary | ICD-10-CM | POA: Diagnosis not present

## 2019-12-21 DIAGNOSIS — N183 Chronic kidney disease, stage 3 unspecified: Secondary | ICD-10-CM | POA: Diagnosis not present

## 2019-12-21 DIAGNOSIS — I129 Hypertensive chronic kidney disease with stage 1 through stage 4 chronic kidney disease, or unspecified chronic kidney disease: Secondary | ICD-10-CM | POA: Diagnosis not present

## 2019-12-21 DIAGNOSIS — M069 Rheumatoid arthritis, unspecified: Secondary | ICD-10-CM | POA: Diagnosis not present

## 2020-01-04 NOTE — Progress Notes (Signed)
Office Visit Note  Patient: Andrea [name]             Date of Birth: 02/05/1941           MRN: [id number]             PCP: Marco Collie, MD Referring: Marco Collie, MD Visit Date: 01/16/2020 Occupation: @GUAROCC @  Subjective:  Right shoulder joint pain   History of Present Illness: HERAN STROHECKER is a 79 y.o. female with history of seropositive rheumatoid arthritis.  Patient is on Enbrel 50 mg subcutaneous injections once weekly.  She has missed 1 dose of Enbrel due to needing a refill.  She denies any recent rheumatoid arthritis flares.  She continues to have chronic pain in the right shoulder and bilateral knee replacements.  She experiences nocturnal pain in her right shoulder but has had good range of motion.  She states that she has not seen Dr. Ronnie Derby recently.  She denies any other joint pain or joint swelling at this time.    Activities of Daily Living:  Patient reports morning stiffness for several minutes.   Patient Reports nocturnal pain.  Difficulty dressing/grooming: Denies Difficulty climbing stairs: Reports Difficulty getting out of chair: Reports Difficulty using hands for taps, buttons, cutlery, and/or writing: Reports  Review of Systems  Constitutional: Positive for fatigue.  HENT: Negative for mouth sores, mouth dryness and nose dryness.   Eyes: Negative for pain, itching, visual disturbance and dryness.  Respiratory: Negative for cough, hemoptysis, shortness of breath and difficulty breathing.   Cardiovascular: Positive for swelling in legs/feet. Negative for chest pain, palpitations and hypertension.  Gastrointestinal: Negative for blood in stool, constipation and diarrhea.  Endocrine: Negative for increased urination.  Genitourinary: Negative for difficulty urinating and painful urination.  Musculoskeletal: Positive for arthralgias, joint pain and morning stiffness. Negative for joint swelling, myalgias, muscle weakness, muscle tenderness and myalgias.    Skin: Negative for color change, pallor, rash, hair loss, nodules/bumps, redness, skin tightness, ulcers and sensitivity to sunlight.  Allergic/Immunologic: Negative for susceptible to infections.  Neurological: Negative for dizziness, numbness, headaches, memory loss and weakness.  Hematological: Negative for bruising/bleeding tendency and swollen glands.  Psychiatric/Behavioral: Positive for sleep disturbance. Negative for depressed mood and confusion. The patient is not nervous/anxious.     PMFS History:  Patient Active Problem List   Diagnosis Date Noted  . History of hypertension 02/08/2017  . History of gastroesophageal reflux (GERD) 02/08/2017  . High risk medication use 12/27/2016  . Vitamin D deficiency 12/27/2016  . History of total knee replacement, bilateral 08/27/2016  . Age-related osteoporosis without current pathological fracture 08/27/2016  . Seropositive rheumatoid arthritis of multiple sites ([place]) 06/21/2016    Past Medical History:  Diagnosis Date  . Arthritis   . Cataract   . GERD (gastroesophageal reflux disease)   . History of colon polyps   . Hypercholesterolemia   . Hypertension   . Osteoporosis     Family History  Problem Relation Age of Onset  . Kidney disease Mother   . Cancer Father   . Stroke Brother   . Lung disease Brother   . Rheum arthritis Daughter   . Rheum arthritis Daughter   . Colon cancer Neg Hx   . Esophageal cancer Neg Hx   . Rectal cancer Neg Hx   . Stomach cancer Neg Hx    Past Surgical History:  Procedure Laterality Date  . CARPAL TUNNEL RELEASE    . COLONOSCOPY  12/23/2010  Mild pancolonic diverticulosis. Small internal hemorroids  . COLONOSCOPY  08/2019  . ESOPHAGOGASTRODUODENOSCOPY  05/09/2013   Hiatal Hernia. Presbyesophagus. Minimal antral gastritis.  Marland Kitchen JOINT REPLACEMENT     BIL knee   . KNEE ARTHROPLASTY     Right knee 2012, Left knee total 2009  . TUBAL LIGATION Bilateral   . UPPER GI ENDOSCOPY  08/2019    Social History   Social History Narrative  . Not on file    There is no immunization history on file for this patient.   Objective: Vital Signs: BP 126/78 (BP Location: Left Arm, Patient Position: Sitting, Cuff Size: Normal)   Pulse (!) 56   Resp 13   Ht 5\' 3"  (1.6 m)   Wt 167 lb 3.2 oz (75.8 kg)   BMI 29.62 kg/m    Physical Exam Vitals and nursing note reviewed.  Constitutional:      Appearance: She is well-developed.  HENT:     Head: Normocephalic and atraumatic.  Eyes:     Conjunctiva/sclera: Conjunctivae normal.  Pulmonary:     Effort: Pulmonary effort is normal.  Abdominal:     General: Bowel sounds are normal.     Palpations: Abdomen is soft.  Musculoskeletal:     Cervical back: Normal range of motion.  Lymphadenopathy:     Cervical: No cervical adenopathy.  Skin:    General: Skin is warm and dry.     Capillary Refill: Capillary refill takes less than 2 seconds.  Neurological:     Mental Status: She is alert and oriented to person, place, and time.  Psychiatric:        Behavior: Behavior normal.      Musculoskeletal Exam: C-spine, thoracic spine, lumbar spine have good range of motion.  She has full range of motion of both shoulders with discomfort in the right shoulder.  Elbow joints have good range of motion no tenderness or inflammation.  She has limited range of motion of both wrist joints but no tenderness or synovitis was noted.  She has ulnar deviation of MCP joints with thickening but no inflammation.  She has PIP and DIP thickening consistent with osteoarthritis of both hands.  Hip joints have good range of motion with no discomfort.  Bilateral knee replacements have good range of motion with discomfort bilaterally.  Warmth of the right knee but no effusion noted.  Ankle joints have good range of motion with no tenderness or inflammation.  No tenderness of MTP joints.  CDAI Exam: CDAI Score: 1.4  Patient Global: 2 mm; Provider Global: 2 mm Swollen:  0 ; Tender: 1  Joint Exam 01/16/2020      Right  Left  Glenohumeral   Tender        Investigation: No additional findings.  Imaging: XR Shoulder Right  Result Date: 01/16/2020 No glenohumeral joint space narrowing was noted.  Inferior spurring was noted.  Acromioclavicular arthritis was noted.  No chondrocalcinosis was noted. Impression: These findings are consistent with acromioclavicular arthritis.   Recent Labs: Lab Results  Component Value Date   WBC 4.7 09/18/2019   HGB 12.4 09/18/2019   PLT 308 09/18/2019   NA 138 09/18/2019   K 3.6 09/18/2019   CL 98 09/18/2019   CO2 30 09/18/2019   GLUCOSE 94 09/18/2019   BUN 21 09/18/2019   CREATININE 1.18 (H) 09/18/2019   BILITOT 0.3 09/18/2019   ALKPHOS 48 03/25/2017   AST 14 09/18/2019   ALT 7 09/18/2019   PROT 7.6 09/18/2019  ALBUMIN 3.8 03/25/2017   CALCIUM 9.4 09/18/2019   GFRAA 50 (L) 09/18/2019   QFTBGOLDPLUS NEGATIVE 06/15/2019    Speciality Comments: TB Gold: 03/25/17 Neg  Procedures:  Large Joint Inj: R subacromial bursa on 01/16/2020 10:58 AM Indications: pain Details: 27 G 1.5 in needle, posterior approach  Arthrogram: No  Medications: 1.5 mL lidocaine 1 %; 40 mg triamcinolone acetonide 40 MG/ML Aspirate: 0 mL Outcome: tolerated well, no immediate complications Procedure, treatment alternatives, risks and benefits explained, specific risks discussed. Consent was given by the patient. Immediately prior to procedure a time out was called to verify the correct patient, procedure, equipment, support staff and site/side marked as required. Patient was prepped and draped in the usual sterile fashion.     Allergies: Patient has no known allergies.   Assessment / Plan:     Visit Diagnoses: Seropositive rheumatoid arthritis of multiple sites ([place]) - +RF,+anti CCP: She has no tenderness or synovitis on examination today.  She has not had any recent rheumatoid arthritis flares.  She presents today with right  shoulder discomfort due to subdeltoid bursitis.  X-rays of the right shoulder were updated today.  She requested a right shoulder subacromial cortisone injection.  She tolerated the procedure well.  She continues to have chronic pain in both knee replacements and plans on following up with Dr. Bella Kennedy if her discomfort persists or worsens.  She is not having any other joint pain or inflammation at this time.  Overall she is clinically been doing well on Enbrel 50 mg subcutaneous injections once weekly.  She has missed 1 dose of Enbrel due to needing a refill.  A refill of Enbrel sent to the pharmacy today.  CBC and CMP were drawn today to monitor for drug toxicity.  She will continue on Enbrel as prescribed.  She was advised to notify us if she develops increased joint pain or joint swelling.  She will follow-up in the office in 5 months.- Plan: etanercept (ENBREL SURECLICK) 50 MG/ML injection  High risk medication use - Enbrel Sureclick 50 mg sq every 7 days.  Last TB gold negative 06/15/2019.  CBC and CMP were drawn on 09/18/2019.  She is overdue to update lab work today.  Order for CBC and CMP were released.  She will return for lab work in August and every 3 months to monitor for drug toxicity.  Standing orders are in place we discussed the importance of holding Enbrel if she develops any signs or symptoms of an infection and to resume once the infection has completely cleared.  She has not had any recent infections.  She has received both IV COVID-19 vaccinations.  We discussed the importance of scheduling yearly skin exams while on Enbrel.- Plan: CBC with Differential/Platelet, COMPLETE METABOLIC PANEL WITH GFR  Subdeltoid bursitis of right shoulder joint -She presents today with increased discomfort in the right shoulder.  She has been experiencing nocturnal pain and discomfort with abduction.  She has difficulty lifting objects due to the discomfort.  X-rays of the right shoulder were obtained today.  She  requested a cortisone injection.  A right subacromial cortisone injection was performed today in the office.  Procedure note was completed above.  Aftercare was discussed.  She was given a handout of shoulder joint exercises to perform.  She was advised to notify us if her symptoms persist or worsen.  Plan: XR Shoulder Right  Chronic right shoulder pain - She presents today with increased pain in the right shoulder joint.  She has been experiencing nocturnal pain.  She has good range of motion on the right shoulder with some discomfort.   X-rays of the right shoulder were obtained.  She requested a right shoulder joint cortisone injection today.  She tolerated the procedure well.  The procedure note was completed above.  Aftercare was discussed. She was given a handout of shoulder joint exercises to perform.  Plan: XR Shoulder Right  Trapezius muscle spasm - Resolved   History of total knee replacement, bilateral: Chronic pain.  She has good range of motion on exam with some discomfort bilaterally.  She has warmth of the right knee but no effusion noted.  She was advised to follow-up with Dr. Ronnie Derby if her discomfort persists or worsens.  Age-related osteoporosis without current pathological fracture - DEXA on 02/01/2019 1/3 distal radius BMD 0.493 with T score -3.3.  Thoracic kyphosis noted. She completed 2 years of Forteo 20 mcg on 06/09/2019.  Prior to [facility] she had 3 Reclast IV infusions.  She resumed Reclast once yearly.  Her last infusion was in December 2020 per patient.  She is taking a vitamin D supplement.  History of vitamin D deficiency: She is taking a vitamin D supplement.   History of gastroesophageal reflux (GERD)  History of hypertension: BP was 126/78 today.     Orders: Orders Placed This Encounter  Procedures  . Large Joint Inj  . XR Shoulder Right  . CBC with Differential/Platelet  . COMPLETE METABOLIC PANEL WITH GFR   Meds ordered this encounter  Medications  .  etanercept (ENBREL SURECLICK) 50 MG/ML injection    Sig: Inject 0.98 mLs (50 mg total) into the skin once a week.    Dispense:  12 mL    Refill:  0    Face-to-face time spent with patient was 30 minutes. Greater than 50% of time was spent in counseling and coordination of care.  Follow-Up Instructions: Return in about 5 months (around 06/17/2020) for Rheumatoid arthritis.   Hazel Sams, PA-C  I examined and evaluated the patient with Hazel Sams PA.  Her rheumatoid arthritis is well controlled without much synovitis on Enbrel.  She has been having discomfort in her right shoulder joint.  The x-ray was consistent with acromioclavicular joint arthritis.  After informed consent was obtained, I injected her right acromioclavicular joint with cortisone as described above.  The plan of care was discussed as noted above.  Bo Merino, MD  Note - This record has been created using Editor, commissioning.  Chart creation errors have been sought, but may not always  have been located. Such creation errors do not reflect on  the standard of medical care.

## 2020-01-16 ENCOUNTER — Ambulatory Visit (INDEPENDENT_AMBULATORY_CARE_PROVIDER_SITE_OTHER): Payer: Medicare Other

## 2020-01-16 ENCOUNTER — Encounter: Payer: Self-pay | Admitting: Rheumatology

## 2020-01-16 ENCOUNTER — Other Ambulatory Visit: Payer: Self-pay

## 2020-01-16 ENCOUNTER — Ambulatory Visit (INDEPENDENT_AMBULATORY_CARE_PROVIDER_SITE_OTHER): Payer: Medicare Other | Admitting: Rheumatology

## 2020-01-16 VITALS — BP 126/78 | HR 56 | Resp 13 | Ht 63.0 in | Wt 167.2 lb

## 2020-01-16 DIAGNOSIS — Z96653 Presence of artificial knee joint, bilateral: Secondary | ICD-10-CM

## 2020-01-16 DIAGNOSIS — M25511 Pain in right shoulder: Secondary | ICD-10-CM

## 2020-01-16 DIAGNOSIS — G8929 Other chronic pain: Secondary | ICD-10-CM

## 2020-01-16 DIAGNOSIS — M7551 Bursitis of right shoulder: Secondary | ICD-10-CM

## 2020-01-16 DIAGNOSIS — Z8719 Personal history of other diseases of the digestive system: Secondary | ICD-10-CM | POA: Diagnosis not present

## 2020-01-16 DIAGNOSIS — M62838 Other muscle spasm: Secondary | ICD-10-CM

## 2020-01-16 DIAGNOSIS — Z79899 Other long term (current) drug therapy: Secondary | ICD-10-CM

## 2020-01-16 DIAGNOSIS — M0579 Rheumatoid arthritis with rheumatoid factor of multiple sites without organ or systems involvement: Secondary | ICD-10-CM

## 2020-01-16 DIAGNOSIS — Z8639 Personal history of other endocrine, nutritional and metabolic disease: Secondary | ICD-10-CM

## 2020-01-16 DIAGNOSIS — M810 Age-related osteoporosis without current pathological fracture: Secondary | ICD-10-CM | POA: Diagnosis not present

## 2020-01-16 DIAGNOSIS — Z8679 Personal history of other diseases of the circulatory system: Secondary | ICD-10-CM | POA: Diagnosis not present

## 2020-01-16 MED ORDER — ENBREL SURECLICK 50 MG/ML ~~LOC~~ SOAJ: 50.0000 mg | SUBCUTANEOUS | 0 refills | Status: DC

## 2020-01-16 NOTE — Patient Instructions (Signed)
Standing Labs We placed an order today for your standing lab work.    Please come back and get your standing labs in August and every 3 months   We have open lab daily Monday through Thursday from 8:30-12:30 PM and 1:30-4:30 PM and Friday from 8:30-12:30 PM and 1:30-4:00 PM at the office of Dr. Bo Merino.   You may experience shorter wait times on Monday and Friday afternoons. The office is located at [address] [place], [place], [place] [address] No appointment is necessary.   Labs are drawn by [facility].  You may receive a bill from [facility] for your lab work.  If you wish to have your labs drawn at another location, please call the office 24 hours in advance to send orders.  If you have any questions regarding directions or hours of operation,  please call [phone].   Just as a reminder please drink plenty of water prior to coming for your lab work. Thanks!   Shoulder Exercises Ask your health care provider which exercises are safe for you. Do exercises exactly as told by your health care provider and adjust them as directed. It is normal to feel mild stretching, pulling, tightness, or discomfort as you do these exercises. Stop right away if you feel sudden pain or your pain gets worse. Do not begin these exercises until told by your health care provider. Stretching exercises External rotation and abduction This exercise is sometimes called corner stretch. This exercise rotates your arm outward (external rotation) and moves your arm out from your body (abduction). 1. Stand in a doorway with one of your feet slightly in front of the other. This is called a staggered stance. If you cannot reach your forearms to the door frame, stand facing a corner of a room. 2. Choose one of the following positions as told by your health care provider: ? Place your hands and forearms on the door frame above your head. ? Place your hands and forearms on the door frame at the height of your  head. ? Place your hands on the door frame at the height of your elbows. 3. Slowly move your weight onto your front foot until you feel a stretch across your chest and in the front of your shoulders. Keep your head and chest upright and keep your abdominal muscles tight. 4. Hold for __________ seconds. 5. To release the stretch, shift your weight to your back foot. Repeat __________ times. Complete this exercise __________ times a day. Extension, standing 1. Stand and hold a broomstick, a cane, or a similar object behind your back. ? Your hands should be a little wider than shoulder width apart. ? Your palms should face away from your back. 2. Keeping your elbows straight and your shoulder muscles relaxed, move the stick away from your body until you feel a stretch in your shoulders (extension). ? Avoid shrugging your shoulders while you move the stick. Keep your shoulder blades tucked down toward the middle of your back. 3. Hold for __________ seconds. 4. Slowly return to the starting position. Repeat __________ times. Complete this exercise __________ times a day. Range-of-motion exercises Pendulum  1. Stand near a wall or a surface that you can hold onto for balance. 2. Bend at the waist and let your left / right arm hang straight down. Use your other arm to support you. Keep your back straight and do not lock your knees. 3. Relax your left / right arm and shoulder muscles, and move your  hips and your trunk so your left / right arm swings freely. Your arm should swing because of the motion of your body, not because you are using your arm or shoulder muscles. 4. Keep moving your hips and trunk so your arm swings in the following directions, as told by your health care provider: ? Side to side. ? Forward and backward. ? In clockwise and counterclockwise circles. 5. Continue each motion for __________ seconds, or for as long as told by your health care provider. 6. Slowly return to the  starting position. Repeat __________ times. Complete this exercise __________ times a day. Shoulder flexion, standing  1. Stand and hold a broomstick, a cane, or a similar object. Place your hands a little more than shoulder width apart on the object. Your left / right hand should be palm up, and your other hand should be palm down. 2. Keep your elbow straight and your shoulder muscles relaxed. Push the stick up with your healthy arm to raise your left / right arm in front of your body, and then over your head until you feel a stretch in your shoulder (flexion). ? Avoid shrugging your shoulder while you raise your arm. Keep your shoulder blade tucked down toward the middle of your back. 3. Hold for __________ seconds. 4. Slowly return to the starting position. Repeat __________ times. Complete this exercise __________ times a day. Shoulder abduction, standing 1. Stand and hold a broomstick, a cane, or a similar object. Place your hands a little more than shoulder width apart on the object. Your left / right hand should be palm up, and your other hand should be palm down. 2. Keep your elbow straight and your shoulder muscles relaxed. Push the object across your body toward your left / right side. Raise your left / right arm to the side of your body (abduction) until you feel a stretch in your shoulder. ? Do not raise your arm above shoulder height unless your health care provider tells you to do that. ? If directed, raise your arm over your head. ? Avoid shrugging your shoulder while you raise your arm. Keep your shoulder blade tucked down toward the middle of your back. 3. Hold for __________ seconds. 4. Slowly return to the starting position. Repeat __________ times. Complete this exercise __________ times a day. Internal rotation  1. Place your left / right hand behind your back, palm up. 2. Use your other hand to dangle an exercise band, a towel, or a similar object over your shoulder. Grasp  the band with your left / right hand so you are holding on to both ends. 3. Gently pull up on the band until you feel a stretch in the front of your left / right shoulder. The movement of your arm toward the center of your body is called internal rotation. ? Avoid shrugging your shoulder while you raise your arm. Keep your shoulder blade tucked down toward the middle of your back. 4. Hold for __________ seconds. 5. Release the stretch by letting go of the band and lowering your hands. Repeat __________ times. Complete this exercise __________ times a day. Strengthening exercises External rotation  1. Sit in a stable chair without armrests. 2. Secure an exercise band to a stable object at elbow height on your left / right side. 3. Place a soft object, such as a folded towel or a small pillow, between your left / right upper arm and your body to move your elbow about 4 inches (  10 cm) away from your side. 4. Hold the end of the exercise band so it is tight and there is no slack. 5. Keeping your elbow pressed against the soft object, slowly move your forearm out, away from your abdomen (external rotation). Keep your body steady so only your forearm moves. 6. Hold for __________ seconds. 7. Slowly return to the starting position. Repeat __________ times. Complete this exercise __________ times a day. Shoulder abduction  1. Sit in a stable chair without armrests, or stand up. 2. Hold a __________ weight in your left / right hand, or hold an exercise band with both hands. 3. Start with your arms straight down and your left / right palm facing in, toward your body. 4. Slowly lift your left / right hand out to your side (abduction). Do not lift your hand above shoulder height unless your health care provider tells you that this is safe. ? Keep your arms straight. ? Avoid shrugging your shoulder while you do this movement. Keep your shoulder blade tucked down toward the middle of your back. 5. Hold  for __________ seconds. 6. Slowly lower your arm, and return to the starting position. Repeat __________ times. Complete this exercise __________ times a day. Shoulder extension 1. Sit in a stable chair without armrests, or stand up. 2. Secure an exercise band to a stable object in front of you so it is at shoulder height. 3. Hold one end of the exercise band in each hand. Your palms should face each other. 4. Straighten your elbows and lift your hands up to shoulder height. 5. Step back, away from the secured end of the exercise band, until the band is tight and there is no slack. 6. Squeeze your shoulder blades together as you pull your hands down to the sides of your thighs (extension). Stop when your hands are straight down by your sides. Do not let your hands go behind your body. 7. Hold for __________ seconds. 8. Slowly return to the starting position. Repeat __________ times. Complete this exercise __________ times a day. Shoulder row 1. Sit in a stable chair without armrests, or stand up. 2. Secure an exercise band to a stable object in front of you so it is at waist height. 3. Hold one end of the exercise band in each hand. Position your palms so that your thumbs are facing the ceiling (neutral position). 4. Bend each of your elbows to a 90-degree angle (right angle) and keep your upper arms at your sides. 5. Step back until the band is tight and there is no slack. 6. Slowly pull your elbows back behind you. 7. Hold for __________ seconds. 8. Slowly return to the starting position. Repeat __________ times. Complete this exercise __________ times a day. Shoulder press-ups  1. Sit in a stable chair that has armrests. Sit upright, with your feet flat on the floor. 2. Put your hands on the armrests so your elbows are bent and your fingers are pointing forward. Your hands should be about even with the sides of your body. 3. Push down on the armrests and use your arms to lift yourself  off the chair. Straighten your elbows and lift yourself up as much as you comfortably can. ? Move your shoulder blades down, and avoid letting your shoulders move up toward your ears. ? Keep your feet on the ground. As you get stronger, your feet should support less of your body weight as you lift yourself up. 4. Hold for __________ seconds.  5. Slowly lower yourself back into the chair. Repeat __________ times. Complete this exercise __________ times a day. Wall push-ups  1. Stand so you are facing a stable wall. Your feet should be about one arm-length away from the wall. 2. Lean forward and place your palms on the wall at shoulder height. 3. Keep your feet flat on the floor as you bend your elbows and lean forward toward the wall. 4. Hold for __________ seconds. 5. Straighten your elbows to push yourself back to the starting position. Repeat __________ times. Complete this exercise __________ times a day. This information is not intended to replace advice given to you by your health care provider. Make sure you discuss any questions you have with your health care provider. Document Revised: 12/01/2018 Document Reviewed: 09/08/2018 Elsevier Patient Education  [place].

## 2020-01-17 LAB — COMPLETE METABOLIC PANEL WITH GFR
AG Ratio: 1 (calc) (ref 1.0–2.5)
ALT: 7 U/L (ref 6–29)
AST: 14 U/L (ref 10–35)
Albumin: 3.8 g/dL (ref 3.6–5.1)
Alkaline phosphatase (APISO): 58 U/L (ref 37–153)
BUN/Creatinine Ratio: 15 (calc) (ref 6–22)
BUN: 17 mg/dL (ref 7–25)
CO2: 32 mmol/L (ref 20–32)
Calcium: 9.1 mg/dL (ref 8.6–10.4)
Chloride: 100 mmol/L (ref 98–110)
Creat: 1.12 mg/dL — ABNORMAL HIGH (ref 0.60–0.88)
GFR, Est African American: 54 mL/min/{1.73_m2} — ABNORMAL LOW (ref >=60)
GFR, Est Non African American: 46 mL/min/{1.73_m2} — ABNORMAL LOW (ref >=60)
Globulin: 3.7 g/dL (calc) (ref 1.9–3.7)
Glucose, Bld: 81 mg/dL (ref 65–99)
Potassium: 3.2 mmol/L — ABNORMAL LOW (ref 3.5–5.3)
Sodium: 140 mmol/L (ref 135–146)
Total Bilirubin: 0.5 mg/dL (ref 0.2–1.2)
Total Protein: 7.5 g/dL (ref 6.1–8.1)

## 2020-01-17 LAB — CBC WITH DIFFERENTIAL/PLATELET
Absolute Monocytes: 414 cells/uL (ref 200–950)
Basophils Absolute: 31 cells/uL (ref 0–200)
Basophils Relative: 0.7 %
Eosinophils Absolute: 343 cells/uL (ref 15–500)
Eosinophils Relative: 7.8 %
HCT: 36.6 % (ref 35.0–45.0)
Hemoglobin: 12.1 g/dL (ref 11.7–15.5)
Lymphs Abs: 1549 cells/uL (ref 850–3900)
MCH: 30.7 pg (ref 27.0–33.0)
MCHC: 33.1 g/dL (ref 32.0–36.0)
MCV: 92.9 fL (ref 80.0–100.0)
MPV: 9.4 fL (ref 7.5–12.5)
Monocytes Relative: 9.4 %
Neutro Abs: 2064 cells/uL (ref 1500–7800)
Neutrophils Relative %: 46.9 %
Platelets: 186 10*3/uL (ref 140–400)
RBC: 3.94 10*6/uL (ref 3.80–5.10)
RDW: 12.2 % (ref 11.0–15.0)
Total Lymphocyte: 35.2 %
WBC: 4.4 10*3/uL (ref 3.8–10.8)

## 2020-01-17 NOTE — Progress Notes (Signed)
CBC WNL.  Creatinine is elevated-1.12, which is stable. GFR is 46.  Please advise the patient to avoid taking NSAIDs.  Potassium is low-3.2.  Please forward labs to PCP.

## 2020-01-21 DIAGNOSIS — N183 Chronic kidney disease, stage 3 unspecified: Secondary | ICD-10-CM | POA: Diagnosis not present

## 2020-01-21 DIAGNOSIS — M069 Rheumatoid arthritis, unspecified: Secondary | ICD-10-CM | POA: Diagnosis not present

## 2020-01-21 DIAGNOSIS — R7303 Prediabetes: Secondary | ICD-10-CM | POA: Diagnosis not present

## 2020-01-21 DIAGNOSIS — I129 Hypertensive chronic kidney disease with stage 1 through stage 4 chronic kidney disease, or unspecified chronic kidney disease: Secondary | ICD-10-CM | POA: Diagnosis not present

## 2020-03-03 DIAGNOSIS — Z0000 Encounter for general adult medical examination without abnormal findings: Secondary | ICD-10-CM | POA: Diagnosis not present

## 2020-03-03 DIAGNOSIS — Z1331 Encounter for screening for depression: Secondary | ICD-10-CM | POA: Diagnosis not present

## 2020-03-03 DIAGNOSIS — Z1339 Encounter for screening examination for other mental health and behavioral disorders: Secondary | ICD-10-CM | POA: Diagnosis not present

## 2020-03-03 DIAGNOSIS — Z6829 Body mass index (BMI) 29.0-29.9, adult: Secondary | ICD-10-CM | POA: Diagnosis not present

## 2020-03-03 DIAGNOSIS — E663 Overweight: Secondary | ICD-10-CM | POA: Diagnosis not present

## 2020-03-03 DIAGNOSIS — Z139 Encounter for screening, unspecified: Secondary | ICD-10-CM | POA: Diagnosis not present

## 2020-03-04 DIAGNOSIS — Z471 Aftercare following joint replacement surgery: Secondary | ICD-10-CM | POA: Diagnosis not present

## 2020-03-04 DIAGNOSIS — M25561 Pain in right knee: Secondary | ICD-10-CM | POA: Diagnosis not present

## 2020-03-04 DIAGNOSIS — Z96653 Presence of artificial knee joint, bilateral: Secondary | ICD-10-CM | POA: Diagnosis not present

## 2020-03-04 DIAGNOSIS — M25562 Pain in left knee: Secondary | ICD-10-CM | POA: Diagnosis not present

## 2020-03-04 DIAGNOSIS — Z96652 Presence of left artificial knee joint: Secondary | ICD-10-CM | POA: Diagnosis not present

## 2020-03-04 DIAGNOSIS — Z96651 Presence of right artificial knee joint: Secondary | ICD-10-CM | POA: Diagnosis not present

## 2020-03-10 DIAGNOSIS — R7303 Prediabetes: Secondary | ICD-10-CM | POA: Diagnosis not present

## 2020-03-10 DIAGNOSIS — E876 Hypokalemia: Secondary | ICD-10-CM | POA: Diagnosis not present

## 2020-03-10 DIAGNOSIS — N1831 Chronic kidney disease, stage 3a: Secondary | ICD-10-CM | POA: Diagnosis not present

## 2020-03-10 DIAGNOSIS — M069 Rheumatoid arthritis, unspecified: Secondary | ICD-10-CM | POA: Diagnosis not present

## 2020-03-23 DIAGNOSIS — M069 Rheumatoid arthritis, unspecified: Secondary | ICD-10-CM | POA: Diagnosis not present

## 2020-03-23 DIAGNOSIS — E876 Hypokalemia: Secondary | ICD-10-CM | POA: Diagnosis not present

## 2020-03-23 DIAGNOSIS — N1831 Chronic kidney disease, stage 3a: Secondary | ICD-10-CM | POA: Diagnosis not present

## 2020-03-23 DIAGNOSIS — R7303 Prediabetes: Secondary | ICD-10-CM | POA: Diagnosis not present

## 2020-03-27 DIAGNOSIS — R921 Mammographic calcification found on diagnostic imaging of breast: Secondary | ICD-10-CM | POA: Diagnosis not present

## 2020-04-04 DIAGNOSIS — E876 Hypokalemia: Secondary | ICD-10-CM | POA: Diagnosis not present

## 2020-04-22 DIAGNOSIS — M069 Rheumatoid arthritis, unspecified: Secondary | ICD-10-CM | POA: Diagnosis not present

## 2020-04-22 DIAGNOSIS — N1831 Chronic kidney disease, stage 3a: Secondary | ICD-10-CM | POA: Diagnosis not present

## 2020-04-22 DIAGNOSIS — R7303 Prediabetes: Secondary | ICD-10-CM | POA: Diagnosis not present

## 2020-04-23 ENCOUNTER — Other Ambulatory Visit: Payer: Self-pay | Admitting: Radiology

## 2020-04-23 DIAGNOSIS — R921 Mammographic calcification found on diagnostic imaging of breast: Secondary | ICD-10-CM | POA: Diagnosis not present

## 2020-04-23 DIAGNOSIS — N6012 Diffuse cystic mastopathy of left breast: Secondary | ICD-10-CM | POA: Diagnosis not present

## 2020-05-23 DIAGNOSIS — M069 Rheumatoid arthritis, unspecified: Secondary | ICD-10-CM | POA: Diagnosis not present

## 2020-05-23 DIAGNOSIS — E876 Hypokalemia: Secondary | ICD-10-CM | POA: Diagnosis not present

## 2020-05-23 DIAGNOSIS — N1831 Chronic kidney disease, stage 3a: Secondary | ICD-10-CM | POA: Diagnosis not present

## 2020-05-23 DIAGNOSIS — R7303 Prediabetes: Secondary | ICD-10-CM | POA: Diagnosis not present

## 2020-05-28 ENCOUNTER — Telehealth: Payer: Self-pay | Admitting: *Deleted

## 2020-05-28 NOTE — Telephone Encounter (Signed)
Received a refill request from [facility] for Enbrel. Patient last had labs on 01/16/2020. Patient will need updated labs prior to refill. Attempted to contact the patient and left message for patient to call the office.

## 2020-05-29 ENCOUNTER — Other Ambulatory Visit: Payer: Self-pay

## 2020-05-29 ENCOUNTER — Other Ambulatory Visit: Payer: Self-pay | Admitting: *Deleted

## 2020-05-29 DIAGNOSIS — Z111 Encounter for screening for respiratory tuberculosis: Secondary | ICD-10-CM

## 2020-05-29 DIAGNOSIS — Z79899 Other long term (current) drug therapy: Secondary | ICD-10-CM

## 2020-05-29 DIAGNOSIS — Z9225 Personal history of immunosupression therapy: Secondary | ICD-10-CM

## 2020-05-30 ENCOUNTER — Other Ambulatory Visit: Payer: Self-pay | Admitting: *Deleted

## 2020-05-30 DIAGNOSIS — M0579 Rheumatoid arthritis with rheumatoid factor of multiple sites without organ or systems involvement: Secondary | ICD-10-CM

## 2020-05-30 MED ORDER — ENBREL SURECLICK 50 MG/ML ~~LOC~~ SOAJ: 50.0000 mg | SUBCUTANEOUS | 0 refills | Status: DC

## 2020-05-30 NOTE — Progress Notes (Signed)
Creatinine is elevated-1.23 and GFR is low-41.  Please advise the patient to avoid taking NSAIDs.  Please forward lab work to PCP.  RBC count is borderline low.   We will continue to monitor.

## 2020-05-30 NOTE — Telephone Encounter (Signed)
-----   Message from Ofilia Neas, PA-C sent at 05/30/2020  8:37 AM EDT ----- Creatinine is elevated-1.23 and GFR is low-41.  Please advise the patient to avoid taking NSAIDs.  Please forward lab work to PCP.  RBC count is borderline low.   We will continue to monitor.

## 2020-05-30 NOTE — Telephone Encounter (Signed)
Last Visit: 01/16/2020 Next Visit: 06/18/2020 Labs: 05/30/2020 Creatinine is elevated-1.23 and GFR is low-41.  RBC count is borderline low.   TB Gold: 06/15/2019 Neg (updated 05/29/2020 results pending.)  Current Dose per office note 0/73/7106: Enbrel Sureclick 50 mg sq every 7 days  DX: Seropositive rheumatoid arthritis of multiple sites  Okay to refill Enbrel?

## 2020-05-31 LAB — COMPLETE METABOLIC PANEL WITH GFR
AG Ratio: 1 (calc) (ref 1.0–2.5)
ALT: 6 U/L (ref 6–29)
AST: 14 U/L (ref 10–35)
Albumin: 3.7 g/dL (ref 3.6–5.1)
Alkaline phosphatase (APISO): 52 U/L (ref 37–153)
BUN/Creatinine Ratio: 19 (calc) (ref 6–22)
BUN: 23 mg/dL (ref 7–25)
CO2: 27 mmol/L (ref 20–32)
Calcium: 9.8 mg/dL (ref 8.6–10.4)
Chloride: 100 mmol/L (ref 98–110)
Creat: 1.23 mg/dL — ABNORMAL HIGH (ref 0.60–0.88)
GFR, Est African American: 48 mL/min/{1.73_m2} — ABNORMAL LOW (ref >=60)
GFR, Est Non African American: 41 mL/min/{1.73_m2} — ABNORMAL LOW (ref >=60)
Globulin: 3.8 g/dL (calc) — ABNORMAL HIGH (ref 1.9–3.7)
Glucose, Bld: 109 mg/dL — ABNORMAL HIGH (ref 65–99)
Potassium: 3.8 mmol/L (ref 3.5–5.3)
Sodium: 137 mmol/L (ref 135–146)
Total Bilirubin: 0.4 mg/dL (ref 0.2–1.2)
Total Protein: 7.5 g/dL (ref 6.1–8.1)

## 2020-05-31 LAB — QUANTIFERON-TB GOLD PLUS
Mitogen-NIL: 8.58 IU/mL
NIL: 0.01 IU/mL
QuantiFERON-TB Gold Plus: NEGATIVE
TB1-NIL: 0.01 IU/mL
TB2-NIL: 0 IU/mL

## 2020-05-31 LAB — CBC WITH DIFFERENTIAL/PLATELET
Absolute Monocytes: 351 cells/uL (ref 200–950)
Basophils Absolute: 18 cells/uL (ref 0–200)
Basophils Relative: 0.4 %
Eosinophils Absolute: 374 cells/uL (ref 15–500)
Eosinophils Relative: 8.3 %
HCT: 35 % (ref 35.0–45.0)
Hemoglobin: 12.1 g/dL (ref 11.7–15.5)
Lymphs Abs: 1908 cells/uL (ref 850–3900)
MCH: 32 pg (ref 27.0–33.0)
MCHC: 34.6 g/dL (ref 32.0–36.0)
MCV: 92.6 fL (ref 80.0–100.0)
MPV: 9.6 fL (ref 7.5–12.5)
Monocytes Relative: 7.8 %
Neutro Abs: 1850 cells/uL (ref 1500–7800)
Neutrophils Relative %: 41.1 %
Platelets: 190 10*3/uL (ref 140–400)
RBC: 3.78 10*6/uL — ABNORMAL LOW (ref 3.80–5.10)
RDW: 12 % (ref 11.0–15.0)
Total Lymphocyte: 42.4 %
WBC: 4.5 10*3/uL (ref 3.8–10.8)

## 2020-05-31 NOTE — Progress Notes (Signed)
TB gold is negative.

## 2020-06-04 NOTE — Progress Notes (Deleted)
Office Visit Note  Patient: Andrea [name]             Date of Birth: 01-May-1941           MRN: [id number]             PCP: Marco Collie, MD Referring: Marco Collie, MD Visit Date: 06/18/2020 Occupation: @GUAROCC @  Subjective:  No chief complaint on file.   History of Present Illness: Andrea [name] is a 79 y.o. female ***   Activities of Daily Living:  Patient reports morning stiffness for *** {minute/hour:19697}.   Patient {ACTIONS;DENIES/REPORTS:21021675::"Denies"} nocturnal pain.  Difficulty dressing/grooming: {ACTIONS;DENIES/REPORTS:21021675::"Denies"} Difficulty climbing stairs: {ACTIONS;DENIES/REPORTS:21021675::"Denies"} Difficulty getting out of chair: {ACTIONS;DENIES/REPORTS:21021675::"Denies"} Difficulty using hands for taps, buttons, cutlery, and/or writing: {ACTIONS;DENIES/REPORTS:21021675::"Denies"}  No Rheumatology ROS completed.   PMFS History:  Patient Active Problem List   Diagnosis Date Noted  . History of hypertension 02/08/2017  . History of gastroesophageal reflux (GERD) 02/08/2017  . High risk medication use 12/27/2016  . Vitamin D deficiency 12/27/2016  . History of total knee replacement, bilateral 08/27/2016  . Age-related osteoporosis without current pathological fracture 08/27/2016  . Seropositive rheumatoid arthritis of multiple sites ([place]) 06/21/2016    Past Medical History:  Diagnosis Date  . Arthritis   . Cataract   . GERD (gastroesophageal reflux disease)   . History of colon polyps   . Hypercholesterolemia   . Hypertension   . Osteoporosis     Family History  Problem Relation Age of Onset  . Kidney disease Mother   . Cancer Father   . Stroke Brother   . Lung disease Brother   . Rheum arthritis Daughter   . Rheum arthritis Daughter   . Colon cancer Neg Hx   . Esophageal cancer Neg Hx   . Rectal cancer Neg Hx   . Stomach cancer Neg Hx    Past Surgical History:  Procedure Laterality Date  . CARPAL TUNNEL RELEASE    .  COLONOSCOPY  12/23/2010   Mild pancolonic diverticulosis. Small internal hemorroids  . COLONOSCOPY  08/2019  . ESOPHAGOGASTRODUODENOSCOPY  05/09/2013   Hiatal Hernia. Presbyesophagus. Minimal antral gastritis.  [facility]land Kitchen JOINT REPLACEMENT     BIL knee   . KNEE ARTHROPLASTY     Right knee 2012, Left knee total 2009  . TUBAL LIGATION Bilateral   . UPPER GI ENDOSCOPY  08/2019   Social History   Social History Narrative  . Not on file    There is no immunization history on file for this patient.   Objective: Vital Signs: There were no vitals taken for this visit.   Physical Exam   Musculoskeletal Exam: ***  CDAI Exam: CDAI Score: -- Patient Global: --; Provider Global: -- Swollen: --; Tender: -- Joint Exam 06/18/2020   No joint exam has been documented for this visit   There is currently no information documented on the homunculus. Go to the Rheumatology activity and complete the homunculus joint exam.  Investigation: No additional findings.  Imaging: No results found.  Recent Labs: Lab Results  Component Value Date   WBC 4.5 05/29/2020   HGB 12.1 05/29/2020   PLT 190 05/29/2020   NA 137 05/29/2020   K 3.8 05/29/2020   CL 100 05/29/2020   CO2 27 05/29/2020   GLUCOSE 109 (H) 05/29/2020   BUN 23 05/29/2020   CREATININE 1.23 (H) 05/29/2020   BILITOT 0.4 05/29/2020   ALKPHOS 48 03/25/2017   AST 14 05/29/2020   ALT 6 05/29/2020  PROT 7.5 05/29/2020   ALBUMIN 3.8 03/25/2017   CALCIUM 9.8 05/29/2020   GFRAA 48 (L) 05/29/2020   QFTBGOLDPLUS NEGATIVE 05/29/2020    Speciality Comments: TB Gold: 03/25/17 Neg  Procedures:  No procedures performed Allergies: Patient has no known allergies.   Assessment / Plan:     Visit Diagnoses: No diagnosis found.  Orders: No orders of the defined types were placed in this encounter.  No orders of the defined types were placed in this encounter.   Face-to-face time spent with patient was *** minutes. Greater than 50% of time  was spent in counseling and coordination of care.  Follow-Up Instructions: No follow-ups on file.   Earnestine Mealing, CMA  Note - This record has been created using Editor, commissioning.  Chart creation errors have been sought, but may not always  have been located. Such creation errors do not reflect on  the standard of medical care.

## 2020-06-13 DIAGNOSIS — Z23 Encounter for immunization: Secondary | ICD-10-CM | POA: Diagnosis not present

## 2020-06-17 NOTE — Progress Notes (Signed)
Office Visit Note  Patient: Andrea [name]             Date of Birth: 20-Feb-1941           MRN: [id number]             PCP: Marco Collie, MD Referring: Marco Collie, MD Visit Date: 06/30/2020 Occupation: @GUAROCC @  Subjective:  Right hip pain.   History of Present Illness: Andrea [name] is a 79 y.o. female with seropositive rheumatoid arthritis, osteoarthritis and osteoporosis.  She has been taking Enbrel on a weekly basis and has been tolerating it well.  She states she has some discomfort in her right hip otherwise she is doing well.  She denies any history of joint swelling.  Her last Reclast infusion was on June 28, 2020 which she tolerated well.  Activities of Daily Living:  Patient reports morning stiffness for 0 minutes.   Patient Reports nocturnal pain.  Difficulty dressing/grooming: Denies Difficulty climbing stairs: Denies Difficulty getting out of chair: Denies Difficulty using hands for taps, buttons, cutlery, and/or writing: Reports  Review of Systems  Constitutional: Negative for fatigue.  HENT: Negative for mouth sores, mouth dryness and nose dryness.   Eyes: Negative for pain, itching, visual disturbance and dryness.  Respiratory: Positive for cough. Negative for hemoptysis, shortness of breath and difficulty breathing.   Cardiovascular: Negative for chest pain, palpitations and swelling in legs/feet.  Gastrointestinal: Negative for abdominal pain, blood in stool, constipation and diarrhea.  Endocrine: Negative for increased urination.  Genitourinary: Negative for painful urination.  Musculoskeletal: Negative for arthralgias, joint pain, joint swelling, myalgias, muscle weakness, morning stiffness, muscle tenderness and myalgias.  Skin: Negative for color change, rash and redness.  Allergic/Immunologic: Negative for susceptible to infections.  Neurological: Negative for dizziness, numbness, headaches, memory loss and weakness.  Hematological: Negative for  swollen glands.  Psychiatric/Behavioral: Positive for sleep disturbance. Negative for confusion.    PMFS History:  Patient Active Problem List   Diagnosis Date Noted  . History of hypertension 02/08/2017  . History of gastroesophageal reflux (GERD) 02/08/2017  . High risk medication use 12/27/2016  . Vitamin D deficiency 12/27/2016  . History of total knee replacement, bilateral 08/27/2016  . Age-related osteoporosis without current pathological fracture 08/27/2016  . Seropositive rheumatoid arthritis of multiple sites ([place]) 06/21/2016    Past Medical History:  Diagnosis Date  . Arthritis   . Cataract   . GERD (gastroesophageal reflux disease)   . History of colon polyps   . Hypercholesterolemia   . Hypertension   . Osteoporosis     Family History  Problem Relation Age of Onset  . Kidney disease Mother   . Cancer Father   . Stroke Brother   . Lung disease Brother   . Rheum arthritis Daughter   . Rheum arthritis Daughter   . Colon cancer Neg Hx   . Esophageal cancer Neg Hx   . Rectal cancer Neg Hx   . Stomach cancer Neg Hx    Past Surgical History:  Procedure Laterality Date  . CARPAL TUNNEL RELEASE    . COLONOSCOPY  12/23/2010   Mild pancolonic diverticulosis. Small internal hemorroids  . COLONOSCOPY  08/2019  . ESOPHAGOGASTRODUODENOSCOPY  05/09/2013   Hiatal Hernia. Presbyesophagus. Minimal antral gastritis.  Marland Kitchen JOINT REPLACEMENT     BIL knee   . KNEE ARTHROPLASTY     Right knee 2012, Left knee total 2009  . TUBAL LIGATION Bilateral   . UPPER GI ENDOSCOPY  08/2019  Social History   Social History Narrative  . Not on file   Immunization History  Administered Date(s) Administered  . Moderna SARS-COVID-2 Vaccination 09/14/2019, 10/12/2019     Objective: Vital Signs: BP 116/74 (BP Location: Left Arm, Patient Position: Sitting, Cuff Size: Small)   Pulse 61   Ht 5\' 3"  (1.6 m)   Wt 166 lb 12.8 oz (75.7 kg)   BMI 29.55 kg/m    Physical Exam Vitals and  nursing note reviewed.  Constitutional:      Appearance: She is well-developed.  HENT:     Head: Normocephalic and atraumatic.  Eyes:     Conjunctiva/sclera: Conjunctivae normal.  Cardiovascular:     Rate and Rhythm: Normal rate and regular rhythm.     Heart sounds: Normal heart sounds.  Pulmonary:     Effort: Pulmonary effort is normal.     Breath sounds: Normal breath sounds.  Abdominal:     General: Bowel sounds are normal.     Palpations: Abdomen is soft.  Musculoskeletal:     Cervical back: Normal range of motion.  Lymphadenopathy:     Cervical: No cervical adenopathy.  Skin:    General: Skin is warm and dry.     Capillary Refill: Capillary refill takes less than 2 seconds.  Neurological:     Mental Status: She is alert and oriented to person, place, and time.  Psychiatric:        Behavior: Behavior normal.      Musculoskeletal Exam: She has limited range of motion of her cervical spine.  She had good range of motion of her shoulder joints with some discomfort.  Elbow joints with good range of motion.  She has limited range of motion of bilateral wrist joints.  She has ulnar deviation and synovial thickening with no active synovitis.  She has good range of motion of bilateral hip joints.  Bilateral knee joints are replaced with some warmth but no synovitis.  There was no tenderness over ankles or MTPs.  CDAI Exam: CDAI Score: 0.6  Patient Global: 3 mm; Provider Global: 3 mm Swollen: 0 ; Tender: 0  Joint Exam 06/30/2020   No joint exam has been documented for this visit   There is currently no information documented on the homunculus. Go to the Rheumatology activity and complete the homunculus joint exam.  Investigation: No additional findings.  Imaging: No results found.  Recent Labs: Lab Results  Component Value Date   WBC 4.5 05/29/2020   HGB 12.1 05/29/2020   PLT 190 05/29/2020   NA 137 05/29/2020   K 3.8 05/29/2020   CL 100 05/29/2020   CO2 27  05/29/2020   GLUCOSE 109 (H) 05/29/2020   BUN 23 05/29/2020   CREATININE 1.23 (H) 05/29/2020   BILITOT 0.4 05/29/2020   ALKPHOS 48 03/25/2017   AST 14 05/29/2020   ALT 6 05/29/2020   PROT 7.5 05/29/2020   ALBUMIN 3.8 03/25/2017   CALCIUM 9.8 05/29/2020   GFRAA 48 (L) 05/29/2020   QFTBGOLDPLUS NEGATIVE 05/29/2020    Speciality Comments: TB Gold: 03/25/17 Neg  Procedures:  No procedures performed Allergies: Patient has no known allergies.   Assessment / Plan:     Visit Diagnoses: Seropositive rheumatoid arthritis of multiple sites ([place]) - +RF,+anti CCP: She has severe rheumatoid arthritis with multiple contractures and ulnar deviation.  She has synovial thickening but no synovitis on my examination.  High risk medication use - Enbrel Sureclick 50 mg sq every 7 days.  She has  been tolerating medication well.  She had recent labs which showed elevated creatinine most likely due to diuretic use.  TB gold was negative on May 29, 2020.  Subdeltoid bursitis of right shoulder joint-she has some discomfort range of motion of her right shoulder joint.  Trapezius muscle spasm - Resolved   Age-related osteoporosis without current pathological fracture - DEXA on 02/01/2019 1/3 distal radius BMD 0.493 with T score -3.3. She completed 2 years of Forteo 20 mcg.Prior to [facility] she had 3 Reclast IV infusions.  Her last Reclast infusion was on June 29, 2019.  She will need repeat Reclast infusion this month.  I will get CMP with GFR and we will schedule her Reclast infusion.  History of total knee replacement, bilateral-doing well without any discomfort although she had some warmth on palpation  Right trochanteric bursitis-she complains of some discomfort in her right trochanteric area.  She had tenderness on palpation.  Have given her a handout on IT band stretches.  History of hypertension-blood pressure is stable.  History of vitamin D deficiency-vitamin D was normal last year.  We will check  vitamin D level again.  History of gastroesophageal reflux (GERD)  Educated about COVID-19 virus infection-she needs a booster.  Instructions were reviewed and were placed in the AVS.  Orders: Orders Placed This Encounter  Procedures  . COMPLETE METABOLIC PANEL WITH GFR  . VITAMIN D 25 Hydroxy (Vit-D Deficiency, Fractures)   No orders of the defined types were placed in this encounter.   Follow-Up Instructions: Return in about 5 months (around 11/28/2020) for Rheumatoid arthritis.   Bo Merino, MD  Note - This record has been created using Editor, commissioning.  Chart creation errors have been sought, but may not always  have been located. Such creation errors do not reflect on  the standard of medical care.

## 2020-06-18 ENCOUNTER — Ambulatory Visit: Payer: Medicare Other | Admitting: Rheumatology

## 2020-06-18 DIAGNOSIS — M62838 Other muscle spasm: Secondary | ICD-10-CM

## 2020-06-18 DIAGNOSIS — Z8719 Personal history of other diseases of the digestive system: Secondary | ICD-10-CM

## 2020-06-18 DIAGNOSIS — M7551 Bursitis of right shoulder: Secondary | ICD-10-CM

## 2020-06-18 DIAGNOSIS — Z8679 Personal history of other diseases of the circulatory system: Secondary | ICD-10-CM

## 2020-06-18 DIAGNOSIS — Z96653 Presence of artificial knee joint, bilateral: Secondary | ICD-10-CM

## 2020-06-18 DIAGNOSIS — Z79899 Other long term (current) drug therapy: Secondary | ICD-10-CM

## 2020-06-18 DIAGNOSIS — M810 Age-related osteoporosis without current pathological fracture: Secondary | ICD-10-CM

## 2020-06-18 DIAGNOSIS — M0579 Rheumatoid arthritis with rheumatoid factor of multiple sites without organ or systems involvement: Secondary | ICD-10-CM

## 2020-06-18 DIAGNOSIS — Z8639 Personal history of other endocrine, nutritional and metabolic disease: Secondary | ICD-10-CM

## 2020-06-23 DIAGNOSIS — R7303 Prediabetes: Secondary | ICD-10-CM | POA: Diagnosis not present

## 2020-06-23 DIAGNOSIS — M069 Rheumatoid arthritis, unspecified: Secondary | ICD-10-CM | POA: Diagnosis not present

## 2020-06-23 DIAGNOSIS — E876 Hypokalemia: Secondary | ICD-10-CM | POA: Diagnosis not present

## 2020-06-23 DIAGNOSIS — N1831 Chronic kidney disease, stage 3a: Secondary | ICD-10-CM | POA: Diagnosis not present

## 2020-06-30 ENCOUNTER — Ambulatory Visit (INDEPENDENT_AMBULATORY_CARE_PROVIDER_SITE_OTHER): Payer: Medicare Other | Admitting: Rheumatology

## 2020-06-30 ENCOUNTER — Other Ambulatory Visit: Payer: Self-pay

## 2020-06-30 ENCOUNTER — Telehealth: Payer: Self-pay

## 2020-06-30 ENCOUNTER — Encounter: Payer: Self-pay | Admitting: Rheumatology

## 2020-06-30 VITALS — BP 116/74 | HR 61 | Ht 63.0 in | Wt 166.8 lb

## 2020-06-30 DIAGNOSIS — M7061 Trochanteric bursitis, right hip: Secondary | ICD-10-CM

## 2020-06-30 DIAGNOSIS — Z7189 Other specified counseling: Secondary | ICD-10-CM

## 2020-06-30 DIAGNOSIS — Z8719 Personal history of other diseases of the digestive system: Secondary | ICD-10-CM

## 2020-06-30 DIAGNOSIS — M810 Age-related osteoporosis without current pathological fracture: Secondary | ICD-10-CM

## 2020-06-30 DIAGNOSIS — Z79899 Other long term (current) drug therapy: Secondary | ICD-10-CM | POA: Diagnosis not present

## 2020-06-30 DIAGNOSIS — M0579 Rheumatoid arthritis with rheumatoid factor of multiple sites without organ or systems involvement: Secondary | ICD-10-CM

## 2020-06-30 DIAGNOSIS — Z8639 Personal history of other endocrine, nutritional and metabolic disease: Secondary | ICD-10-CM

## 2020-06-30 DIAGNOSIS — Z8679 Personal history of other diseases of the circulatory system: Secondary | ICD-10-CM | POA: Diagnosis not present

## 2020-06-30 DIAGNOSIS — M7551 Bursitis of right shoulder: Secondary | ICD-10-CM | POA: Diagnosis not present

## 2020-06-30 DIAGNOSIS — Z96653 Presence of artificial knee joint, bilateral: Secondary | ICD-10-CM | POA: Diagnosis not present

## 2020-06-30 DIAGNOSIS — M62838 Other muscle spasm: Secondary | ICD-10-CM

## 2020-06-30 NOTE — Patient Instructions (Addendum)
Standing Labs We placed an order today for your standing lab work.   Please have your standing labs drawn in January and every 3 months  If possible, please have your labs drawn 2 weeks prior to your appointment so that the provider can discuss your results at your appointment.  We have open lab daily Monday through Thursday from 8:30-12:30 PM and 1:30-4:30 PM and Friday from 8:30-12:30 PM and 1:30-4:00 PM at the office of Dr. Bo Merino, [facility].   Please be advised, patients with office appointments requiring lab work will take precedents over walk-in lab work.  If possible, please come for your lab work on Monday and Friday afternoons, as you may experience shorter wait times. The office is located at [address], [place], [place], [place] [address] No appointment is necessary.   Labs are drawn by [facility]. Please bring your co-pay at the time of your lab draw.  You may receive a bill from [facility] for your lab work.  If you wish to have your labs drawn at another location, please call the office 24 hours in advance to send orders.  If you have any questions regarding directions or hours of operation,  please call [phone].   As a reminder, please drink plenty of water prior to coming for your lab work. Thanks!  COVID-19 vaccine recommendations:   COVID-19 vaccine is recommended for everyone (unless you are allergic to a vaccine component), even if you are on a medication that suppresses your immune system.   Do not take Tylenol or any anti-inflammatory medications (NSAIDs) 24 hours prior to the COVID-19 vaccination.   There is no direct evidence about the efficacy of the COVID-19 vaccine in individuals who are on medications that suppress the immune system.   Even if you are fully vaccinated, and you are on any medications that suppress your immune system, please continue to wear a mask, maintain at least six feet social distance and practice hand hygiene.    If you develop a COVID-19 infection, please contact your PCP or our office to determine if you need monoclonal antibody infusion.  The booster vaccine is now available for immunocompromised patients.   Please see the following web sites for updated information.   [email or web address]    Iliotibial Band Syndrome Rehab Ask your health care provider which exercises are safe for you. Do exercises exactly as told by your health care provider and adjust them as directed. It is normal to feel mild stretching, pulling, tightness, or discomfort as you do these exercises. Stop right away if you feel sudden pain or your pain gets significantly worse. Do not begin these exercises until told by your health care provider. Stretching and range-of-motion exercises These exercises warm up your muscles and joints and improve the movement and flexibility of your hip and pelvis. Quadriceps stretch, prone  1. Lie on your abdomen on a firm surface, such as a bed or padded floor (prone position). 2. Bend your left / right knee and reach back to hold your ankle or pant leg. If you cannot reach your ankle or pant leg, loop a belt around your foot and grab the belt instead. 3. Gently pull your heel toward your buttocks. Your knee should not slide out to the side. You should feel a stretch in the front of your thigh and knee (quadriceps). 4. Hold this position for __________ seconds. Repeat __________ times. Complete this exercise __________ times a day. Iliotibial band stretch An iliotibial band is a  strong band of muscle tissue that runs from the outer side of your hip to the outer side of your thigh and knee. 1. Lie on your side with your left / right leg in the top position. 2. Bend both of your knees and grab your left / right ankle. Stretch out your bottom arm to help you balance. 3. Slowly bring your top knee back so your thigh goes behind  your trunk. 4. Slowly lower your top leg toward the floor until you feel a gentle stretch on the outside of your left / right hip and thigh. If you do not feel a stretch and your knee will not fall farther, place the heel of your other foot on top of your knee and pull your knee down toward the floor with your foot. 5. Hold this position for __________ seconds. Repeat __________ times. Complete this exercise __________ times a day. Strengthening exercises These exercises build strength and endurance in your hip and pelvis. Endurance is the ability to use your muscles for a long time, even after they get tired. Straight leg raises, side-lying This exercise strengthens the muscles that rotate the leg at the hip and move it away from your body (hip abductors). 1. Lie on your side with your left / right leg in the top position. Lie so your head, shoulder, hip, and knee line up. You may bend your bottom knee to help you balance. 2. Roll your hips slightly forward so your hips are stacked directly over each other and your left / right knee is facing forward. 3. Tense the muscles in your outer thigh and lift your top leg 4-6 inches (10-15 cm). 4. Hold this position for __________ seconds. 5. Slowly return to the starting position. Let your muscles relax completely before doing another repetition. Repeat __________ times. Complete this exercise __________ times a day. Leg raises, prone This exercise strengthens the muscles that move the hips (hip extensors). 1. Lie on your abdomen on your bed or a firm surface. You can put a pillow under your hips if that is more comfortable for your lower back. 2. Bend your left / right knee so your foot is straight up in the air. 3. Squeeze your buttocks muscles and lift your left / right thigh off the bed. Do not let your back arch. 4. Tense your thigh muscle as hard as you can without increasing any knee pain. 5. Hold this position for __________ seconds. 6. Slowly  lower your leg to the starting position and allow it to relax completely. Repeat __________ times. Complete this exercise __________ times a day. Hip hike 1. Stand sideways on a bottom step. Stand on your left / right leg with your other foot unsupported next to the step. You can hold on to the railing or wall for balance if needed. 2. Keep your knees straight and your torso square. Then lift your left / right hip up toward the ceiling. 3. Slowly let your left / right hip lower toward the floor, past the starting position. Your foot should get closer to the floor. Do not lean or bend your knees. Repeat __________ times. Complete this exercise __________ times a day. This information is not intended to replace advice given to you by your health care provider. Make sure you discuss any questions you have with your health care provider. Document Revised: 11/30/2018 Document Reviewed: 05/31/2018 Elsevier Patient Education  [place].

## 2020-06-30 NOTE — Telephone Encounter (Signed)
Please place reclast infusion orders pending lab results.   Last reclast: 06/29/2019  Patient will come back next week for labs after drinking plenty of fluids, per Dr. Estanislado Pandy.

## 2020-07-04 DIAGNOSIS — R7303 Prediabetes: Secondary | ICD-10-CM | POA: Diagnosis not present

## 2020-07-04 DIAGNOSIS — E785 Hyperlipidemia, unspecified: Secondary | ICD-10-CM | POA: Diagnosis not present

## 2020-07-07 ENCOUNTER — Other Ambulatory Visit: Payer: Self-pay | Admitting: *Deleted

## 2020-07-07 ENCOUNTER — Telehealth: Payer: Self-pay | Admitting: Pharmacist

## 2020-07-07 DIAGNOSIS — M810 Age-related osteoporosis without current pathological fracture: Secondary | ICD-10-CM | POA: Diagnosis not present

## 2020-07-07 NOTE — Telephone Encounter (Signed)
Patient signed and completedEnbrel renewal application(maintenance dose of Enbrel 50mg  once weekly).Faxed applicationwithprescription to [employer].  Faxed to [facility] at [place] phone: [phone]  Knox Saliva, PharmD, MPH Clinical Pharmacist (Rheumatology and Pulmonology)

## 2020-07-08 ENCOUNTER — Other Ambulatory Visit: Payer: Self-pay | Admitting: Physician Assistant

## 2020-07-08 DIAGNOSIS — M810 Age-related osteoporosis without current pathological fracture: Secondary | ICD-10-CM

## 2020-07-08 LAB — COMPLETE METABOLIC PANEL WITH GFR
AG Ratio: 1 (calc) (ref 1.0–2.5)
ALT: 7 U/L (ref 6–29)
AST: 15 U/L (ref 10–35)
Albumin: 3.5 g/dL — ABNORMAL LOW (ref 3.6–5.1)
Alkaline phosphatase (APISO): 59 U/L (ref 37–153)
BUN/Creatinine Ratio: 15 (calc) (ref 6–22)
BUN: 15 mg/dL (ref 7–25)
CO2: 32 mmol/L (ref 20–32)
Calcium: 9.5 mg/dL (ref 8.6–10.4)
Chloride: 101 mmol/L (ref 98–110)
Creat: 1.01 mg/dL — ABNORMAL HIGH (ref 0.60–0.88)
GFR, Est African American: 61 mL/min/{1.73_m2} (ref >=60)
GFR, Est Non African American: 53 mL/min/{1.73_m2} — ABNORMAL LOW (ref >=60)
Globulin: 3.4 g/dL (calc) (ref 1.9–3.7)
Glucose, Bld: 77 mg/dL (ref 65–99)
Potassium: 3.7 mmol/L (ref 3.5–5.3)
Sodium: 139 mmol/L (ref 135–146)
Total Bilirubin: 0.5 mg/dL (ref 0.2–1.2)
Total Protein: 6.9 g/dL (ref 6.1–8.1)

## 2020-07-08 LAB — VITAMIN D 25 HYDROXY (VIT D DEFICIENCY, FRACTURES): Vit D, 25-Hydroxy: 60 ng/mL (ref 30–100)

## 2020-07-08 NOTE — Progress Notes (Signed)
Orders for reclast placed today and reviewed with Dr. Estanislado Pandy prior to signing.

## 2020-07-08 NOTE — Progress Notes (Signed)
Next infusion scheduled for reclast and due for updated orders.  Last Visit: 06/30/20 Next Visit: 11/24/20 Labs: 07/07/20: Creatinine 1.01 with GFR 53. Calcium 9.5-WNL Rest of CMP WNL. Vitamin D was 60 on 07/07/20.  She is taking vitamin D 2,000 units by mouth daily.  Advised to continue maintenance dose of vitamin D.   Previously complete forteo-2 years and has had 3 reclast infusions in the past. Last reclast  was on 06/29/19.    Orders placed for Reclast x 1 dose along with premedication of Tylenol and Benadryl.    Orders reviewed with Dr. Estanislado Pandy prior to signing order set.   Hazel Sams, PA-C

## 2020-07-11 DIAGNOSIS — I129 Hypertensive chronic kidney disease with stage 1 through stage 4 chronic kidney disease, or unspecified chronic kidney disease: Secondary | ICD-10-CM | POA: Diagnosis not present

## 2020-07-11 DIAGNOSIS — N1831 Chronic kidney disease, stage 3a: Secondary | ICD-10-CM | POA: Diagnosis not present

## 2020-07-11 DIAGNOSIS — R7303 Prediabetes: Secondary | ICD-10-CM | POA: Diagnosis not present

## 2020-07-11 DIAGNOSIS — N183 Chronic kidney disease, stage 3 unspecified: Secondary | ICD-10-CM | POA: Diagnosis not present

## 2020-07-21 NOTE — Telephone Encounter (Signed)
Received notification from [employer] that application is pending due to missing Proof of Income. Called Ms. Bansal today, and she's confirmed that she has already mailed her most recent tax return directly to [place].  Knox Saliva, PharmD, MPH Clinical Pharmacist (Rheumatology and Pulmonology)

## 2020-07-23 DIAGNOSIS — R7303 Prediabetes: Secondary | ICD-10-CM | POA: Diagnosis not present

## 2020-07-23 DIAGNOSIS — N183 Chronic kidney disease, stage 3 unspecified: Secondary | ICD-10-CM | POA: Diagnosis not present

## 2020-07-23 DIAGNOSIS — I129 Hypertensive chronic kidney disease with stage 1 through stage 4 chronic kidney disease, or unspecified chronic kidney disease: Secondary | ICD-10-CM | POA: Diagnosis not present

## 2020-08-05 ENCOUNTER — Other Ambulatory Visit: Payer: Self-pay

## 2020-08-05 ENCOUNTER — Ambulatory Visit (HOSPITAL_COMMUNITY)
Admission: RE | Admit: 2020-08-05 | Discharge: 2020-08-05 | Disposition: A | Discharge status: Home / Self Care | Payer: Medicare Other | Source: Ambulatory Visit | Attending: Rheumatology | Admitting: Rheumatology

## 2020-08-05 DIAGNOSIS — M810 Age-related osteoporosis without current pathological fracture: Secondary | ICD-10-CM | POA: Diagnosis not present

## 2020-08-05 MED ORDER — DIPHENHYDRAMINE HCL 25 MG PO CAPS: 25.0000 mg | ORAL_CAPSULE | Freq: Once | ORAL | Status: DC

## 2020-08-05 MED ORDER — ACETAMINOPHEN 325 MG PO TABS
ORAL_TABLET | ORAL | Status: AC
  Filled 2020-08-05: qty 2

## 2020-08-05 MED ORDER — ACETAMINOPHEN 325 MG PO TABS
650.0000 mg | ORAL_TABLET | Freq: Once | ORAL | Status: AC
  Administered 2020-08-05: 10:00:00 650 mg via ORAL

## 2020-08-05 MED ORDER — ZOLEDRONIC ACID 5 MG/100ML IV SOLN
INTRAVENOUS | Status: AC
  Administered 2020-08-05: 10:00:00 5 mg via INTRAVENOUS
  Filled 2020-08-05: qty 100

## 2020-08-05 MED ORDER — ZOLEDRONIC ACID 5 MG/100ML IV SOLN: 5.0000 mg | Freq: Once | INTRAVENOUS | Status: AC

## 2020-08-27 NOTE — Telephone Encounter (Signed)
Received notification from [facility] regarding a prior authorization for ENBREL. Authorization has been APPROVED through 08/22/21   Will fax to [employer] and send letter to scan center.  Phone # [phone]

## 2020-09-09 NOTE — Telephone Encounter (Signed)
Received fax from [facility] regarding Enbrel PAP renewal. Status is pending d/t missing PA approval. Re-faxed PA approval letter from [facility] to [place].  Fax: [phone] Phone: [phone]  Knox Saliva, PharmD, MPH Clinical Pharmacist (Rheumatology and Pulmonology)

## 2020-09-10 NOTE — Telephone Encounter (Signed)
Received a fax from  [employer] regarding an approval for ENBREL patient assistance from 09/09/20 to 08/22/21.   Will send approval letter to scan center.  Phone number: [phone]

## 2020-09-17 ENCOUNTER — Other Ambulatory Visit: Payer: Self-pay | Admitting: *Deleted

## 2020-09-17 ENCOUNTER — Other Ambulatory Visit: Payer: Self-pay | Admitting: Physician Assistant

## 2020-09-17 DIAGNOSIS — Z79899 Other long term (current) drug therapy: Secondary | ICD-10-CM

## 2020-09-18 ENCOUNTER — Telehealth: Payer: Self-pay

## 2020-09-18 LAB — CBC WITH DIFFERENTIAL/PLATELET
Absolute Monocytes: 541 cells/uL (ref 200–950)
Basophils Absolute: 31 cells/uL (ref 0–200)
Basophils Relative: 0.6 %
Eosinophils Absolute: 421 cells/uL (ref 15–500)
Eosinophils Relative: 8.1 %
HCT: 34.3 % — ABNORMAL LOW (ref 35.0–45.0)
Hemoglobin: 11.8 g/dL (ref 11.7–15.5)
Lymphs Abs: 2298 cells/uL (ref 850–3900)
MCH: 31.3 pg (ref 27.0–33.0)
MCHC: 34.4 g/dL (ref 32.0–36.0)
MCV: 91 fL (ref 80.0–100.0)
MPV: 9.7 fL (ref 7.5–12.5)
Monocytes Relative: 10.4 %
Neutro Abs: 1908 cells/uL (ref 1500–7800)
Neutrophils Relative %: 36.7 %
Platelets: 197 10*3/uL (ref 140–400)
RBC: 3.77 10*6/uL — ABNORMAL LOW (ref 3.80–5.10)
RDW: 12.2 % (ref 11.0–15.0)
Total Lymphocyte: 44.2 %
WBC: 5.2 10*3/uL (ref 3.8–10.8)

## 2020-09-18 LAB — COMPLETE METABOLIC PANEL WITH GFR
AG Ratio: 1.2 (calc) (ref 1.0–2.5)
ALT: 10 U/L (ref 6–29)
AST: 17 U/L (ref 10–35)
Albumin: 3.8 g/dL (ref 3.6–5.1)
Alkaline phosphatase (APISO): 54 U/L (ref 37–153)
BUN/Creatinine Ratio: 12 (calc) (ref 6–22)
BUN: 19 mg/dL (ref 7–25)
CO2: 33 mmol/L — ABNORMAL HIGH (ref 20–32)
Calcium: 9.3 mg/dL (ref 8.6–10.4)
Chloride: 104 mmol/L (ref 98–110)
Creat: 1.53 mg/dL — ABNORMAL HIGH (ref 0.60–0.88)
GFR, Est African American: 37 mL/min/{1.73_m2} — ABNORMAL LOW (ref >=60)
GFR, Est Non African American: 32 mL/min/{1.73_m2} — ABNORMAL LOW (ref >=60)
Globulin: 3.2 g/dL (calc) (ref 1.9–3.7)
Glucose, Bld: 83 mg/dL (ref 65–99)
Potassium: 3.7 mmol/L (ref 3.5–5.3)
Sodium: 141 mmol/L (ref 135–146)
Total Bilirubin: 0.4 mg/dL (ref 0.2–1.2)
Total Protein: 7 g/dL (ref 6.1–8.1)

## 2020-09-18 NOTE — Telephone Encounter (Signed)
Patient states she received a call from [facility] last week letting her know there was an update on her application.  Patient states she has tried to call back, but has been unable to reach them.

## 2020-09-18 NOTE — Progress Notes (Signed)
Creatinine is elevated-1.53 and GFR remains low and has trended down to 32.  Please clarify if the patient has been taking any NSAIDs.  She should avoid all NSAIDs.   Please clarify if she sees a nephrologist as well.   Please advise the patient to have BMP rechecked in 1 month.   RBC count and hematocrit are mildly low.  We will continue to monitor.

## 2020-09-18 NOTE — Telephone Encounter (Signed)
Returned patient's call, left a message.   Office received fax that she was approved for [employer] patient assistance, advised her to call them to schedule her next refill and to contact office if she has any issues.

## 2020-09-23 DIAGNOSIS — R7303 Prediabetes: Secondary | ICD-10-CM | POA: Diagnosis not present

## 2020-09-23 DIAGNOSIS — N183 Chronic kidney disease, stage 3 unspecified: Secondary | ICD-10-CM | POA: Diagnosis not present

## 2020-09-23 DIAGNOSIS — E785 Hyperlipidemia, unspecified: Secondary | ICD-10-CM | POA: Diagnosis not present

## 2020-09-23 DIAGNOSIS — I129 Hypertensive chronic kidney disease with stage 1 through stage 4 chronic kidney disease, or unspecified chronic kidney disease: Secondary | ICD-10-CM | POA: Diagnosis not present

## 2020-10-10 ENCOUNTER — Other Ambulatory Visit: Payer: Self-pay | Admitting: Pharmacy Technician

## 2020-10-10 DIAGNOSIS — M0579 Rheumatoid arthritis with rheumatoid factor of multiple sites without organ or systems involvement: Secondary | ICD-10-CM

## 2020-10-10 MED ORDER — ENBREL SURECLICK 50 MG/ML ~~LOC~~ SOAJ: 50.0000 mg | SUBCUTANEOUS | 0 refills | Status: DC

## 2020-10-10 NOTE — Telephone Encounter (Signed)
Received Fax refill request from [facility] for patient's Enbrel. New rx needed for patient's upcoming scheduled delivery on 10/14/20.   Routing to clinical staff.

## 2020-10-10 NOTE — Telephone Encounter (Signed)
Last Visit: 06/30/2020 Next Visit: 11/24/2020 Labs: 09/17/2020 Creatinine is elevated-1.53 and GFR remains low and has trended down to 32. RBC count and hematocrit are mildly low.  TB Gold: 05/29/2020 Neg   Current Dose per office note 09/27/971: Enbrel Sureclick 50 mg sq every 7 days  DX: Seropositive rheumatoid arthritis of multiple sites   Last Fill: 05/30/2020  Okay to refill Enbrel?

## 2020-10-21 DIAGNOSIS — R7303 Prediabetes: Secondary | ICD-10-CM | POA: Diagnosis not present

## 2020-10-21 DIAGNOSIS — I129 Hypertensive chronic kidney disease with stage 1 through stage 4 chronic kidney disease, or unspecified chronic kidney disease: Secondary | ICD-10-CM | POA: Diagnosis not present

## 2020-10-21 DIAGNOSIS — N183 Chronic kidney disease, stage 3 unspecified: Secondary | ICD-10-CM | POA: Diagnosis not present

## 2020-10-21 DIAGNOSIS — E785 Hyperlipidemia, unspecified: Secondary | ICD-10-CM | POA: Diagnosis not present

## 2020-11-10 NOTE — Progress Notes (Deleted)
Office Visit Note  Patient: Andrea [name]             Date of Birth: [birth year]           MRN: [id number]             PCP: Marco Collie, MD Referring: Marco Collie, MD Visit Date: 11/24/2020 Occupation: @GUAROCC @  Subjective:  No chief complaint on file.   History of Present Illness: Andrea [name] is a 80 y.o. female ***   Activities of Daily Living:  Patient reports morning stiffness for *** {minute/hour:19697}.   Patient {ACTIONS;DENIES/REPORTS:21021675::"Denies"} nocturnal pain.  Difficulty dressing/grooming: {ACTIONS;DENIES/REPORTS:21021675::"Denies"} Difficulty climbing stairs: {ACTIONS;DENIES/REPORTS:21021675::"Denies"} Difficulty getting out of chair: {ACTIONS;DENIES/REPORTS:21021675::"Denies"} Difficulty using hands for taps, buttons, cutlery, and/or writing: {ACTIONS;DENIES/REPORTS:21021675::"Denies"}  No Rheumatology ROS completed.   PMFS History:  Patient Active Problem List   Diagnosis Date Noted  . History of hypertension 02/08/2017  . History of gastroesophageal reflux (GERD) 02/08/2017  . High risk medication use 12/27/2016  . Vitamin D deficiency 12/27/2016  . History of total knee replacement, bilateral 08/27/2016  . Age-related osteoporosis without current pathological fracture 08/27/2016  . Seropositive rheumatoid arthritis of multiple sites ([place]) 06/21/2016    Past Medical History:  Diagnosis Date  . Arthritis   . Cataract   . GERD (gastroesophageal reflux disease)   . History of colon polyps   . Hypercholesterolemia   . Hypertension   . Osteoporosis     Family History  Problem Relation Age of Onset  . Kidney disease Mother   . Cancer Father   . Stroke Brother   . Lung disease Brother   . Rheum arthritis Daughter   . Rheum arthritis Daughter   . Colon cancer Neg Hx   . Esophageal cancer Neg Hx   . Rectal cancer Neg Hx   . Stomach cancer Neg Hx    Past Surgical History:  Procedure Laterality Date  . CARPAL TUNNEL RELEASE    .  COLONOSCOPY  12/23/2010   Mild pancolonic diverticulosis. Small internal hemorroids  . COLONOSCOPY  08/2019  . ESOPHAGOGASTRODUODENOSCOPY  05/09/2013   Hiatal Hernia. Presbyesophagus. Minimal antral gastritis.  Marland Kitchen JOINT REPLACEMENT     BIL knee   . KNEE ARTHROPLASTY     Right knee 2012, Left knee total 2009  . TUBAL LIGATION Bilateral   . UPPER GI ENDOSCOPY  08/2019   Social History   Social History Narrative  . Not on file   Immunization History  Administered Date(s) Administered  . Moderna Sars-Covid-2 Vaccination 09/14/2019, 10/12/2019     Objective: Vital Signs: There were no vitals taken for this visit.   Physical Exam   Musculoskeletal Exam: ***  CDAI Exam: CDAI Score: -- Patient Global: --; Provider Global: -- Swollen: --; Tender: -- Joint Exam 11/24/2020   No joint exam has been documented for this visit   There is currently no information documented on the homunculus. Go to the Rheumatology activity and complete the homunculus joint exam.  Investigation: No additional findings.  Imaging: No results found.  Recent Labs: Lab Results  Component Value Date   WBC 5.2 09/17/2020   HGB 11.8 09/17/2020   PLT 197 09/17/2020   NA 141 09/17/2020   K 3.7 09/17/2020   CL 104 09/17/2020   CO2 33 (H) 09/17/2020   GLUCOSE 83 09/17/2020   BUN 19 09/17/2020   CREATININE 1.53 (H) 09/17/2020   BILITOT 0.4 09/17/2020   ALKPHOS 48 03/25/2017   AST 17 09/17/2020   ALT  10 09/17/2020   PROT 7.0 09/17/2020   ALBUMIN 3.8 03/25/2017   CALCIUM 9.3 09/17/2020   GFRAA 37 (L) 09/17/2020   QFTBGOLDPLUS NEGATIVE 05/29/2020    Speciality Comments: TB Gold: 03/25/17 Neg  We may have to discontinue Reclast based on her future GFR.  Procedures:  No procedures performed Allergies: Patient has no known allergies.   Assessment / Plan:     Visit Diagnoses: Seropositive rheumatoid arthritis of multiple sites ([place])  High risk medication use  Subdeltoid bursitis of right  shoulder joint  History of total knee replacement, bilateral  Age-related osteoporosis without current pathological fracture  History of vitamin D deficiency  History of hypertension  History of gastroesophageal reflux (GERD)  Trochanteric bursitis, right hip  Orders: No orders of the defined types were placed in this encounter.  No orders of the defined types were placed in this encounter.   Face-to-face time spent with patient was *** minutes. Greater than 50% of time was spent in counseling and coordination of care.  Follow-Up Instructions: No follow-ups on file.   Ofilia Neas, PA-C  Note - This record has been created using Dragon software.  Chart creation errors have been sought, but may not always  have been located. Such creation errors do not reflect on  the standard of medical care.

## 2020-11-21 DIAGNOSIS — M069 Rheumatoid arthritis, unspecified: Secondary | ICD-10-CM | POA: Diagnosis not present

## 2020-11-21 DIAGNOSIS — I129 Hypertensive chronic kidney disease with stage 1 through stage 4 chronic kidney disease, or unspecified chronic kidney disease: Secondary | ICD-10-CM | POA: Diagnosis not present

## 2020-11-21 DIAGNOSIS — E785 Hyperlipidemia, unspecified: Secondary | ICD-10-CM | POA: Diagnosis not present

## 2020-11-24 ENCOUNTER — Ambulatory Visit: Payer: Medicare Other | Admitting: Physician Assistant

## 2020-11-24 DIAGNOSIS — Z79899 Other long term (current) drug therapy: Secondary | ICD-10-CM

## 2020-11-24 DIAGNOSIS — M7551 Bursitis of right shoulder: Secondary | ICD-10-CM

## 2020-11-24 DIAGNOSIS — Z8719 Personal history of other diseases of the digestive system: Secondary | ICD-10-CM

## 2020-11-24 DIAGNOSIS — Z96653 Presence of artificial knee joint, bilateral: Secondary | ICD-10-CM

## 2020-11-24 DIAGNOSIS — M810 Age-related osteoporosis without current pathological fracture: Secondary | ICD-10-CM

## 2020-11-24 DIAGNOSIS — M7061 Trochanteric bursitis, right hip: Secondary | ICD-10-CM

## 2020-11-24 DIAGNOSIS — Z8679 Personal history of other diseases of the circulatory system: Secondary | ICD-10-CM

## 2020-11-24 DIAGNOSIS — M0579 Rheumatoid arthritis with rheumatoid factor of multiple sites without organ or systems involvement: Secondary | ICD-10-CM

## 2020-11-24 DIAGNOSIS — Z8639 Personal history of other endocrine, nutritional and metabolic disease: Secondary | ICD-10-CM

## 2020-12-05 DIAGNOSIS — E785 Hyperlipidemia, unspecified: Secondary | ICD-10-CM | POA: Diagnosis not present

## 2020-12-05 DIAGNOSIS — R7303 Prediabetes: Secondary | ICD-10-CM | POA: Diagnosis not present

## 2020-12-12 DIAGNOSIS — I129 Hypertensive chronic kidney disease with stage 1 through stage 4 chronic kidney disease, or unspecified chronic kidney disease: Secondary | ICD-10-CM | POA: Diagnosis not present

## 2020-12-12 DIAGNOSIS — E785 Hyperlipidemia, unspecified: Secondary | ICD-10-CM | POA: Diagnosis not present

## 2020-12-12 DIAGNOSIS — R7303 Prediabetes: Secondary | ICD-10-CM | POA: Diagnosis not present

## 2020-12-12 DIAGNOSIS — N183 Chronic kidney disease, stage 3 unspecified: Secondary | ICD-10-CM | POA: Diagnosis not present

## 2020-12-21 DIAGNOSIS — I129 Hypertensive chronic kidney disease with stage 1 through stage 4 chronic kidney disease, or unspecified chronic kidney disease: Secondary | ICD-10-CM | POA: Diagnosis not present

## 2020-12-21 DIAGNOSIS — E785 Hyperlipidemia, unspecified: Secondary | ICD-10-CM | POA: Diagnosis not present

## 2020-12-21 DIAGNOSIS — M069 Rheumatoid arthritis, unspecified: Secondary | ICD-10-CM | POA: Diagnosis not present

## 2021-01-21 DIAGNOSIS — E785 Hyperlipidemia, unspecified: Secondary | ICD-10-CM | POA: Diagnosis not present

## 2021-01-21 DIAGNOSIS — M069 Rheumatoid arthritis, unspecified: Secondary | ICD-10-CM | POA: Diagnosis not present

## 2021-01-21 DIAGNOSIS — I129 Hypertensive chronic kidney disease with stage 1 through stage 4 chronic kidney disease, or unspecified chronic kidney disease: Secondary | ICD-10-CM | POA: Diagnosis not present

## 2021-01-30 DIAGNOSIS — Z20822 Contact with and (suspected) exposure to covid-19: Secondary | ICD-10-CM | POA: Diagnosis not present

## 2021-02-20 DIAGNOSIS — I129 Hypertensive chronic kidney disease with stage 1 through stage 4 chronic kidney disease, or unspecified chronic kidney disease: Secondary | ICD-10-CM | POA: Diagnosis not present

## 2021-02-20 DIAGNOSIS — E785 Hyperlipidemia, unspecified: Secondary | ICD-10-CM | POA: Diagnosis not present

## 2021-02-20 DIAGNOSIS — M069 Rheumatoid arthritis, unspecified: Secondary | ICD-10-CM | POA: Diagnosis not present

## 2021-03-23 DIAGNOSIS — E785 Hyperlipidemia, unspecified: Secondary | ICD-10-CM | POA: Diagnosis not present

## 2021-03-23 DIAGNOSIS — M069 Rheumatoid arthritis, unspecified: Secondary | ICD-10-CM | POA: Diagnosis not present

## 2021-03-23 DIAGNOSIS — I129 Hypertensive chronic kidney disease with stage 1 through stage 4 chronic kidney disease, or unspecified chronic kidney disease: Secondary | ICD-10-CM | POA: Diagnosis not present

## 2021-03-31 ENCOUNTER — Other Ambulatory Visit: Payer: Self-pay | Admitting: Rheumatology

## 2021-03-31 ENCOUNTER — Other Ambulatory Visit: Payer: Self-pay | Admitting: *Deleted

## 2021-03-31 DIAGNOSIS — Z79899 Other long term (current) drug therapy: Secondary | ICD-10-CM

## 2021-03-31 DIAGNOSIS — R928 Other abnormal and inconclusive findings on diagnostic imaging of breast: Secondary | ICD-10-CM | POA: Diagnosis not present

## 2021-03-31 DIAGNOSIS — Z9225 Personal history of immunosupression therapy: Secondary | ICD-10-CM

## 2021-03-31 DIAGNOSIS — R921 Mammographic calcification found on diagnostic imaging of breast: Secondary | ICD-10-CM | POA: Diagnosis not present

## 2021-03-31 DIAGNOSIS — Z111 Encounter for screening for respiratory tuberculosis: Secondary | ICD-10-CM

## 2021-03-31 DIAGNOSIS — M0579 Rheumatoid arthritis with rheumatoid factor of multiple sites without organ or systems involvement: Secondary | ICD-10-CM

## 2021-03-31 LAB — COMPLETE METABOLIC PANEL WITH GFR
AG Ratio: 1.1 (calc) (ref 1.0–2.5)
ALT: 7 U/L (ref 6–29)
AST: 17 U/L (ref 10–35)
Albumin: 3.8 g/dL (ref 3.6–5.1)
Alkaline phosphatase (APISO): 48 U/L (ref 37–153)
BUN/Creatinine Ratio: 18 (calc) (ref 6–22)
BUN: 17 mg/dL (ref 7–25)
CO2: 29 mmol/L (ref 20–32)
Calcium: 9.7 mg/dL (ref 8.6–10.4)
Chloride: 102 mmol/L (ref 98–110)
Creat: 0.96 mg/dL — ABNORMAL HIGH (ref 0.60–0.95)
Globulin: 3.6 g/dL (calc) (ref 1.9–3.7)
Glucose, Bld: 82 mg/dL (ref 65–99)
Potassium: 3.6 mmol/L (ref 3.5–5.3)
Sodium: 139 mmol/L (ref 135–146)
Total Bilirubin: 0.5 mg/dL (ref 0.2–1.2)
Total Protein: 7.4 g/dL (ref 6.1–8.1)
eGFR: 59 mL/min/{1.73_m2} — ABNORMAL LOW (ref >=60)

## 2021-03-31 LAB — CBC WITH DIFFERENTIAL/PLATELET
Absolute Monocytes: 377 cells/uL (ref 200–950)
Basophils Absolute: 32 cells/uL (ref 0–200)
Basophils Relative: 0.7 %
Eosinophils Absolute: 589 cells/uL — ABNORMAL HIGH (ref 15–500)
Eosinophils Relative: 12.8 %
HCT: 34.5 % — ABNORMAL LOW (ref 35.0–45.0)
Hemoglobin: 11.4 g/dL — ABNORMAL LOW (ref 11.7–15.5)
Lymphs Abs: 1904 cells/uL (ref 850–3900)
MCH: 31.3 pg (ref 27.0–33.0)
MCHC: 33 g/dL (ref 32.0–36.0)
MCV: 94.8 fL (ref 80.0–100.0)
MPV: 9.7 fL (ref 7.5–12.5)
Monocytes Relative: 8.2 %
Neutro Abs: 1697 cells/uL (ref 1500–7800)
Neutrophils Relative %: 36.9 %
Platelets: 207 10*3/uL (ref 140–400)
RBC: 3.64 10*6/uL — ABNORMAL LOW (ref 3.80–5.10)
RDW: 12.4 % (ref 11.0–15.0)
Total Lymphocyte: 41.4 %
WBC: 4.6 10*3/uL (ref 3.8–10.8)

## 2021-03-31 MED ORDER — ENBREL SURECLICK 50 MG/ML ~~LOC~~ SOAJ: 50.0000 mg | SUBCUTANEOUS | 0 refills | Status: DC

## 2021-03-31 NOTE — Telephone Encounter (Signed)
Last Visit: 06/30/2020  Next Visit: was due April 2022. Message sent to the front to schedule patient  Labs: 09/17/2020 Creatinine is elevated-1.53 and GFR remains low and has trended down to 32. RBC count and hematocrit are mildly low.  TB Gold: 05/29/2020 Neg    Current Dose per office note [id number]: Enbrel Sureclick 50 mg sq every 7 days   DX: Seropositive rheumatoid arthritis of multiple sites    Last Fill: 10/10/2020  Patient updated labs today and they are pending.   Okay to refill Enbrel?

## 2021-03-31 NOTE — Telephone Encounter (Signed)
Please schedule patient for a follow up visit. Patient was due April 2022. Thanks!

## 2021-03-31 NOTE — Telephone Encounter (Signed)
I LMOM for patient to call to schedule a follow up appointment she was due for in April.

## 2021-03-31 NOTE — Telephone Encounter (Signed)
Patient request refill on Enbrel sent to mail order pharmacy. Patient had lab draw today with [place].

## 2021-04-01 NOTE — Progress Notes (Signed)
Hemoglobin is low and stable.  Creatinine is mildly elevated and better than previous labs.

## 2021-04-07 ENCOUNTER — Telehealth: Payer: Self-pay

## 2021-04-07 NOTE — Telephone Encounter (Signed)
Patient left a voicemail stating she received her Enbrel today, but only received a 1 month supply.  Patient states she usually gets a 3 month supply and requested a return call.

## 2021-04-08 NOTE — Progress Notes (Deleted)
Office Visit Note  Patient: Andrea [name]             Date of Birth: 20-Jun-1941           MRN: [id number]             PCP: Marco Collie, MD Referring: Marco Collie, MD Visit Date: 04/13/2021 Occupation: '@GUAROCC'$ @  Subjective:  No chief complaint on file.   History of Present Illness: Andrea [name] is a 80 y.o. female ***   Activities of Daily Living:  Patient reports morning stiffness for *** {minute/hour:19697}.   Patient {ACTIONS;DENIES/REPORTS:21021675::"Denies"} nocturnal pain.  Difficulty dressing/grooming: {ACTIONS;DENIES/REPORTS:21021675::"Denies"} Difficulty climbing stairs: {ACTIONS;DENIES/REPORTS:21021675::"Denies"} Difficulty getting out of chair: {ACTIONS;DENIES/REPORTS:21021675::"Denies"} Difficulty using hands for taps, buttons, cutlery, and/or writing: {ACTIONS;DENIES/REPORTS:21021675::"Denies"}  No Rheumatology ROS completed.   PMFS History:  Patient Active Problem List   Diagnosis Date Noted   History of hypertension 02/08/2017   History of gastroesophageal reflux (GERD) 02/08/2017   High risk medication use 12/27/2016   Vitamin D deficiency 12/27/2016   History of total knee replacement, bilateral 08/27/2016   Age-related osteoporosis without current pathological fracture 08/27/2016   Seropositive rheumatoid arthritis of multiple sites ([place]) 06/21/2016    Past Medical History:  Diagnosis Date   Arthritis    Cataract    GERD (gastroesophageal reflux disease)    History of colon polyps    Hypercholesterolemia    Hypertension    Osteoporosis     Family History  Problem Relation Age of Onset   Kidney disease Mother    Cancer Father    Stroke Brother    Lung disease Brother    Rheum arthritis Daughter    Rheum arthritis Daughter    Colon cancer Neg Hx    Esophageal cancer Neg Hx    Rectal cancer Neg Hx    Stomach cancer Neg Hx    Past Surgical History:  Procedure Laterality Date   CARPAL TUNNEL RELEASE     COLONOSCOPY  12/23/2010    Mild pancolonic diverticulosis. Small internal hemorroids   COLONOSCOPY  08/2019   ESOPHAGOGASTRODUODENOSCOPY  05/09/2013   Hiatal Hernia. Presbyesophagus. Minimal antral gastritis.   JOINT REPLACEMENT     BIL knee    KNEE ARTHROPLASTY     Right knee 2012, Left knee total 2009   TUBAL LIGATION Bilateral    UPPER GI ENDOSCOPY  08/2019   Social History   Social History Narrative   Not on file   Immunization History  Administered Date(s) Administered   Moderna Sars-Covid-2 Vaccination 09/14/2019, 10/12/2019     Objective: Vital Signs: There were no vitals taken for this visit.   Physical Exam   Musculoskeletal Exam: ***  CDAI Exam: CDAI Score: -- Patient Global: --; Provider Global: -- Swollen: --; Tender: -- Joint Exam 04/13/2021   No joint exam has been documented for this visit   There is currently no information documented on the homunculus. Go to the Rheumatology activity and complete the homunculus joint exam.  Investigation: No additional findings.  Imaging: No results found.  Recent Labs: Lab Results  Component Value Date   WBC 4.6 03/31/2021   HGB 11.4 (L) 03/31/2021   PLT 207 03/31/2021   NA 139 03/31/2021   K 3.6 03/31/2021   CL 102 03/31/2021   CO2 29 03/31/2021   GLUCOSE 82 03/31/2021   BUN 17 03/31/2021   CREATININE 0.96 (H) 03/31/2021   BILITOT 0.5 03/31/2021   ALKPHOS 48 03/25/2017   AST 17 03/31/2021   ALT  7 03/31/2021   PROT 7.4 03/31/2021   ALBUMIN 3.8 03/25/2017   CALCIUM 9.7 03/31/2021   GFRAA 37 (L) 09/17/2020   QFTBGOLDPLUS NEGATIVE 05/29/2020    Speciality Comments: TB Gold: 03/25/17 Neg  We may have to discontinue Reclast based on her future GFR.  Procedures:  No procedures performed Allergies: Patient has no known allergies.   Assessment / Plan:     Visit Diagnoses: No diagnosis found.  Orders: No orders of the defined types were placed in this encounter.  No orders of the defined types were placed in this  encounter.   Face-to-face time spent with patient was *** minutes. Greater than 50% of time was spent in counseling and coordination of care.  Follow-Up Instructions: No follow-ups on file.   Earnestine Mealing, CMA  Note - This record has been created using Editor, commissioning.  Chart creation errors have been sought, but may not always  have been located. Such creation errors do not reflect on  the standard of medical care.

## 2021-04-08 NOTE — Telephone Encounter (Signed)
Spoke with patient and advised her prescription was only sent for a 30 day supply because at the time of the refill request she was due to update labs and she is due for a follow up visit. Patient has since updated her labs and she is now scheduled for an appointment on 04/13/2021. Patient advised with next refill we will be able to fill a 90 day supply. Patient expressed understanding.

## 2021-04-10 NOTE — Progress Notes (Signed)
Office Visit Note  Patient: Andrea [name]             Date of Birth: [birth year]           MRN: [id number]             PCP: Marco Collie, MD Referring: Marco Collie, MD Visit Date: 04/20/2021 Occupation: '@GUAROCC' @  Subjective:  Medication monitoring   History of Present Illness: Andrea [name] is a 80 y.o. female with history of seropositive rheumatoid arthritis and osteoporosis.  She is on enbrel 50 mg sq injections once weekly.  She denies any recent rheumatoid arthritis flares.  She experiences occasional discomfort in both ankle joints but denies any joint swelling currently.  She has noticed some fluid retention today. She states both knee replacements are doing well.   She was evaluated by her PCP this morning for routine follow-up.  She receives annual influenza vaccine at that appointment.  She has not had a dermatology appointment for a yearly skin exam yet.  She states that earlier this spring she had several surgery.  She held Enbrel around the time of surgery and did not have any infections.  She states that she has had some issues with healing and has not been able to eat solid foods.  She will be following back up with the oral surgeon in September.  Her last Reclast infusion was in December 2021.  She remains on vitamin D 2000 units daily.  She denies any recent falls or fractures. She denies any recent infections.  She denies any other new concerns at this time.    Activities of Daily Living:  Patient reports morning stiffness for 0 minutes.   Patient Denies nocturnal pain.  Difficulty dressing/grooming: Denies Difficulty climbing stairs: Reports Difficulty getting out of chair: Reports Difficulty using hands for taps, buttons, cutlery, and/or writing: Reports  Review of Systems  Constitutional:  Negative for fatigue.  HENT:  Negative for mouth sores, mouth dryness and nose dryness.   Eyes:  Negative for pain, itching and dryness.  Respiratory:  Negative for  shortness of breath and difficulty breathing.   Cardiovascular:  Positive for swelling in legs/feet. Negative for chest pain and palpitations.  Gastrointestinal:  Positive for constipation. Negative for blood in stool and diarrhea.  Endocrine: Negative for increased urination.  Genitourinary:  Negative for difficulty urinating.  Musculoskeletal:  Negative for joint pain, joint pain, joint swelling, myalgias, morning stiffness, muscle tenderness and myalgias.  Skin:  Negative for color change, rash and redness.  Allergic/Immunologic: Negative for susceptible to infections.  Neurological:  Negative for dizziness, numbness, headaches, memory loss and weakness.  Hematological:  Negative for bruising/bleeding tendency.  Psychiatric/Behavioral:  Negative for confusion.    PMFS History:  Patient Active Problem List   Diagnosis Date Noted   History of hypertension 02/08/2017   History of gastroesophageal reflux (GERD) 02/08/2017   High risk medication use 12/27/2016   Vitamin D deficiency 12/27/2016   History of total knee replacement, bilateral 08/27/2016   Age-related osteoporosis without current pathological fracture 08/27/2016   Seropositive rheumatoid arthritis of multiple sites ([place]) 06/21/2016    Past Medical History:  Diagnosis Date   Arthritis    Cataract    GERD (gastroesophageal reflux disease)    History of colon polyps    Hypercholesterolemia    Hypertension    Osteoporosis     Family History  Problem Relation Age of Onset   Kidney disease Mother    Cancer Father  Stroke Brother    Lung disease Brother    Bladder Cancer Daughter    Rheum arthritis Daughter    Rheum arthritis Daughter    Colon cancer Neg Hx    Esophageal cancer Neg Hx    Rectal cancer Neg Hx    Stomach cancer Neg Hx    Past Surgical History:  Procedure Laterality Date   CARPAL TUNNEL RELEASE     COLONOSCOPY  12/23/2010   Mild pancolonic diverticulosis. Small internal hemorroids   COLONOSCOPY   08/2019   ESOPHAGOGASTRODUODENOSCOPY  05/09/2013   Hiatal Hernia. Presbyesophagus. Minimal antral gastritis.   GUM SURGERY     JOINT REPLACEMENT     BIL knee    KNEE ARTHROPLASTY     Right knee 2012, Left knee total 2009   TUBAL LIGATION Bilateral    UPPER GI ENDOSCOPY  08/2019   Social History   Social History Narrative   Not on file   Immunization History  Administered Date(s) Administered   Moderna Sars-Covid-2 Vaccination 09/14/2019, 10/12/2019, 07/11/2020     Objective: Vital Signs: BP 126/71 (BP Location: Right Arm, Patient Position: Sitting, Cuff Size: Normal)   Pulse 60   Ht 5' (1.524 m)   Wt 154 lb 6.4 oz (70 kg)   BMI 30.15 kg/m    Physical Exam Vitals and nursing note reviewed.  Constitutional:      Appearance: She is well-developed.  HENT:     Head: Normocephalic and atraumatic.  Eyes:     Conjunctiva/sclera: Conjunctivae normal.  Pulmonary:     Effort: Pulmonary effort is normal.  Abdominal:     Palpations: Abdomen is soft.  Musculoskeletal:     Cervical back: Normal range of motion.  Skin:    General: Skin is warm and dry.     Capillary Refill: Capillary refill takes less than 2 seconds.  Neurological:     Mental Status: She is alert and oriented to person, place, and time.  Psychiatric:        Behavior: Behavior normal.     Musculoskeletal Exam: C-spine has slightly limited range of motion with no discomfort.  Postural thoracic kyphosis noted.  Shoulder joints and elbow joints have good range of motion with no discomfort.  Limited range of motion of both wrist joints.  Ulnar deviation of MCP joints.  No tenderness or synovitis over MCP or PIP joints.  PIP and DIP thickening consistent with osteoarthritis of both hands noted.  Some subluxation of the right first PIP joint.  Hip joints have good range of motion without discomfort.  Bilateral knee replacements have good range of motion with no warmth or effusion.  Ankle joints have good range of motion  with no joint tenderness.  Pitting edema noted bilaterally.  No tenderness over MTP joints.  Some tenderness over the right trochanteric bursa.  CDAI Exam: CDAI Score: 1  Patient Global: 5 mm; Provider Global: 5 mm Swollen: 0 ; Tender: 0  Joint Exam 04/20/2021   No joint exam has been documented for this visit   There is currently no information documented on the homunculus. Go to the Rheumatology activity and complete the homunculus joint exam.  Investigation: No additional findings.  Imaging: No results found.  Recent Labs: Lab Results  Component Value Date   WBC 4.6 03/31/2021   HGB 11.4 (L) 03/31/2021   PLT 207 03/31/2021   NA 139 03/31/2021   K 3.6 03/31/2021   CL 102 03/31/2021   CO2 29 03/31/2021   GLUCOSE  82 03/31/2021   BUN 17 03/31/2021   CREATININE 0.96 (H) 03/31/2021   BILITOT 0.5 03/31/2021   ALKPHOS 48 03/25/2017   AST 17 03/31/2021   ALT 7 03/31/2021   PROT 7.4 03/31/2021   ALBUMIN 3.8 03/25/2017   CALCIUM 9.7 03/31/2021   GFRAA 37 (L) 09/17/2020   QFTBGOLDPLUS NEGATIVE 05/29/2020    Speciality Comments: TB Gold: 03/25/17 Neg  We may have to discontinue Reclast based on her future GFR.  Procedures:  No procedures performed Allergies: Patient has no known allergies.     Assessment / Plan:     Visit Diagnoses: Seropositive rheumatoid arthritis of multiple sites ([place]) - +RF,+anti CCP: She has severe rheumatoid arthritis with multiple contractures and ulnar deviation: She has no joint tenderness or synovitis on examination today.  She has not had any recent rheumatoid arthritis flares.  She has clinically been doing well on Enbrel 50 mg subcutaneous injections every 7 days.  She has not missed any doses of Enbrel recently and continues to tolerate it without any side effects or recent infections.  She experiences occasional discomfort and tenderness in her ankle joints but has not noticed any joint swelling and no synovitis was noted on exam today.  She  is not experiencing any other joint pain or inflammation at this time. Updated x-rays of both hands and feet were obtained today to assess for radiographic progression since 2019.  She will remain on Enbrel as prescribed.  She was advised to notify us if she develops increased joint pain or joint swelling.  Discussed the importance of regular exercise.  She will follow-up in the office in 5 months.  - Plan: XR Hand 2 View Right, XR Hand 2 View Left, XR Foot 2 Views Left, XR Foot 2 Views Right  High risk medication use - Enbrel Sureclick 50 mg sq every 7 days - Plan: CMP14+EGFR, CBC with Differential/Platelet, QuantiFERON-TB Gold Plus CBC and CMP updated on 03/31/21.  Her next lab work will be due in November and every 3 months to monitor for drug toxicity.  Standing orders for CBC and CMP are in place. Standing orders for [facility] also placed today. TB gold negative on 05/29/20.  Future order for TB gold is in place.   She has not had any recent infections.  Discussed the importance of holding enbrel if she develops signs or symptoms of an infection and to resume once the infection has completely cleared.  She received the annual influenza vaccine today at her PCPs office.  Patient was advised to schedule a yearly skin exam. A referral to dermatology in [place] was placed today.  Screening for tuberculosis -Future order placed today for [facility]. Plan: QuantiFERON-TB Gold Plus  Age-related osteoporosis without current pathological fracture - DEXA on 02/01/2019 1/3 distal radius BMD 0.493 with T score -3.3. She completed 2 years of Forteo 20 mcg.  Prior to [facility] she had 4 Reclast IV infusions.  Her last reclast IV infusion was on 08/05/20.  She continues to take vitamin D 2000 units daily.   She had recent invasive oral surgery spring 2022.  She has had some difficulty healing and has been unable to eat solid foods. She has a follow up appointment in September 2022 with her oral surgeon. Discussed that she  will not be able to resume reclast until she has been cleared by her oral surgeon.  She is also due to update DEXA.  She denies any recent falls or fractures.  She has  occasional instability when walking prolonged distances, so she was strongly encouraged to use a cane or walker to assist with ambulation.  Future order for DEXA placed today.  Plan: DG BONE DENSITY (DXA)  History of vitamin D deficiency: She is taking vitamin D 2000 units daily.  Subdeltoid bursitis of right shoulder joint: Resolved.  She has good range of motion with no discomfort at this time.  History of total knee replacement, bilateral: Doing well.  She has good range of motion with no discomfort.  No warmth or effusion was noted.  She uses a cane on occasion to assist with ambulation.  She has not had any recent falls.  We discussed the importance of lower extremity muscle strengthening and fall prevention.  Trochanteric bursitis, right hip: She has tenderness over the right trochanteric bursa.  Discussed the importance of performing stretching exercises on a daily basis.  She was given a handout of these exercises to perform.  Other medical conditions are listed as follows:  History of hypertension: Blood pressure is 126/71 today in the office.  History of gastroesophageal reflux (GERD)    Orders: Orders Placed This Encounter  Procedures   DG BONE DENSITY (DXA)   XR Hand 2 View Right   XR Hand 2 View Left   XR Foot 2 Views Left   XR Foot 2 Views Right   CMP14+EGFR   CBC with Differential/Platelet   QuantiFERON-TB Gold Plus   Ambulatory referral to Dermatology   No orders of the defined types were placed in this encounter.     Follow-Up Instructions: Return in about 5 months (around 09/20/2021) for Rheumatoid arthritis, Osteoporosis.   Ofilia Neas, PA-C  Note - This record has been created using Dragon software.  Chart creation errors have been sought, but may not always  have been located. Such  creation errors do not reflect on  the standard of medical care.

## 2021-04-13 ENCOUNTER — Ambulatory Visit: Payer: Medicare Other | Admitting: Physician Assistant

## 2021-04-13 DIAGNOSIS — Z96653 Presence of artificial knee joint, bilateral: Secondary | ICD-10-CM

## 2021-04-13 DIAGNOSIS — M0579 Rheumatoid arthritis with rheumatoid factor of multiple sites without organ or systems involvement: Secondary | ICD-10-CM

## 2021-04-13 DIAGNOSIS — R7303 Prediabetes: Secondary | ICD-10-CM | POA: Diagnosis not present

## 2021-04-13 DIAGNOSIS — Z8639 Personal history of other endocrine, nutritional and metabolic disease: Secondary | ICD-10-CM

## 2021-04-13 DIAGNOSIS — M62838 Other muscle spasm: Secondary | ICD-10-CM

## 2021-04-13 DIAGNOSIS — Z8679 Personal history of other diseases of the circulatory system: Secondary | ICD-10-CM

## 2021-04-13 DIAGNOSIS — Z8719 Personal history of other diseases of the digestive system: Secondary | ICD-10-CM

## 2021-04-13 DIAGNOSIS — Z79899 Other long term (current) drug therapy: Secondary | ICD-10-CM

## 2021-04-13 DIAGNOSIS — I129 Hypertensive chronic kidney disease with stage 1 through stage 4 chronic kidney disease, or unspecified chronic kidney disease: Secondary | ICD-10-CM | POA: Diagnosis not present

## 2021-04-13 DIAGNOSIS — M810 Age-related osteoporosis without current pathological fracture: Secondary | ICD-10-CM

## 2021-04-13 DIAGNOSIS — M7061 Trochanteric bursitis, right hip: Secondary | ICD-10-CM

## 2021-04-13 DIAGNOSIS — M7551 Bursitis of right shoulder: Secondary | ICD-10-CM

## 2021-04-13 DIAGNOSIS — E785 Hyperlipidemia, unspecified: Secondary | ICD-10-CM | POA: Diagnosis not present

## 2021-04-20 ENCOUNTER — Ambulatory Visit (INDEPENDENT_AMBULATORY_CARE_PROVIDER_SITE_OTHER): Payer: Medicare Other | Admitting: Physician Assistant

## 2021-04-20 ENCOUNTER — Other Ambulatory Visit: Payer: Self-pay

## 2021-04-20 ENCOUNTER — Ambulatory Visit: Payer: Self-pay

## 2021-04-20 ENCOUNTER — Encounter: Payer: Self-pay | Admitting: Physician Assistant

## 2021-04-20 VITALS — BP 126/71 | HR 60 | Ht 60.0 in | Wt 154.4 lb

## 2021-04-20 DIAGNOSIS — Z79899 Other long term (current) drug therapy: Secondary | ICD-10-CM | POA: Diagnosis not present

## 2021-04-20 DIAGNOSIS — R7303 Prediabetes: Secondary | ICD-10-CM | POA: Diagnosis not present

## 2021-04-20 DIAGNOSIS — M79672 Pain in left foot: Secondary | ICD-10-CM

## 2021-04-20 DIAGNOSIS — M7551 Bursitis of right shoulder: Secondary | ICD-10-CM

## 2021-04-20 DIAGNOSIS — Z1339 Encounter for screening examination for other mental health and behavioral disorders: Secondary | ICD-10-CM | POA: Diagnosis not present

## 2021-04-20 DIAGNOSIS — N183 Chronic kidney disease, stage 3 unspecified: Secondary | ICD-10-CM | POA: Diagnosis not present

## 2021-04-20 DIAGNOSIS — Z23 Encounter for immunization: Secondary | ICD-10-CM | POA: Diagnosis not present

## 2021-04-20 DIAGNOSIS — M79671 Pain in right foot: Secondary | ICD-10-CM | POA: Diagnosis not present

## 2021-04-20 DIAGNOSIS — Z1283 Encounter for screening for malignant neoplasm of skin: Secondary | ICD-10-CM

## 2021-04-20 DIAGNOSIS — Z8719 Personal history of other diseases of the digestive system: Secondary | ICD-10-CM

## 2021-04-20 DIAGNOSIS — E785 Hyperlipidemia, unspecified: Secondary | ICD-10-CM | POA: Diagnosis not present

## 2021-04-20 DIAGNOSIS — Z96653 Presence of artificial knee joint, bilateral: Secondary | ICD-10-CM

## 2021-04-20 DIAGNOSIS — M0579 Rheumatoid arthritis with rheumatoid factor of multiple sites without organ or systems involvement: Secondary | ICD-10-CM

## 2021-04-20 DIAGNOSIS — M7061 Trochanteric bursitis, right hip: Secondary | ICD-10-CM | POA: Diagnosis not present

## 2021-04-20 DIAGNOSIS — M810 Age-related osteoporosis without current pathological fracture: Secondary | ICD-10-CM | POA: Diagnosis not present

## 2021-04-20 DIAGNOSIS — Z136 Encounter for screening for cardiovascular disorders: Secondary | ICD-10-CM | POA: Diagnosis not present

## 2021-04-20 DIAGNOSIS — Z8639 Personal history of other endocrine, nutritional and metabolic disease: Secondary | ICD-10-CM

## 2021-04-20 DIAGNOSIS — M79641 Pain in right hand: Secondary | ICD-10-CM

## 2021-04-20 DIAGNOSIS — M79642 Pain in left hand: Secondary | ICD-10-CM | POA: Diagnosis not present

## 2021-04-20 DIAGNOSIS — Z8679 Personal history of other diseases of the circulatory system: Secondary | ICD-10-CM | POA: Diagnosis not present

## 2021-04-20 DIAGNOSIS — Z1331 Encounter for screening for depression: Secondary | ICD-10-CM | POA: Diagnosis not present

## 2021-04-20 DIAGNOSIS — Z111 Encounter for screening for respiratory tuberculosis: Secondary | ICD-10-CM

## 2021-04-20 DIAGNOSIS — I129 Hypertensive chronic kidney disease with stage 1 through stage 4 chronic kidney disease, or unspecified chronic kidney disease: Secondary | ICD-10-CM | POA: Diagnosis not present

## 2021-04-20 DIAGNOSIS — Z0000 Encounter for general adult medical examination without abnormal findings: Secondary | ICD-10-CM | POA: Diagnosis not present

## 2021-04-20 DIAGNOSIS — Z139 Encounter for screening, unspecified: Secondary | ICD-10-CM | POA: Diagnosis not present

## 2021-04-20 NOTE — Patient Instructions (Addendum)
Standing Labs We placed an order today for your standing lab work.   Please have your standing labs drawn in November and every 3 months   If possible, please have your labs drawn 2 weeks prior to your appointment so that the provider can discuss your results at your appointment.  Please note that you may see your imaging and lab results in [place] before we have reviewed them. We may be awaiting multiple results to interpret others before contacting you. Please allow our office up to 72 hours to thoroughly review all of the results before contacting the office for clarification of your results.  We have open lab daily: Monday through Thursday from 1:30-4:30 PM and Friday from 1:30-4:00 PM at the office of Dr. Bo Merino, [facility].   Please be advised, all patients with office appointments requiring lab work will take precedent over walk-in lab work.  If possible, please come for your lab work on Monday and Friday afternoons, as you may experience shorter wait times. The office is located at [address], [place], [place], [place] [address] No appointment is necessary.   Labs are drawn by [facility]. Please bring your co-pay at the time of your lab draw.  You may receive a bill from [facility] for your lab work.  If you wish to have your labs drawn at another location, please call the office 24 hours in advance to send orders.  If you have any questions regarding directions or hours of operation,  please call [phone].   As a reminder, please drink plenty of water prior to coming for your lab work. Thanks!  Hand Exercises Hand exercises can be helpful for almost anyone. These exercises can strengthen the hands, improve flexibility and movement, and increase blood flow to the hands. These results can make work and daily tasks easier. Hand exercises can be especially helpful for people who have joint pain from arthritis or have nerve damage from overuse (carpal tunnel  syndrome). These exercises can also help people who have injured a hand. Exercises Most of these hand exercises are gentle stretching and motion exercises. It is usually safe to do them often throughout the day. Warming up your hands before exercise may help to reduce stiffness. You can do this with gentle massage orby placing your hands in warm water for 10-15 minutes. It is normal to feel some stretching, pulling, tightness, or mild discomfort as you begin new exercises. This will gradually improve. Stop an exercise right away if you feel sudden, severe pain or your pain gets worse. Ask your healthcare provider which exercises are best for you. Knuckle bend or "claw" fist Stand or sit with your arm, hand, and all five fingers pointed straight up. Make sure to keep your wrist straight during the exercise. Gently bend your fingers down toward your palm until the tips of your fingers are touching the top of your palm. Keep your big knuckle straight and just bend the small knuckles in your fingers. Hold this position for __________ seconds. Straighten (extend) your fingers back to the starting position. Repeat this exercise 5-10 times with each hand. Full finger fist Stand or sit with your arm, hand, and all five fingers pointed straight up. Make sure to keep your wrist straight during the exercise. Gently bend your fingers into your palm until the tips of your fingers are touching the middle of your palm. Hold this position for __________ seconds. Extend your fingers back to the starting position, stretching every joint  fully. Repeat this exercise 5-10 times with each hand. Straight fist Stand or sit with your arm, hand, and all five fingers pointed straight up. Make sure to keep your wrist straight during the exercise. Gently bend your fingers at the big knuckle, where your fingers meet your hand, and the middle knuckle. Keep the knuckle at the tips of your fingers straight and try to touch the  bottom of your palm. Hold this position for __________ seconds. Extend your fingers back to the starting position, stretching every joint fully. Repeat this exercise 5-10 times with each hand. Tabletop Stand or sit with your arm, hand, and all five fingers pointed straight up. Make sure to keep your wrist straight during the exercise. Gently bend your fingers at the big knuckle, where your fingers meet your hand, as far down as you can while keeping the small knuckles in your fingers straight. Think of forming a tabletop with your fingers. Hold this position for __________ seconds. Extend your fingers back to the starting position, stretching every joint fully. Repeat this exercise 5-10 times with each hand. Finger spread Place your hand flat on a table with your palm facing down. Make sure your wrist stays straight as you do this exercise. Spread your fingers and thumb apart from each other as far as you can until you feel a gentle stretch. Hold this position for __________ seconds. Bring your fingers and thumb tight together again. Hold this position for __________ seconds. Repeat this exercise 5-10 times with each hand. Making circles Stand or sit with your arm, hand, and all five fingers pointed straight up. Make sure to keep your wrist straight during the exercise. Make a circle by touching the tip of your thumb to the tip of your index finger. Hold for __________ seconds. Then open your hand wide. Repeat this motion with your thumb and each finger on your hand. Repeat this exercise 5-10 times with each hand. Thumb motion Sit with your forearm resting on a table and your wrist straight. Your thumb should be facing up toward the ceiling. Keep your fingers relaxed as you move your thumb. Lift your thumb up as high as you can toward the ceiling. Hold for __________ seconds. Bend your thumb across your palm as far as you can, reaching the tip of your thumb for the small finger (pinkie) side  of your palm. Hold for __________ seconds. Repeat this exercise 5-10 times with each hand. Grip strengthening  Hold a stress ball or other soft ball in the middle of your hand. Slowly increase the pressure, squeezing the ball as much as you can without causing pain. Think of bringing the tips of your fingers into the middle of your palm. All of your finger joints should bend when doing this exercise. Hold your squeeze for __________ seconds, then relax. Repeat this exercise 5-10 times with each hand. Contact a health care provider if: Your hand pain or discomfort gets much worse when you do an exercise. Your hand pain or discomfort does not improve within 2 hours after you exercise. If you have any of these problems, stop doing these exercises right away. Do not do them again unless your health care provider says that you can. Get help right away if: You develop sudden, severe hand pain or swelling. If this happens, stop doing these exercises right away. Do not do them again unless your health care provider says that you can. This information is not intended to replace advice given to you by  your health care provider. Make sure you discuss any questions you have with your healthcare provider. Document Revised: 11/30/2018 Document Reviewed: 08/10/2018 Elsevier Patient Education  2022 [place].   Hip Bursitis Rehab Ask your health care provider which exercises are safe for you. Do exercises exactly as told by your health care provider and adjust them as directed. It is normal to feel mild stretching, pulling, tightness, or discomfort as you do these exercises. Stop right away if you feel sudden pain or your pain gets worse. Do not begin these exercises until told by your health care provider. Stretching exercise This exercise warms up your muscles and joints and improves the movement andflexibility of your hip. This exercise also helps to relieve pain and stiffness. Iliotibial band  stretch An iliotibial band is a strong band of muscle tissue that runs from the outer side of your hip to the outer side of your thigh and knee. Lie on your side with your left / right leg in the top position. Bend your left / right knee and grab your ankle. Stretch out your bottom arm to help you balance. Slowly bring your knee back so your thigh is behind your body. Slowly lower your knee toward the floor until you feel a gentle stretch on the outside of your left / right thigh. If you do not feel a stretch and your knee will not fall farther, place the heel of your other foot on top of your knee and pull your knee down toward the floor with your foot. Hold this position for __________ seconds. Slowly return to the starting position. Repeat __________ times. Complete this exercise __________ times a day. Strengthening exercises These exercises build strength and endurance in your hip and pelvis. Enduranceis the ability to use your muscles for a long time, even after they get tired. Bridge This exercise strengthens the muscles that move your thigh backward (hip extensors). Lie on your back on a firm surface with your knees bent and your feet flat on the floor. Tighten your buttocks muscles and lift your buttocks off the floor until your trunk is level with your thighs. Do not arch your back. You should feel the muscles working in your buttocks and the back of your thighs. If you do not feel these muscles, slide your feet 1-2 inches (2.5-5 cm) farther away from your buttocks. If this exercise is too easy, try doing it with your arms crossed over your chest. Hold this position for __________ seconds. Slowly lower your hips to the starting position. Let your muscles relax completely after each repetition. Repeat __________ times. Complete this exercise __________ times a day. Squats This exercise strengthens the muscles in front of your thigh and knee (quadriceps). Stand in front of a table,  with your feet and knees pointing straight ahead. You may rest your hands on the table for balance but not for support. Slowly bend your knees and lower your hips like you are going to sit in a chair. Keep your weight over your heels, not over your toes. Keep your lower legs upright so they are parallel with the table legs. Do not let your hips go lower than your knees. Do not bend lower than told by your health care provider. If your hip pain increases, do not bend as low. Hold the squat position for __________ seconds. Slowly push with your legs to return to standing. Do not use your hands to pull yourself to standing. Repeat __________ times. Complete this exercise __________  times a day. Hip hike Stand sideways on a bottom step. Stand on your left / right leg with your other foot unsupported next to the step. You can hold on to the railing or wall for balance if needed. Keep your knees straight and your torso square. Then lift your left / right hip up toward the ceiling. Hold this position for __________ seconds. Slowly let your left / right hip lower toward the floor, past the starting position. Your foot should get closer to the floor. Do not lean or bend your knees. Repeat __________ times. Complete this exercise __________ times a day. Single leg stand Without shoes, stand near a railing or in a doorway. You may hold on to the railing or door frame as needed for balance. Squeeze your left / right buttock muscles, then lift up your other foot. Do not let your left / right hip push out to the side. It is helpful to stand in front of a mirror for this exercise so you can watch your hip. Hold this position for __________ seconds. Repeat __________ times. Complete this exercise __________ times a day. This information is not intended to replace advice given to you by your health care provider. Make sure you discuss any questions you have with your healthcare provider. Document Revised:  12/04/2018 Document Reviewed: 12/04/2018 Elsevier Patient Education  [place].

## 2021-04-21 NOTE — Progress Notes (Signed)
Please call the patient to review x-ray results.  Findings are consistent with severe RA and OA overlap.  No radiographic progression was noted since 2019.

## 2021-04-23 DIAGNOSIS — M069 Rheumatoid arthritis, unspecified: Secondary | ICD-10-CM | POA: Diagnosis not present

## 2021-04-23 DIAGNOSIS — I129 Hypertensive chronic kidney disease with stage 1 through stage 4 chronic kidney disease, or unspecified chronic kidney disease: Secondary | ICD-10-CM | POA: Diagnosis not present

## 2021-04-23 DIAGNOSIS — E785 Hyperlipidemia, unspecified: Secondary | ICD-10-CM | POA: Diagnosis not present

## 2021-05-05 DIAGNOSIS — Z780 Asymptomatic menopausal state: Secondary | ICD-10-CM | POA: Diagnosis not present

## 2021-05-05 DIAGNOSIS — M810 Age-related osteoporosis without current pathological fracture: Secondary | ICD-10-CM | POA: Diagnosis not present

## 2021-05-05 DIAGNOSIS — M8589 Other specified disorders of bone density and structure, multiple sites: Secondary | ICD-10-CM | POA: Diagnosis not present

## 2021-05-08 ENCOUNTER — Telehealth: Payer: Self-pay | Admitting: *Deleted

## 2021-05-08 NOTE — Telephone Encounter (Signed)
Received DEXA results from [facility].  Date of Scan: 05/05/2021  Dx: Osteoporosis  Lowest T-score:-3.1  BMD:0.507  Lowest site measured:1/3 Distal Radius  Significant changes in BMD and site measured (5% and above):-6% Right total Femur, 5% Left Total Femur  Current Regimen:Reclast, Last infusion 08/05/2020, Vitamin D History of Forteo use, completed 2 years.   Recommendation:Repeat Reclast infusion x 1 in 07/2021. Plan drug holiday or switch to Prolia after that.   Reviewed by:Dr. Bo Merino   Next Appointment:  09/21/2021

## 2021-05-12 NOTE — Telephone Encounter (Signed)
Patient advised bone density scan shows Osteoporosis. Patient advised to repeat Reclast infusion x 1 in 07/2021. Plan drug holiday or switch to Prolia after that. Patient advised we will discuss at her follow up in January 2023.

## 2021-05-23 DIAGNOSIS — M069 Rheumatoid arthritis, unspecified: Secondary | ICD-10-CM | POA: Diagnosis not present

## 2021-05-23 DIAGNOSIS — E785 Hyperlipidemia, unspecified: Secondary | ICD-10-CM | POA: Diagnosis not present

## 2021-05-23 DIAGNOSIS — I129 Hypertensive chronic kidney disease with stage 1 through stage 4 chronic kidney disease, or unspecified chronic kidney disease: Secondary | ICD-10-CM | POA: Diagnosis not present

## 2021-05-25 DIAGNOSIS — D485 Neoplasm of uncertain behavior of skin: Secondary | ICD-10-CM | POA: Diagnosis not present

## 2021-05-28 DIAGNOSIS — Z961 Presence of intraocular lens: Secondary | ICD-10-CM | POA: Diagnosis not present

## 2021-06-04 ENCOUNTER — Other Ambulatory Visit: Payer: Self-pay

## 2021-06-04 DIAGNOSIS — N1831 Chronic kidney disease, stage 3a: Secondary | ICD-10-CM | POA: Diagnosis not present

## 2021-06-04 DIAGNOSIS — Z23 Encounter for immunization: Secondary | ICD-10-CM | POA: Diagnosis not present

## 2021-06-04 DIAGNOSIS — M810 Age-related osteoporosis without current pathological fracture: Secondary | ICD-10-CM | POA: Diagnosis not present

## 2021-06-04 DIAGNOSIS — E46 Unspecified protein-calorie malnutrition: Secondary | ICD-10-CM | POA: Diagnosis not present

## 2021-06-04 DIAGNOSIS — M0579 Rheumatoid arthritis with rheumatoid factor of multiple sites without organ or systems involvement: Secondary | ICD-10-CM

## 2021-06-04 MED ORDER — ENBREL SURECLICK 50 MG/ML ~~LOC~~ SOAJ: 50.0000 mg | SUBCUTANEOUS | 0 refills | Status: DC

## 2021-06-04 NOTE — Telephone Encounter (Signed)
Next Visit: 09/21/2021  Last Visit: 04/20/2021  Last Fill: 03/31/2021  EF:[id number] rheumatoid arthritis of multiple sites   Current Dose per office note 4/45/1460: Enbrel Sureclick 50 mg sq every 7 days   Labs: 03/31/2021 Hemoglobin is low and stable.  Creatinine is mildly elevated and better than previous labs.  TB Gold: 05/29/2020 Neg    Patient advised she is due to update her TB Gold. Patient states she will come in next week to update.  Okay to refill Enbrel?

## 2021-06-04 NOTE — Telephone Encounter (Signed)
Patient called requesting prescription refill of Enbrel.   °

## 2021-06-05 ENCOUNTER — Telehealth: Payer: Self-pay | Admitting: Physician Assistant

## 2021-06-05 NOTE — Telephone Encounter (Signed)
I received a message yesterday afternoon (06/04/21) regarding a call from Dr. Nyra Capes about our mutual patient.  I attempted to return Dr. Nyra Capes' call yesterday afternoon but had to leave a voicemail to return my call.  I tried to call back today and had to leave another voicemail. I stated that she can attempt to call us back Monday morning once Dr. Estanislado Pandy will be back in the office.   Hazel Sams, PA-C

## 2021-06-18 ENCOUNTER — Other Ambulatory Visit: Payer: Self-pay | Admitting: *Deleted

## 2021-06-18 DIAGNOSIS — Z9225 Personal history of immunosupression therapy: Secondary | ICD-10-CM | POA: Diagnosis not present

## 2021-06-18 DIAGNOSIS — Z79899 Other long term (current) drug therapy: Secondary | ICD-10-CM | POA: Diagnosis not present

## 2021-06-18 DIAGNOSIS — Z111 Encounter for screening for respiratory tuberculosis: Secondary | ICD-10-CM

## 2021-06-19 NOTE — Progress Notes (Signed)
CBC and CMP are normal.  Eosinophils are mildly elevated which is not significant.

## 2021-06-20 LAB — QUANTIFERON-TB GOLD PLUS
Mitogen-NIL: >10 IU/mL
NIL: 0.02 IU/mL
QuantiFERON-TB Gold Plus: NEGATIVE
TB1-NIL: <0 IU/mL
TB2-NIL: <0 IU/mL

## 2021-06-20 LAB — CBC WITH DIFFERENTIAL/PLATELET
Absolute Monocytes: 424 cells/uL (ref 200–950)
Basophils Absolute: 42 cells/uL (ref 0–200)
Basophils Relative: 0.8 %
Eosinophils Absolute: 625 cells/uL — ABNORMAL HIGH (ref 15–500)
Eosinophils Relative: 11.8 %
HCT: 37 % (ref 35.0–45.0)
Hemoglobin: 12.2 g/dL (ref 11.7–15.5)
Lymphs Abs: 2253 cells/uL (ref 850–3900)
MCH: 30.7 pg (ref 27.0–33.0)
MCHC: 33 g/dL (ref 32.0–36.0)
MCV: 93.2 fL (ref 80.0–100.0)
MPV: 9.8 fL (ref 7.5–12.5)
Monocytes Relative: 8 %
Neutro Abs: 1956 cells/uL (ref 1500–7800)
Neutrophils Relative %: 36.9 %
Platelets: 238 10*3/uL (ref 140–400)
RBC: 3.97 10*6/uL (ref 3.80–5.10)
RDW: 12.2 % (ref 11.0–15.0)
Total Lymphocyte: 42.5 %
WBC: 5.3 10*3/uL (ref 3.8–10.8)

## 2021-06-20 LAB — COMPLETE METABOLIC PANEL WITH GFR
AG Ratio: 1.1 (calc) (ref 1.0–2.5)
ALT: 13 U/L (ref 6–29)
AST: 21 U/L (ref 10–35)
Albumin: 3.9 g/dL (ref 3.6–5.1)
Alkaline phosphatase (APISO): 55 U/L (ref 37–153)
BUN: 17 mg/dL (ref 7–25)
CO2: 31 mmol/L (ref 20–32)
Calcium: 9.7 mg/dL (ref 8.6–10.4)
Chloride: 99 mmol/L (ref 98–110)
Creat: 0.88 mg/dL (ref 0.60–0.95)
Globulin: 3.7 g/dL (calc) (ref 1.9–3.7)
Glucose, Bld: 79 mg/dL (ref 65–99)
Potassium: 3.6 mmol/L (ref 3.5–5.3)
Sodium: 138 mmol/L (ref 135–146)
Total Bilirubin: 0.4 mg/dL (ref 0.2–1.2)
Total Protein: 7.6 g/dL (ref 6.1–8.1)
eGFR: 66 mL/min/{1.73_m2} (ref >=60)

## 2021-06-21 NOTE — Progress Notes (Signed)
TB Gold is negative.

## 2021-06-23 DIAGNOSIS — E785 Hyperlipidemia, unspecified: Secondary | ICD-10-CM | POA: Diagnosis not present

## 2021-06-23 DIAGNOSIS — M069 Rheumatoid arthritis, unspecified: Secondary | ICD-10-CM | POA: Diagnosis not present

## 2021-06-23 DIAGNOSIS — I129 Hypertensive chronic kidney disease with stage 1 through stage 4 chronic kidney disease, or unspecified chronic kidney disease: Secondary | ICD-10-CM | POA: Diagnosis not present

## 2021-07-23 DIAGNOSIS — E785 Hyperlipidemia, unspecified: Secondary | ICD-10-CM | POA: Diagnosis not present

## 2021-07-23 DIAGNOSIS — M069 Rheumatoid arthritis, unspecified: Secondary | ICD-10-CM | POA: Diagnosis not present

## 2021-07-23 DIAGNOSIS — I129 Hypertensive chronic kidney disease with stage 1 through stage 4 chronic kidney disease, or unspecified chronic kidney disease: Secondary | ICD-10-CM | POA: Diagnosis not present

## 2021-08-11 ENCOUNTER — Telehealth: Payer: Self-pay

## 2021-08-11 NOTE — Telephone Encounter (Signed)
PA renewal initiated automatically by CoverMyMeds.  Submitted a Prior Authorization request to [facility] for ENBREL via CoverMyMeds. Will update once we receive a response.   Key: [id number]

## 2021-08-12 NOTE — Telephone Encounter (Signed)
Received notification from [facility] regarding a prior authorization for ENBREL. Authorization has been APPROVED from 08/11/21 to 08/22/22.   Phone # [phone]  Patient receives through [place] PAP  Knox Saliva, PharmD, MPH, BCPS Clinical Pharmacist (Rheumatology and Pulmonology)

## 2021-08-12 NOTE — Telephone Encounter (Signed)
PA Authorization# [id number]

## 2021-08-23 DIAGNOSIS — M069 Rheumatoid arthritis, unspecified: Secondary | ICD-10-CM | POA: Diagnosis not present

## 2021-08-23 DIAGNOSIS — E785 Hyperlipidemia, unspecified: Secondary | ICD-10-CM | POA: Diagnosis not present

## 2021-08-23 DIAGNOSIS — I129 Hypertensive chronic kidney disease with stage 1 through stage 4 chronic kidney disease, or unspecified chronic kidney disease: Secondary | ICD-10-CM | POA: Diagnosis not present

## 2021-08-23 DIAGNOSIS — R7303 Prediabetes: Secondary | ICD-10-CM | POA: Diagnosis not present

## 2021-08-25 ENCOUNTER — Telehealth: Payer: Self-pay | Admitting: Pharmacist

## 2021-08-25 NOTE — Telephone Encounter (Signed)
Patient dropped off Enbrel PAP renewal application for [employer]. Provider portion signed by Hazel Sams, PA-C.  Submitted Patient Assistance Application to [employer] for ENBREL along with provider portion, patient portion, PA approval letter, and med list. Will update patient when we receive a response.  Fax# [phone] Phone# [phone]  Knox Saliva, PharmD, MPH, BCPS Clinical Pharmacist (Rheumatology and Pulmonology)

## 2021-08-31 ENCOUNTER — Telehealth: Payer: Self-pay | Admitting: Rheumatology

## 2021-08-31 NOTE — Telephone Encounter (Signed)
Patient called the office stating she had received a message from [place] regarding her PAP for Embrel. They stated they needed more information from the patient but she is unsure of what they need. Patient reached out to [place] to see if we knew what they needed from her.

## 2021-09-01 DIAGNOSIS — R7303 Prediabetes: Secondary | ICD-10-CM | POA: Diagnosis not present

## 2021-09-01 DIAGNOSIS — E785 Hyperlipidemia, unspecified: Secondary | ICD-10-CM | POA: Diagnosis not present

## 2021-09-07 DIAGNOSIS — N183 Chronic kidney disease, stage 3 unspecified: Secondary | ICD-10-CM | POA: Diagnosis not present

## 2021-09-07 DIAGNOSIS — R7303 Prediabetes: Secondary | ICD-10-CM | POA: Diagnosis not present

## 2021-09-07 DIAGNOSIS — I129 Hypertensive chronic kidney disease with stage 1 through stage 4 chronic kidney disease, or unspecified chronic kidney disease: Secondary | ICD-10-CM | POA: Diagnosis not present

## 2021-09-07 DIAGNOSIS — N1831 Chronic kidney disease, stage 3a: Secondary | ICD-10-CM | POA: Diagnosis not present

## 2021-09-07 NOTE — Progress Notes (Deleted)
Office Visit Note  Patient: Andrea [name]             Date of Birth: 1941-07-20           MRN: [id number]             PCP: Marco Collie, MD Referring: Marco Collie, MD Visit Date: 09/21/2021 Occupation: @GUAROCC @  Subjective:  No chief complaint on file.   History of Present Illness: Andrea [name] is a 81 y.o. female ***   Activities of Daily Living:  Patient reports morning stiffness for *** {minute/hour:19697}.   Patient {ACTIONS;DENIES/REPORTS:21021675::"Denies"} nocturnal pain.  Difficulty dressing/grooming: {ACTIONS;DENIES/REPORTS:21021675::"Denies"} Difficulty climbing stairs: {ACTIONS;DENIES/REPORTS:21021675::"Denies"} Difficulty getting out of chair: {ACTIONS;DENIES/REPORTS:21021675::"Denies"} Difficulty using hands for taps, buttons, cutlery, and/or writing: {ACTIONS;DENIES/REPORTS:21021675::"Denies"}  No Rheumatology ROS completed.   PMFS History:  Patient Active Problem List   Diagnosis Date Noted   History of hypertension 02/08/2017   History of gastroesophageal reflux (GERD) 02/08/2017   High risk medication use 12/27/2016   Vitamin D deficiency 12/27/2016   History of total knee replacement, bilateral 08/27/2016   Age-related osteoporosis without current pathological fracture 08/27/2016   Seropositive rheumatoid arthritis of multiple sites ([place]) 06/21/2016    Past Medical History:  Diagnosis Date   Arthritis    Cataract    GERD (gastroesophageal reflux disease)    History of colon polyps    Hypercholesterolemia    Hypertension    Osteoporosis     Family History  Problem Relation Age of Onset   Kidney disease Mother    Cancer Father    Stroke Brother    Lung disease Brother    Bladder Cancer Daughter    Rheum arthritis Daughter    Rheum arthritis Daughter    Colon cancer Neg Hx    Esophageal cancer Neg Hx    Rectal cancer Neg Hx    Stomach cancer Neg Hx    Past Surgical History:  Procedure Laterality Date   CARPAL TUNNEL RELEASE      COLONOSCOPY  12/23/2010   Mild pancolonic diverticulosis. Small internal hemorroids   COLONOSCOPY  08/2019   ESOPHAGOGASTRODUODENOSCOPY  05/09/2013   Hiatal Hernia. Presbyesophagus. Minimal antral gastritis.   GUM SURGERY     JOINT REPLACEMENT     BIL knee    KNEE ARTHROPLASTY     Right knee 2012, Left knee total 2009   TUBAL LIGATION Bilateral    UPPER GI ENDOSCOPY  08/2019   Social History   Social History Narrative   Not on file   Immunization History  Administered Date(s) Administered   Moderna Sars-Covid-2 Vaccination 09/14/2019, 10/12/2019, 07/11/2020     Objective: Vital Signs: There were no vitals taken for this visit.   Physical Exam   Musculoskeletal Exam: ***  CDAI Exam: CDAI Score: -- Patient Global: --; Provider Global: -- Swollen: --; Tender: -- Joint Exam 09/21/2021   No joint exam has been documented for this visit   There is currently no information documented on the homunculus. Go to the Rheumatology activity and complete the homunculus joint exam.  Investigation: No additional findings.  Imaging: No results found.  Recent Labs: Lab Results  Component Value Date   WBC 5.3 06/18/2021   HGB 12.2 06/18/2021   PLT 238 06/18/2021   NA 138 06/18/2021   K 3.6 06/18/2021   CL 99 06/18/2021   CO2 31 06/18/2021   GLUCOSE 79 06/18/2021   BUN 17 06/18/2021   CREATININE 0.88 06/18/2021   BILITOT 0.4 06/18/2021  ALKPHOS 48 03/25/2017   AST 21 06/18/2021   ALT 13 06/18/2021   PROT 7.6 06/18/2021   ALBUMIN 3.8 03/25/2017   CALCIUM 9.7 06/18/2021   GFRAA 37 (L) 09/17/2020   QFTBGOLDPLUS NEGATIVE 06/18/2021    Speciality Comments: TB Gold: 03/25/17 Neg  We may have to discontinue Reclast based on her future GFR.  Procedures:  No procedures performed Allergies: Patient has no known allergies.   Assessment / Plan:     Visit Diagnoses: No diagnosis found.  Orders: No orders of the defined types were placed in this encounter.  No orders  of the defined types were placed in this encounter.   Face-to-face time spent with patient was *** minutes. Greater than 50% of time was spent in counseling and coordination of care.  Follow-Up Instructions: No follow-ups on file.   Earnestine Mealing, CMA  Note - This record has been created using Editor, commissioning.  Chart creation errors have been sought, but may not always  have been located. Such creation errors do not reflect on  the standard of medical care.

## 2021-09-08 NOTE — Telephone Encounter (Addendum)
Per [employer], Patient Auth page (page 3) is cut off and prescriber form needs to be submitted  Provider form completed again today.  Refaxed completed application to [employer]  Knox Saliva, PharmD, MPH, BCPS Clinical Pharmacist (Rheumatology and Pulmonology)

## 2021-09-08 NOTE — Telephone Encounter (Signed)
Per [employer], Patient Auth page (page 3) is cut off and prescriber form needs to be submitted  Provider form completed again today.  Refaxed completed application to [place] Fax: [phone] Phone: [phone]  Knox Saliva, PharmD, MPH, BCPS Clinical Pharmacist (Rheumatology and Pulmonology)

## 2021-09-21 ENCOUNTER — Ambulatory Visit: Payer: Medicare Other | Admitting: Rheumatology

## 2021-09-21 DIAGNOSIS — Z79899 Other long term (current) drug therapy: Secondary | ICD-10-CM

## 2021-09-21 DIAGNOSIS — M7551 Bursitis of right shoulder: Secondary | ICD-10-CM

## 2021-09-21 DIAGNOSIS — Z96653 Presence of artificial knee joint, bilateral: Secondary | ICD-10-CM

## 2021-09-21 DIAGNOSIS — M810 Age-related osteoporosis without current pathological fracture: Secondary | ICD-10-CM

## 2021-09-21 DIAGNOSIS — Z8679 Personal history of other diseases of the circulatory system: Secondary | ICD-10-CM

## 2021-09-21 DIAGNOSIS — Z8719 Personal history of other diseases of the digestive system: Secondary | ICD-10-CM

## 2021-09-21 DIAGNOSIS — I129 Hypertensive chronic kidney disease with stage 1 through stage 4 chronic kidney disease, or unspecified chronic kidney disease: Secondary | ICD-10-CM | POA: Diagnosis not present

## 2021-09-21 DIAGNOSIS — Z8639 Personal history of other endocrine, nutritional and metabolic disease: Secondary | ICD-10-CM

## 2021-09-21 DIAGNOSIS — M7061 Trochanteric bursitis, right hip: Secondary | ICD-10-CM

## 2021-09-21 DIAGNOSIS — M0579 Rheumatoid arthritis with rheumatoid factor of multiple sites without organ or systems involvement: Secondary | ICD-10-CM

## 2021-09-23 DIAGNOSIS — E785 Hyperlipidemia, unspecified: Secondary | ICD-10-CM | POA: Diagnosis not present

## 2021-09-23 DIAGNOSIS — I129 Hypertensive chronic kidney disease with stage 1 through stage 4 chronic kidney disease, or unspecified chronic kidney disease: Secondary | ICD-10-CM | POA: Diagnosis not present

## 2021-09-23 DIAGNOSIS — M069 Rheumatoid arthritis, unspecified: Secondary | ICD-10-CM | POA: Diagnosis not present

## 2021-09-23 DIAGNOSIS — R7303 Prediabetes: Secondary | ICD-10-CM | POA: Diagnosis not present

## 2021-09-23 NOTE — Telephone Encounter (Signed)
Called [employer] to check status of patient's application, rep says she sees they received the missing info and will push patient's application forward for review. Turn around is 72 hours.  Phone# [phone]

## 2021-10-01 NOTE — Telephone Encounter (Signed)
Received a fax from  [place] regarding an approval for ENBREL patient assistance from 09/24/21 to 08/22/22.   Phone number: [phone]  Knox Saliva, PharmD, MPH, BCPS Clinical Pharmacist (Rheumatology and Pulmonology)

## 2021-10-02 NOTE — Progress Notes (Signed)
Office Visit Note  Patient: Andrea [name]             Date of Birth: October 31, 1940           MRN: [id number]             PCP: Marco Collie, MD Referring: Marco Collie, MD Visit Date: 10/15/2021 Occupation: @GUAROCC @  Subjective:  Medication management  History of Present Illness: Andrea [name] is a 81 y.o. female with history of seropositive rheumatoid arthritis, osteoarthritis and osteoporosis.  She states she has been taking Enbrel injections on a weekly basis.  She has been experiencing some discomfort in her right shoulder and left wrist.  None of the joints are swollen.  She has been tolerating Enbrel without any side effects.  She also wants to discuss future treatment for osteoporosis.  Her last Reclast infusion was in December 2021.  She had Forteo injections from July 2018 until October 2020.  She had Reclast in November 2020 and then in December 2021.  Prior to starting Forteo she had Reclast infusions and 2014, 2016 and 2017.  Activities of Daily Living:  Patient reports morning stiffness for a few minutes.   Patient Reports nocturnal pain.  Difficulty dressing/grooming: Denies Difficulty climbing stairs: Reports Difficulty getting out of chair: Reports Difficulty using hands for taps, buttons, cutlery, and/or writing: Reports  Review of Systems  Constitutional:  Positive for fatigue.  HENT:  Negative for mouth sores, mouth dryness and nose dryness.   Eyes:  Negative for pain, itching and dryness.  Respiratory:  Negative for shortness of breath and difficulty breathing.   Cardiovascular:  Negative for chest pain and palpitations.  Gastrointestinal:  Positive for constipation. Negative for blood in stool and diarrhea.  Endocrine: Negative for increased urination.  Genitourinary:  Negative for difficulty urinating.  Musculoskeletal:  Positive for joint pain, joint pain and morning stiffness. Negative for joint swelling, myalgias, muscle tenderness and myalgias.  Skin:   Negative for color change, rash, redness and sensitivity to sunlight.  Allergic/Immunologic: Negative for susceptible to infections.  Neurological:  Negative for dizziness, numbness, headaches, memory loss and weakness.  Hematological:  Negative for bruising/bleeding tendency and swollen glands.  Psychiatric/Behavioral:  Negative for depressed mood, confusion and sleep disturbance. The patient is not nervous/anxious.    PMFS History:  Patient Active Problem List   Diagnosis Date Noted   History of hypertension 02/08/2017   History of gastroesophageal reflux (GERD) 02/08/2017   High risk medication use 12/27/2016   Vitamin D deficiency 12/27/2016   History of total knee replacement, bilateral 08/27/2016   Age-related osteoporosis without current pathological fracture 08/27/2016   Seropositive rheumatoid arthritis of multiple sites ([place]) 06/21/2016    Past Medical History:  Diagnosis Date   Arthritis    Cataract    GERD (gastroesophageal reflux disease)    History of colon polyps    Hypercholesterolemia    Hypertension    Osteoporosis     Family History  Problem Relation Age of Onset   Kidney disease Mother    Cancer Father    Stroke Brother    Lung disease Brother    Bladder Cancer Daughter    Rheum arthritis Daughter    Rheum arthritis Daughter    Colon cancer Neg Hx    Esophageal cancer Neg Hx    Rectal cancer Neg Hx    Stomach cancer Neg Hx    Past Surgical History:  Procedure Laterality Date   CARPAL TUNNEL RELEASE  COLONOSCOPY  12/23/2010   Mild pancolonic diverticulosis. Small internal hemorroids   COLONOSCOPY  08/2019   ESOPHAGOGASTRODUODENOSCOPY  05/09/2013   Hiatal Hernia. Presbyesophagus. Minimal antral gastritis.   GUM SURGERY     JOINT REPLACEMENT     BIL knee    KNEE ARTHROPLASTY     Right knee 2012, Left knee total 2009   TUBAL LIGATION Bilateral    UPPER GI ENDOSCOPY  08/2019   Social History   Social History Narrative   Not on file    Immunization History  Administered Date(s) Administered   Moderna Sars-Covid-2 Vaccination 09/14/2019, 10/12/2019, 07/11/2020     Objective: Vital Signs: BP (!) 143/77 (BP Location: Left Arm, Patient Position: Sitting, Cuff Size: Normal)    Pulse (!) 58    Ht 5\' 2"  (1.575 m)    Wt 146 lb 3.2 oz (66.3 kg)    BMI 26.74 kg/m    Physical Exam Vitals and nursing note reviewed.  Constitutional:      Appearance: She is well-developed.  HENT:     Head: Normocephalic and atraumatic.  Eyes:     Conjunctiva/sclera: Conjunctivae normal.  Cardiovascular:     Rate and Rhythm: Normal rate and regular rhythm.     Heart sounds: Normal heart sounds.  Pulmonary:     Effort: Pulmonary effort is normal.     Breath sounds: Normal breath sounds.  Abdominal:     General: Bowel sounds are normal.     Palpations: Abdomen is soft.  Musculoskeletal:     Cervical back: Normal range of motion.  Lymphadenopathy:     Cervical: No cervical adenopathy.  Skin:    General: Skin is warm and dry.     Capillary Refill: Capillary refill takes less than 2 seconds.  Neurological:     Mental Status: She is alert and oriented to person, place, and time.  Psychiatric:        Behavior: Behavior normal.     Musculoskeletal Exam: She has some limitation with lateral rotation of the cervical spine.  Thoracic kyphosis was noted.  Shoulder joints and elbow joints with good range of motion.  She had limited range of motion of bilateral wrist joints.  Ulnar deviation was noted over all of her MCPs joint.  Synovial thickening was noted over MCPs with no synovitis.  PIP and DIP thickening was noted.  Hip joints were in good range of motion.  Bilateral knee joints were replaced without any effusion or warmth.  There was no tenderness over ankles or MTPs.  CDAI Exam: CDAI Score: -- Patient Global: --; Provider Global: -- Swollen: --; Tender: -- Joint Exam 10/15/2021   No joint exam has been documented for this visit    There is currently no information documented on the homunculus. Go to the Rheumatology activity and complete the homunculus joint exam.  Investigation: No additional findings.  Imaging: No results found.  Recent Labs: Lab Results  Component Value Date   WBC 5.3 06/18/2021   HGB 12.2 06/18/2021   PLT 238 06/18/2021   NA 138 06/18/2021   K 3.6 06/18/2021   CL 99 06/18/2021   CO2 31 06/18/2021   GLUCOSE 79 06/18/2021   BUN 17 06/18/2021   CREATININE 0.88 06/18/2021   BILITOT 0.4 06/18/2021   ALKPHOS 48 03/25/2017   AST 21 06/18/2021   ALT 13 06/18/2021   PROT 7.6 06/18/2021   ALBUMIN 3.8 03/25/2017   CALCIUM 9.7 06/18/2021   GFRAA 37 (L) 09/17/2020   QFTBGOLDPLUS  NEGATIVE 06/18/2021    Speciality Comments: TB Gold: 03/25/17 Neg Forteo 07/18-10/20 Reclast November 28, 2012, June 06, 2015, July 06, 2016, June 29, 2019, August 05, 2020  July 05 2021 DEXA scan showed T score -3.1, BMD 0.50 7,1/3 left left distal radius.  3% improvement from 2020.  Procedures:  No procedures performed Allergies: Patient has no known allergies.   Assessment / Plan:     Visit Diagnoses: Seropositive rheumatoid arthritis of multiple sites ([place]) - +RF,+anti CCP: She is clinically doing well on Enbrel subcu injections every week.  She denies any joint swelling.  She continues to have some discomfort in her hands due to underlying arthritis.  She gives history of intermittent discomfort in her left wrist joint.  No synovitis was noted.  High risk medication use - Enbrel Sureclick 50 mg sq every 7 days  - Plan: CBC with Differential/Platelet, COMPLETE METABOLIC PANEL WITH GFR today and then every 3 months to monitor for drug toxicity.  Information about immunization was placed in the AVS.  She was also advised to hold Enbrel in case she develops an infection and resume after the infection resolves.  Annual skin examination to screen for skin cancer was also advised.  Age-related  osteoporosis without current pathological fracture - DEXA on 02/01/2019 1/3 distal radius BMD 0.493 with T score -3.3. July 05 2021 DEXA scan showed T score -3.1, BMD 0.50 7,1/3 left left distal radius.  3% improvement from 2020.previous treatment :Forteo 07/18-10/20 Reclast November 28, 2012, June 06, 2015, July 06, 2016, June 29, 2019, August 05, 2020-I did detailed discussion with the patient regarding the DEXA results.  I also discussed the option of switching her to Prolia subcu injections due to low GFR.  Indications side effects contraindications were discussed.  We will apply for Prolia subcu injections.  Plan: VITAMIN D 25 Hydroxy (Vit-D Deficiency, Fractures)  Medication counseling:  Counseled patient on purpose, proper use, and adverse effects of Prolia.  Counseled patient that Prolia is a medication that must be injected every 6 months by a healthcare professional.  Advised patient to take calcium 1200 mg daily and vitamin D 800 units daily.  Reviewed the most common adverse effects of Prolia including risk of infection, osteonecrosis of the jaw, rash, and muscle/bone pain.  Patient confirms she does not have any major dental work planned at this time.  Reviewed with patient the signs/symptoms of low calcium and advised patient to alert [place] if she experiences these symptoms.  Provided patient with medication education material and answered all questions.  Will apply for Prolia through insurance and update when we receive a response.   History of vitamin D deficiency -she had vitamin D deficiency in the past.  She has been taking vitamin D on a regular basis.  Plan: VITAMIN D 25 Hydroxy (Vit-D Deficiency, Fractures)  Subdeltoid bursitis of right shoulder joint -she had good range of motion of her right shoulder joint.  She gives history of intermittent discomfort.  History of total knee replacement, bilateral-both knee joints were replaced.  No swelling was noted.  Trochanteric  bursitis, right hip-she had mild tenderness.  She has intermittent discomfort.  IT band stretches were discussed.  History of hypertension-blood pressure was elevated today.  She is been advised to monitor blood pressure closely.  History of gastroesophageal reflux (GERD)  Orders: Orders Placed This Encounter  Procedures   CBC with Differential/Platelet   COMPLETE METABOLIC PANEL WITH GFR   VITAMIN D 25 Hydroxy (Vit-D Deficiency, Fractures)  No orders of the defined types were placed in this encounter.    Follow-Up Instructions: Return in about 5 months (around 03/14/2022) for Rheumatoid arthritis, Osteoarthritis, Osteoporosis.   Bo Merino, MD  Note - This record has been created using Editor, commissioning.  Chart creation errors have been sought, but may not always  have been located. Such creation errors do not reflect on  the standard of medical care.

## 2021-10-15 ENCOUNTER — Encounter: Payer: Self-pay | Admitting: Rheumatology

## 2021-10-15 ENCOUNTER — Telehealth: Payer: Self-pay | Admitting: Pharmacist

## 2021-10-15 ENCOUNTER — Other Ambulatory Visit: Payer: Self-pay

## 2021-10-15 ENCOUNTER — Ambulatory Visit (INDEPENDENT_AMBULATORY_CARE_PROVIDER_SITE_OTHER): Payer: Medicare Other | Admitting: Rheumatology

## 2021-10-15 VITALS — BP 143/77 | HR 58 | Ht 62.0 in | Wt 146.2 lb

## 2021-10-15 DIAGNOSIS — Z96653 Presence of artificial knee joint, bilateral: Secondary | ICD-10-CM

## 2021-10-15 DIAGNOSIS — Z79899 Other long term (current) drug therapy: Secondary | ICD-10-CM | POA: Diagnosis not present

## 2021-10-15 DIAGNOSIS — M810 Age-related osteoporosis without current pathological fracture: Secondary | ICD-10-CM | POA: Diagnosis not present

## 2021-10-15 DIAGNOSIS — M0579 Rheumatoid arthritis with rheumatoid factor of multiple sites without organ or systems involvement: Secondary | ICD-10-CM | POA: Diagnosis not present

## 2021-10-15 DIAGNOSIS — M7061 Trochanteric bursitis, right hip: Secondary | ICD-10-CM | POA: Diagnosis not present

## 2021-10-15 DIAGNOSIS — Z8679 Personal history of other diseases of the circulatory system: Secondary | ICD-10-CM

## 2021-10-15 DIAGNOSIS — Z8639 Personal history of other endocrine, nutritional and metabolic disease: Secondary | ICD-10-CM

## 2021-10-15 DIAGNOSIS — Z8719 Personal history of other diseases of the digestive system: Secondary | ICD-10-CM

## 2021-10-15 DIAGNOSIS — M7551 Bursitis of right shoulder: Secondary | ICD-10-CM

## 2021-10-15 LAB — CBC WITH DIFFERENTIAL/PLATELET
Absolute Monocytes: 290 cells/uL (ref 200–950)
Basophils Absolute: 18 cells/uL (ref 0–200)
Basophils Relative: 0.4 %
Eosinophils Absolute: 451 cells/uL (ref 15–500)
Eosinophils Relative: 9.8 %
HCT: 37.2 % (ref 35.0–45.0)
Hemoglobin: 12.1 g/dL (ref 11.7–15.5)
Lymphs Abs: 1955 cells/uL (ref 850–3900)
MCH: 30.7 pg (ref 27.0–33.0)
MCHC: 32.5 g/dL (ref 32.0–36.0)
MCV: 94.4 fL (ref 80.0–100.0)
MPV: 10.1 fL (ref 7.5–12.5)
Monocytes Relative: 6.3 %
Neutro Abs: 1886 cells/uL (ref 1500–7800)
Neutrophils Relative %: 41 %
Platelets: 210 10*3/uL (ref 140–400)
RBC: 3.94 10*6/uL (ref 3.80–5.10)
RDW: 11.8 % (ref 11.0–15.0)
Total Lymphocyte: 42.5 %
WBC: 4.6 10*3/uL (ref 3.8–10.8)

## 2021-10-15 LAB — COMPLETE METABOLIC PANEL WITH GFR
AG Ratio: 1 (calc) (ref 1.0–2.5)
ALT: 7 U/L (ref 6–29)
AST: 17 U/L (ref 10–35)
Albumin: 3.9 g/dL (ref 3.6–5.1)
Alkaline phosphatase (APISO): 55 U/L (ref 37–153)
BUN/Creatinine Ratio: 23 (calc) — ABNORMAL HIGH (ref 6–22)
BUN: 23 mg/dL (ref 7–25)
CO2: 34 mmol/L — ABNORMAL HIGH (ref 20–32)
Calcium: 9.7 mg/dL (ref 8.6–10.4)
Chloride: 100 mmol/L (ref 98–110)
Creat: 0.99 mg/dL — ABNORMAL HIGH (ref 0.60–0.95)
Globulin: 3.9 g/dL (calc) — ABNORMAL HIGH (ref 1.9–3.7)
Glucose, Bld: 89 mg/dL (ref 65–99)
Potassium: 3.4 mmol/L — ABNORMAL LOW (ref 3.5–5.3)
Sodium: 139 mmol/L (ref 135–146)
Total Bilirubin: 0.5 mg/dL (ref 0.2–1.2)
Total Protein: 7.8 g/dL (ref 6.1–8.1)
eGFR: 57 mL/min/{1.73_m2} — ABNORMAL LOW (ref >=60)

## 2021-10-15 LAB — VITAMIN D 25 HYDROXY (VIT D DEFICIENCY, FRACTURES): Vit D, 25-Hydroxy: 113 ng/mL — ABNORMAL HIGH (ref 30–100)

## 2021-10-15 NOTE — Progress Notes (Signed)
Pharmacy Note  Subjective:  Patient presents today to [facility] for follow up office visit.   Patient was seen by the pharmacist for counseling on Prolia.  She is transitioning from Reclast to [place]. Last Reclast infusion was 08/05/20. Transitioning d/t to low GFR.  Objective: CMP     Component Value Date/Time   NA 138 06/18/2021 1511   K 3.6 06/18/2021 1511   CL 99 06/18/2021 1511   CO2 31 06/18/2021 1511   GLUCOSE 79 06/18/2021 1511   BUN 17 06/18/2021 1511   CREATININE 0.88 06/18/2021 1511   CALCIUM 9.7 06/18/2021 1511   PROT 7.6 06/18/2021 1511   ALBUMIN 3.8 03/25/2017 1102   AST 21 06/18/2021 1511   ALT 13 06/18/2021 1511   ALKPHOS 48 03/25/2017 1102   BILITOT 0.4 06/18/2021 1511   GFRNONAA 32 (L) 09/17/2020 1604   GFRAA 37 (L) 09/17/2020 1604    Vitamin D Lab Results  Component Value Date   VD25OH 60 07/07/2020    05/05/21 - DEXA on 02/01/2019 1/3 distal radius BMD 0.493 with T score -3.3. July 05 2021 DEXA scan showed T score -3.1, BMD 0.50 7,1/3 left left distal radius.  3% improvement from 2020.previous treatment :Forteo 07/18-10/20. Reclast November 28, 2012, June 06, 2015, July 06, 2016, June 29, 2019, August 05, 2020-I did detailed discussion with the patient regarding the DEXA results.   Assessment/Plan:  Counseled patient on purpose, proper use, and adverse effects of Prolia.  Counseled patient that Prolia is a medication that must be injected every 6 months by a healthcare professional.  Advised patient to take calcium 1200 mg daily and vitamin D 800 units daily.  Reviewed the most common adverse effects of Prolia including risk of infection, osteonecrosis of the jaw, rash, and muscle/bone pain.  Patient confirms she does not have any major dental work planned at this time.  Reviewed with patient the signs/symptoms of low calcium and advised patient to alert [place] if she experiences these symptoms.  Provided patient with medication education  material and answered all questions. Will apply for Prolia through patient's insurance.  Patient has Medicare + [employer] supplement. Patient aware that receiving through medical benefit will be most cost effective option for her.  She states she will call her dentist regarding dental procedures she had completed last year that require f/u. She will confirm with them that no further invasive dental procedures are needed.   Knox Saliva, PharmD, MPH, BCPS Clinical Pharmacist (Rheumatology and Pulmonology)

## 2021-10-15 NOTE — Patient Instructions (Signed)
Standing Labs We placed an order today for your standing lab work.   Please have your standing labs drawn in May and every 3 months  If possible, please have your labs drawn 2 weeks prior to your appointment so that the provider can discuss your results at your appointment.  Please note that you may see your imaging and lab results in [place] before we have reviewed them. We may be awaiting multiple results to interpret others before contacting you. Please allow our office up to 72 hours to thoroughly review all of the results before contacting the office for clarification of your results.  We have open lab daily: Monday through Thursday from 1:30-4:30 PM and Friday from 1:30-4:00 PM at the office of Dr. Bo Merino, [facility].   Please be advised, all patients with office appointments requiring lab work will take precedent over walk-in lab work.  If possible, please come for your lab work on Monday and Friday afternoons, as you may experience shorter wait times. The office is located at [address] [place], [place], [place] [address] No appointment is necessary.   Labs are drawn by [facility]. Please bring your co-pay at the time of your lab draw.  You may receive a bill from [facility] for your lab work.  Please note if you are on Hydroxychloroquine and and an order has been placed for a Hydroxychloroquine level, you will need to have it drawn 4 hours or more after your last dose.  If you wish to have your labs drawn at another location, please call the office 24 hours in advance to send orders.  If you have any questions regarding directions or hours of operation,  please call [phone].   As a reminder, please drink plenty of water prior to coming for your lab work. Thanks!   I putVaccines You are taking a medication(s) that can suppress your immune system.  The following immunizations are recommended: Flu annually Covid-19  Td/Tdap (tetanus, diphtheria,  pertussis) every 10 years Pneumonia (Prevnar 15 then Pneumovax 23 at least 1 year apart.  Alternatively, can take Prevnar 20 without needing additional dose) Shingrix: 2 doses from 4 weeks to 6 months apart  Please check with your PCP to make sure you are up to date.   If you have signs or symptoms of an infection or start antibiotics: First, call your PCP for workup of your infection. Hold your medication through the infection, until you complete your antibiotics, and until symptoms resolve if you take the following: Injectable medication (Actemra, Benlysta, Cimzia, Cosentyx, Enbrel, Humira, Kevzara, Orencia, Remicade, Simponi, Stelara, Taltz, Tremfya) Methotrexate Leflunomide (Arava) Mycophenolate (Cellcept) Roma Kayser, or Rinvoq   Please get annual skin examination to screen for skin cancer while you are on Enbrel a dermatologist.

## 2021-10-15 NOTE — Telephone Encounter (Addendum)
Please start Prolia BIV through medical benefit. Patient has Medicare + [place] - [id number], [phone]  Dx: Age-related osteoporosis (M81.0)  Previously tried therapies: Reclast x 2 doses on 08/05/20 - 06/29/2019 Forteo from 2018 through 05/2019 Reclast 11/28/12, 06/06/15, 07/06/16

## 2021-10-16 ENCOUNTER — Telehealth: Payer: Self-pay | Admitting: *Deleted

## 2021-10-16 DIAGNOSIS — M810 Age-related osteoporosis without current pathological fracture: Secondary | ICD-10-CM

## 2021-10-16 NOTE — Progress Notes (Signed)
CBC is normal.  GFR is low, potassium is low.  Most likely due to diuretic use.  She should discuss low potassium with her PCP.  Vitamin D is above the normal limits..  Patient should discontinue vitamin D intake.  We should repeat vitamin D level in 6 months.  Please forward results to her PCP.

## 2021-10-16 NOTE — Telephone Encounter (Signed)
-----   Message from Bo Merino, MD sent at 10/16/2021  3:23 PM EST ----- CBC is normal.  GFR is low, potassium is low.  Most likely due to diuretic use.  She should discuss low potassium with her PCP.  Vitamin D is above the normal limits..  Patient should discontinue vitamin D intake.  We should repeat vitamin D level in  6 months.  Please forward results to her PCP.

## 2021-10-21 DIAGNOSIS — E785 Hyperlipidemia, unspecified: Secondary | ICD-10-CM | POA: Diagnosis not present

## 2021-10-21 DIAGNOSIS — R7303 Prediabetes: Secondary | ICD-10-CM | POA: Diagnosis not present

## 2021-10-21 DIAGNOSIS — M069 Rheumatoid arthritis, unspecified: Secondary | ICD-10-CM | POA: Diagnosis not present

## 2021-10-21 DIAGNOSIS — I129 Hypertensive chronic kidney disease with stage 1 through stage 4 chronic kidney disease, or unspecified chronic kidney disease: Secondary | ICD-10-CM | POA: Diagnosis not present

## 2021-10-21 NOTE — Telephone Encounter (Signed)
Patient has coverage under traditional Medicare, which covers 80% of services received under Part B benefits.  ? ?Patient also has active coverage through [place]. This plan picks up remaining 20% not covered by Part B, and also provides reimbursement of up to 100% of any deductible. Per benefits summary, no pre-certification is ever required by Plan F. ?

## 2021-10-28 ENCOUNTER — Other Ambulatory Visit: Payer: Self-pay | Admitting: Pharmacist

## 2021-10-28 DIAGNOSIS — M810 Age-related osteoporosis without current pathological fracture: Secondary | ICD-10-CM

## 2021-10-28 NOTE — Telephone Encounter (Signed)
I called patient. She states she does not have any invasive dental procedures to complete or scheduled. She is comfortable with transitioning to Prolia. Order placed for [id number] [facility] Day and patient provided with phone number to schedule ? ?Knox Saliva, PharmD, MPH, BCPS ?Clinical Pharmacist (Rheumatology and Pulmonology) ?

## 2021-10-28 NOTE — Progress Notes (Signed)
Patient starting Prolia. Last Reclast infusion was 07/26/20 ?Diagnosis:  age-related osteoporosis ? ?Dose: '60mg'$  SQ every 6 months ? ?Last Clinic Visit: 10/15/21 ?Next Clinic Visit: 03/18/22 ? ?Labs: CBC, CMP, Vitamin D on 10/15/21 - Vitamin D was above normal so will discontinue Vitamin D and repeat lab in 6 months ? ?Orders placed for Prolia SQ x 1 dose. No premeds required. ? ?Called patient and provided with phone number for: ?[facility] [phone]) [place] ? ?Will follow-up to ensured scheduled and completed ? ?Knox Saliva, PharmD, MPH, BCPS ?Clinical Pharmacist (Rheumatology and Pulmonology) ? ?

## 2021-10-30 NOTE — Progress Notes (Addendum)
Patient scheduled PROLIA injection for 11/09/21. Will f/u to ensure completed ? ?Knox Saliva, PharmD, MPH, BCPS ?Clinical Pharmacist (Rheumatology and Pulmonology) ?

## 2021-11-09 ENCOUNTER — Ambulatory Visit (HOSPITAL_COMMUNITY)
Admission: RE | Admit: 2021-11-09 | Discharge: 2021-11-09 | Disposition: A | Discharge status: Home / Self Care | Payer: Medicare Other | Source: Ambulatory Visit | Attending: Rheumatology | Admitting: Rheumatology

## 2021-11-09 ENCOUNTER — Other Ambulatory Visit: Payer: Self-pay

## 2021-11-09 DIAGNOSIS — M810 Age-related osteoporosis without current pathological fracture: Secondary | ICD-10-CM | POA: Diagnosis not present

## 2021-11-09 MED ORDER — DENOSUMAB 60 MG/ML ~~LOC~~ SOSY
60.0000 mg | PREFILLED_SYRINGE | Freq: Once | SUBCUTANEOUS | Status: AC
  Administered 2021-11-09: 60 mg via SUBCUTANEOUS

## 2021-11-09 MED ORDER — DENOSUMAB 60 MG/ML ~~LOC~~ SOSY
PREFILLED_SYRINGE | SUBCUTANEOUS | Status: AC
  Filled 2021-11-09: qty 1

## 2021-11-21 DIAGNOSIS — E785 Hyperlipidemia, unspecified: Secondary | ICD-10-CM | POA: Diagnosis not present

## 2021-11-21 DIAGNOSIS — I129 Hypertensive chronic kidney disease with stage 1 through stage 4 chronic kidney disease, or unspecified chronic kidney disease: Secondary | ICD-10-CM | POA: Diagnosis not present

## 2021-12-21 DIAGNOSIS — E785 Hyperlipidemia, unspecified: Secondary | ICD-10-CM | POA: Diagnosis not present

## 2021-12-21 DIAGNOSIS — I129 Hypertensive chronic kidney disease with stage 1 through stage 4 chronic kidney disease, or unspecified chronic kidney disease: Secondary | ICD-10-CM | POA: Diagnosis not present

## 2022-01-21 DIAGNOSIS — E785 Hyperlipidemia, unspecified: Secondary | ICD-10-CM | POA: Diagnosis not present

## 2022-01-21 DIAGNOSIS — I129 Hypertensive chronic kidney disease with stage 1 through stage 4 chronic kidney disease, or unspecified chronic kidney disease: Secondary | ICD-10-CM | POA: Diagnosis not present

## 2022-02-01 DIAGNOSIS — R7303 Prediabetes: Secondary | ICD-10-CM | POA: Diagnosis not present

## 2022-02-01 DIAGNOSIS — E785 Hyperlipidemia, unspecified: Secondary | ICD-10-CM | POA: Diagnosis not present

## 2022-02-08 DIAGNOSIS — I129 Hypertensive chronic kidney disease with stage 1 through stage 4 chronic kidney disease, or unspecified chronic kidney disease: Secondary | ICD-10-CM | POA: Diagnosis not present

## 2022-02-08 DIAGNOSIS — Z6825 Body mass index (BMI) 25.0-25.9, adult: Secondary | ICD-10-CM | POA: Diagnosis not present

## 2022-02-08 DIAGNOSIS — R7303 Prediabetes: Secondary | ICD-10-CM | POA: Diagnosis not present

## 2022-02-08 DIAGNOSIS — N183 Chronic kidney disease, stage 3 unspecified: Secondary | ICD-10-CM | POA: Diagnosis not present

## 2022-02-08 DIAGNOSIS — E785 Hyperlipidemia, unspecified: Secondary | ICD-10-CM | POA: Diagnosis not present

## 2022-02-19 ENCOUNTER — Other Ambulatory Visit: Payer: Self-pay | Admitting: Family Medicine

## 2022-02-19 DIAGNOSIS — Z1231 Encounter for screening mammogram for malignant neoplasm of breast: Secondary | ICD-10-CM

## 2022-02-20 DIAGNOSIS — I129 Hypertensive chronic kidney disease with stage 1 through stage 4 chronic kidney disease, or unspecified chronic kidney disease: Secondary | ICD-10-CM | POA: Diagnosis not present

## 2022-02-20 DIAGNOSIS — E785 Hyperlipidemia, unspecified: Secondary | ICD-10-CM | POA: Diagnosis not present

## 2022-03-04 NOTE — Progress Notes (Unsigned)
Office Visit Note  Patient: Andrea [name]             Date of Birth: October 09, 1940           MRN: [id number]             PCP: Marco Collie, MD Referring: Marco Collie, MD Visit Date: 03/18/2022 Occupation: '@GUAROCC'$ @  Subjective:  Medication monitoring  History of Present Illness: Andrea [name] is a 81 y.o. female with history of seropositive rheumatoid arthritis and osteoarthritis.  She is one enbrel 50 mg sq injections once weekly.  She continues to tolerate Enbrel without any side effects or injection site reactions.  She denies missing any doses recently.  She has not any recent or recurrent infections.  She denies any signs or symptoms of a rheumatoid arthritis flare.  She has occasional discomfort in her right shoulder joint especially with certain positional changes.  She has some discomfort in her right shoulder if she lays on that side at night otherwise she has not had any nocturnal symptoms.  She denies any morning stiffness.  She is not having any joint swelling at this time.  She denies any new medical conditions.    Activities of Daily Living:  Patient reports morning stiffness for 0 minutes.   Patient Denies nocturnal pain.  Difficulty dressing/grooming: Denies Difficulty climbing stairs: Reports Difficulty getting out of chair: Reports Difficulty using hands for taps, buttons, cutlery, and/or writing: Reports  Review of Systems  Constitutional:  Positive for fatigue.  HENT:  Negative for mouth sores, mouth dryness and nose dryness.   Eyes:  Negative for pain, visual disturbance and dryness.  Respiratory:  Negative for cough, hemoptysis, shortness of breath and difficulty breathing.   Cardiovascular:  Negative for chest pain, palpitations, hypertension and swelling in legs/feet.  Gastrointestinal:  Positive for constipation. Negative for blood in stool and diarrhea.  Endocrine: Negative for increased urination.  Genitourinary:  Negative for painful urination and  involuntary urination.  Musculoskeletal:  Positive for joint pain and joint pain. Negative for joint swelling, myalgias, muscle weakness, morning stiffness, muscle tenderness and myalgias.  Skin:  Negative for color change, pallor, rash, hair loss, nodules/bumps, skin tightness, ulcers and sensitivity to sunlight.  Allergic/Immunologic: Negative for susceptible to infections.  Neurological:  Negative for dizziness, numbness, headaches and weakness.  Hematological:  Negative for swollen glands.  Psychiatric/Behavioral:  Positive for sleep disturbance. Negative for depressed mood. The patient is not nervous/anxious.     PMFS History:  Patient Active Problem List   Diagnosis Date Noted   History of hypertension 02/08/2017   History of gastroesophageal reflux (GERD) 02/08/2017   High risk medication use 12/27/2016   Vitamin D deficiency 12/27/2016   History of total knee replacement, bilateral 08/27/2016   Age-related osteoporosis without current pathological fracture 08/27/2016   Seropositive rheumatoid arthritis of multiple sites ([place]) 06/21/2016    Past Medical History:  Diagnosis Date   Arthritis    Cataract    GERD (gastroesophageal reflux disease)    History of colon polyps    Hypercholesterolemia    Hypertension    Osteoporosis     Family History  Problem Relation Age of Onset   Kidney disease Mother    Cancer Father    Stroke Brother    Lung disease Brother    Bladder Cancer Daughter    Rheum arthritis Daughter    Rheum arthritis Daughter    Colon cancer Neg Hx    Esophageal cancer Neg Hx  Rectal cancer Neg Hx    Stomach cancer Neg Hx    Past Surgical History:  Procedure Laterality Date   CARPAL TUNNEL RELEASE     COLONOSCOPY  12/23/2010   Mild pancolonic diverticulosis. Small internal hemorroids   COLONOSCOPY  08/2019   ESOPHAGOGASTRODUODENOSCOPY  05/09/2013   Hiatal Hernia. Presbyesophagus. Minimal antral gastritis.   GUM SURGERY     JOINT REPLACEMENT      BIL knee    KNEE ARTHROPLASTY     Right knee 2012, Left knee total 2009   TUBAL LIGATION Bilateral    UPPER GI ENDOSCOPY  08/2019   Social History   Social History Narrative   Not on file   Immunization History  Administered Date(s) Administered   Moderna Sars-Covid-2 Vaccination 09/14/2019, 10/12/2019, 07/11/2020     Objective: Vital Signs: BP 131/75 (BP Location: Left Arm, Patient Position: Sitting, Cuff Size: Normal)   Pulse (!) 53   Ht '5\' 1"'$  (1.549 m)   Wt 148 lb 12.8 oz (67.5 kg)   BMI 28.12 kg/m    Physical Exam Vitals and nursing note reviewed.  Constitutional:      Appearance: She is well-developed.  HENT:     Head: Normocephalic and atraumatic.  Eyes:     Conjunctiva/sclera: Conjunctivae normal.  Cardiovascular:     Rate and Rhythm: Normal rate and regular rhythm.     Heart sounds: Normal heart sounds.  Pulmonary:     Effort: Pulmonary effort is normal.     Breath sounds: Normal breath sounds.  Abdominal:     General: Bowel sounds are normal.     Palpations: Abdomen is soft.  Musculoskeletal:     Cervical back: Normal range of motion.  Skin:    General: Skin is warm and dry.     Capillary Refill: Capillary refill takes less than 2 seconds.  Neurological:     Mental Status: She is alert and oriented to person, place, and time.  Psychiatric:        Behavior: Behavior normal.      Musculoskeletal Exam: C-spine is limited range of motion with lateral rotation.  Thoracic episodes noted.  Shoulder joints have good range of motion with some tenderness ovation over the right shoulder.  Elbow joints have good range of motion with no tenderness or inflammation.  Limited range of motion of both wrist joints.  Ulnar deviation and synovial thickening of MCP joints.  No tenderness or synovitis of MCP or PIP joints.  PIP and DIP thickening consistent with osteoarthritis of both hands.  Hip joints have good range of motion with no groin pain.  Bilateral knee replacements  have good range of motion with no warmth or effusion.  Ankle joints have good range of motion with no tenderness or synovitis.  No tenderness over MTP joints.  CDAI Exam: CDAI Score: 1  Patient Global: 5 mm; Provider Global: 5 mm Swollen: 0 ; Tender: 0  Joint Exam 03/18/2022   No joint exam has been documented for this visit   There is currently no information documented on the homunculus. Go to the Rheumatology activity and complete the homunculus joint exam.  Investigation: No additional findings.  Imaging: No results found.  Recent Labs: Lab Results  Component Value Date   WBC 4.6 10/15/2021   HGB 12.1 10/15/2021   PLT 210 10/15/2021   NA 139 10/15/2021   K 3.4 (L) 10/15/2021   CL 100 10/15/2021   CO2 34 (H) 10/15/2021   GLUCOSE 89 10/15/2021  BUN 23 10/15/2021   CREATININE 0.99 (H) 10/15/2021   BILITOT 0.5 10/15/2021   ALKPHOS 48 03/25/2017   AST 17 10/15/2021   ALT 7 10/15/2021   PROT 7.8 10/15/2021   ALBUMIN 3.8 03/25/2017   CALCIUM 9.7 10/15/2021   GFRAA 37 (L) 09/17/2020   QFTBGOLDPLUS NEGATIVE 06/18/2021    Speciality Comments: TB Gold: 03/25/17 Neg Forteo 07/18-10/20 Reclast November 28, 2012, June 06, 2015, July 06, 2016, June 29, 2019, August 05, 2020  Procedures:  No procedures performed Allergies: Patient has no known allergies.   Assessment / Plan:     Visit Diagnoses: Seropositive rheumatoid arthritis of multiple sites ([place]) - +RF,+anti CCP: She has no joint tenderness or synovitis on examination today.  She has not had any signs or symptoms of a rheumatoid arthritis flare.  She has clinically been doing well on Enbrel 50 mg sq injections once weekly.  She is tolerating Enbrel without any side effects or injection site reactions.  She has not had any recent or recurrent infections.  She experiences occasional discomfort in her right shoulder but declined a cortisone injection at this time.  She is not experiencing other joint pain or  inflammation at this time.  She has not had any morning stiffness or nocturnal pain.  She has no difficulty with ADLs.  She will remain on Enbrel as prescribed.  She was advised to notify us if she develops increased joint pain or joint swelling.  She will follow-up in the office in 5 months or sooner if needed.- Plan: etanercept (ENBREL SURECLICK) 50 MG/ML injection  High risk medication use - Enbrel Sureclick 50 mg sq every 7 days  - Plan: CBC with Differential/Platelet, COMPLETE METABOLIC PANEL WITH GFR, QuantiFERON-TB Gold Plus CBC and CMP updated on 10/15/21.  Orders for CBC and CMP released today.  Her next lab work will be due in October and every 3 months.  Standing orders for CBC and CMP are in place.  TB gold negative on 06/18/21. Future order for TB gold released today.  No recent or recurrent infections. Discussed the importance of holding enbrel if she develops signs or symptoms of an infection and to resume once the infections has cleared.  Discussed the importance of yearly skin exams. She has been seeing her dermatologist on a yearly.    Screening for tuberculosis - Future order for TB gold placed today. Plan: QuantiFERON-TB Gold Plus  Age-related osteoporosis without current pathological fracture - July 05 2021 DEXA scan showed T score -3.1, BMD 0.50 7,1/3 left left distal radius.  3% improvement from 2020.   Previous treatment :Forteo 07/18-10/20. Reclast November 28, 2012, June 06, 2015, July 06, 2016, June 29, 2019, August 05, 2020 Started on Prolia due to low GFR.  Prolia injection on 11/09/21. She is taking vitamin D 2000 units daily.   Medication monitoring encounter - Prolia injection: 11/09/2021.  History of vitamin D deficiency: She is taking vitamin D 2000 units daily.   Subdeltoid bursitis of right shoulder joint: She continues to experience intermittent discomfort in the right shoulder joint which is exacerbated by positional changes or lying on her side at  night.  On examination she has good range of motion of the right shoulder joint with no discomfort at this time.  She declined having a cortisone injection today.  Discussed the importance of performing shoulder exercises.  She was given a handout of these exercises to perform.  She was advised to notify us if her symptoms persist or  worsen.  History of total knee replacement, bilateral: Doing well.  She has good range of motion with no discomfort.  No effusion noted.  She has been walking more easily lately.  She has not had any recent falls or instability.  Other medical conditions are listed as follows:  Trochanteric bursitis, right hip: Resolved.   History of hypertension - BP 131/75 today in the office.   History of gastroesophageal reflux (GERD)      Orders: Orders Placed This Encounter  Procedures   CBC with Differential/Platelet   COMPLETE METABOLIC PANEL WITH GFR   QuantiFERON-TB Gold Plus   Meds ordered this encounter  Medications   etanercept (ENBREL SURECLICK) 50 MG/ML injection    Sig: Inject 50 mg into the skin once a week.    Dispense:  12 mL    Refill:  0     Follow-Up Instructions: Return in about 5 months (around 08/18/2022) for Rheumatoid arthritis, Osteoarthritis.   Ofilia Neas, PA-C  Note - This record has been created using Dragon software.  Chart creation errors have been sought, but may not always  have been located. Such creation errors do not reflect on  the standard of medical care.

## 2022-03-18 ENCOUNTER — Ambulatory Visit: Payer: Medicare HMO | Admitting: Physician Assistant

## 2022-03-18 ENCOUNTER — Encounter: Payer: Self-pay | Admitting: Physician Assistant

## 2022-03-18 VITALS — BP 131/75 | HR 53 | Ht 61.0 in | Wt 148.8 lb

## 2022-03-18 DIAGNOSIS — Z5181 Encounter for therapeutic drug level monitoring: Secondary | ICD-10-CM | POA: Diagnosis not present

## 2022-03-18 DIAGNOSIS — M0579 Rheumatoid arthritis with rheumatoid factor of multiple sites without organ or systems involvement: Secondary | ICD-10-CM | POA: Diagnosis not present

## 2022-03-18 DIAGNOSIS — M7061 Trochanteric bursitis, right hip: Secondary | ICD-10-CM

## 2022-03-18 DIAGNOSIS — M810 Age-related osteoporosis without current pathological fracture: Secondary | ICD-10-CM

## 2022-03-18 DIAGNOSIS — Z79899 Other long term (current) drug therapy: Secondary | ICD-10-CM

## 2022-03-18 DIAGNOSIS — Z8639 Personal history of other endocrine, nutritional and metabolic disease: Secondary | ICD-10-CM

## 2022-03-18 DIAGNOSIS — M7551 Bursitis of right shoulder: Secondary | ICD-10-CM

## 2022-03-18 DIAGNOSIS — Z96653 Presence of artificial knee joint, bilateral: Secondary | ICD-10-CM | POA: Diagnosis not present

## 2022-03-18 DIAGNOSIS — Z111 Encounter for screening for respiratory tuberculosis: Secondary | ICD-10-CM

## 2022-03-18 DIAGNOSIS — Z8679 Personal history of other diseases of the circulatory system: Secondary | ICD-10-CM

## 2022-03-18 DIAGNOSIS — Z8719 Personal history of other diseases of the digestive system: Secondary | ICD-10-CM

## 2022-03-18 MED ORDER — ENBREL SURECLICK 50 MG/ML ~~LOC~~ SOAJ: 50.0000 mg | SUBCUTANEOUS | 0 refills | Status: DC

## 2022-03-18 NOTE — Patient Instructions (Addendum)
Standing Labs We placed an order today for your standing lab work.   Please have your standing labs drawn in October and every 3 months  TB gold, CBC and CMP   If possible, please have your labs drawn 2 weeks prior to your appointment so that the provider can discuss your results at your appointment.  Please note that you may see your imaging and lab results in [place] before we have reviewed them. We may be awaiting multiple results to interpret others before contacting you. Please allow our office up to 72 hours to thoroughly review all of the results before contacting the office for clarification of your results.  We have open lab daily: Monday through Thursday from 1:30-4:30 PM and Friday from 1:30-4:00 PM at the office of Dr. Bo Merino, [facility].   Please be advised, all patients with office appointments requiring lab work will take precedent over walk-in lab work.  If possible, please come for your lab work on Monday and Friday afternoons, as you may experience shorter wait times. The office is located at [address] [place], [place], [place] [address] No appointment is necessary.   Labs are drawn by [facility]. Please bring your co-pay at the time of your lab draw.  You may receive a bill from [facility] for your lab work.  Please note if you are on Hydroxychloroquine and and an order has been placed for a Hydroxychloroquine level, you will need to have it drawn 4 hours or more after your last dose.  If you wish to have your labs drawn at another location, please call the office 24 hours in advance to send orders.  If you have any questions regarding directions or hours of operation,  please call [phone].   As a reminder, please drink plenty of water prior to coming for your lab work. Thanks!   If you have signs or symptoms of an infection or start antibiotics: First, call your PCP for workup of your infection. Hold your medication through the  infection, until you complete your antibiotics, and until symptoms resolve if you take the following: Injectable medication (Actemra, Benlysta, Cimzia, Cosentyx, Enbrel, Humira, Kevzara, Orencia, Remicade, Simponi, Stelara, Taltz, Tremfya) Methotrexate Leflunomide (Arava) Mycophenolate (Cellcept) Roma Kayser, or Rinvoq  Shoulder Exercises Ask your health care provider which exercises are safe for you. Do exercises exactly as told by your health care provider and adjust them as directed. It is normal to feel mild stretching, pulling, tightness, or discomfort as you do these exercises. Stop right away if you feel sudden pain or your pain gets worse. Do not begin these exercises until told by your health care provider. Stretching exercises External rotation and abduction This exercise is sometimes called corner stretch. This exercise rotates your arm outward (external rotation) and moves your arm out from your body (abduction). Stand in a doorway with one of your feet slightly in front of the other. This is called a staggered stance. If you cannot reach your forearms to the door frame, stand facing a corner of a room. Choose one of the following positions as told by your health care provider: Place your hands and forearms on the door frame above your head. Place your hands and forearms on the door frame at the height of your head. Place your hands on the door frame at the height of your elbows. Slowly move your weight onto your front foot until you feel a stretch across your chest and in the front of your  shoulders. Keep your head and chest upright and keep your abdominal muscles tight. Hold for __________ seconds. To release the stretch, shift your weight to your back foot. Repeat __________ times. Complete this exercise __________ times a day. Extension, standing Stand and hold a broomstick, a cane, or a similar object behind your back. Your hands should be a little wider than shoulder  width apart. Your palms should face away from your back. Keeping your elbows straight and your shoulder muscles relaxed, move the stick away from your body until you feel a stretch in your shoulders (extension). Avoid shrugging your shoulders while you move the stick. Keep your shoulder blades tucked down toward the middle of your back. Hold for __________ seconds. Slowly return to the starting position. Repeat __________ times. Complete this exercise __________ times a day. Range-of-motion exercises Pendulum  Stand near a wall or a surface that you can hold onto for balance. Bend at the waist and let your left / right arm hang straight down. Use your other arm to support you. Keep your back straight and do not lock your knees. Relax your left / right arm and shoulder muscles, and move your hips and your trunk so your left / right arm swings freely. Your arm should swing because of the motion of your body, not because you are using your arm or shoulder muscles. Keep moving your hips and trunk so your arm swings in the following directions, as told by your health care provider: Side to side. Forward and backward. In clockwise and counterclockwise circles. Continue each motion for __________ seconds, or for as long as told by your health care provider. Slowly return to the starting position. Repeat __________ times. Complete this exercise __________ times a day. Shoulder flexion, standing  Stand and hold a broomstick, a cane, or a similar object. Place your hands a little more than shoulder width apart on the object. Your left / right hand should be palm up, and your other hand should be palm down. Keep your elbow straight and your shoulder muscles relaxed. Push the stick up with your healthy arm to raise your left / right arm in front of your body, and then over your head until you feel a stretch in your shoulder (flexion). Avoid shrugging your shoulder while you raise your arm. Keep your  shoulder blade tucked down toward the middle of your back. Hold for __________ seconds. Slowly return to the starting position. Repeat __________ times. Complete this exercise __________ times a day. Shoulder abduction, standing Stand and hold a broomstick, a cane, or a similar object. Place your hands a little more than shoulder width apart on the object. Your left / right hand should be palm up, and your other hand should be palm down. Keep your elbow straight and your shoulder muscles relaxed. Push the object across your body toward your left / right side. Raise your left / right arm to the side of your body (abduction) until you feel a stretch in your shoulder. Do not raise your arm above shoulder height unless your health care provider tells you to do that. If directed, raise your arm over your head. Avoid shrugging your shoulder while you raise your arm. Keep your shoulder blade tucked down toward the middle of your back. Hold for __________ seconds. Slowly return to the starting position. Repeat __________ times. Complete this exercise __________ times a day. Internal rotation  Place your left / right hand behind your back, palm up. Use your other hand  to dangle an exercise band, a towel, or a similar object over your shoulder. Grasp the band with your left / right hand so you are holding on to both ends. Gently pull up on the band until you feel a stretch in the front of your left / right shoulder. The movement of your arm toward the center of your body is called internal rotation. Avoid shrugging your shoulder while you raise your arm. Keep your shoulder blade tucked down toward the middle of your back. Hold for __________ seconds. Release the stretch by letting go of the band and lowering your hands. Repeat __________ times. Complete this exercise __________ times a day. Strengthening exercises External rotation  Sit in a stable chair without armrests. Secure an exercise band to a  stable object at elbow height on your left / right side. Place a soft object, such as a folded towel or a small pillow, between your left / right upper arm and your body to move your elbow about 4 inches (10 cm) away from your side. Hold the end of the exercise band so it is tight and there is no slack. Keeping your elbow pressed against the soft object, slowly move your forearm out, away from your abdomen (external rotation). Keep your body steady so only your forearm moves. Hold for __________ seconds. Slowly return to the starting position. Repeat __________ times. Complete this exercise __________ times a day. Shoulder abduction  Sit in a stable chair without armrests, or stand up. Hold a __________ weight in your left / right hand, or hold an exercise band with both hands. Start with your arms straight down and your left / right palm facing in, toward your body. Slowly lift your left / right hand out to your side (abduction). Do not lift your hand above shoulder height unless your health care provider tells you that this is safe. Keep your arms straight. Avoid shrugging your shoulder while you do this movement. Keep your shoulder blade tucked down toward the middle of your back. Hold for __________ seconds. Slowly lower your arm, and return to the starting position. Repeat __________ times. Complete this exercise __________ times a day. Shoulder extension Sit in a stable chair without armrests, or stand up. Secure an exercise band to a stable object in front of you so it is at shoulder height. Hold one end of the exercise band in each hand. Your palms should face each other. Straighten your elbows and lift your hands up to shoulder height. Step back, away from the secured end of the exercise band, until the band is tight and there is no slack. Squeeze your shoulder blades together as you pull your hands down to the sides of your thighs (extension). Stop when your hands are straight down  by your sides. Do not let your hands go behind your body. Hold for __________ seconds. Slowly return to the starting position. Repeat __________ times. Complete this exercise __________ times a day. Shoulder row Sit in a stable chair without armrests, or stand up. Secure an exercise band to a stable object in front of you so it is at waist height. Hold one end of the exercise band in each hand. Position your palms so that your thumbs are facing the ceiling (neutral position). Bend each of your elbows to a 90-degree angle (right angle) and keep your upper arms at your sides. Step back until the band is tight and there is no slack. Slowly pull your elbows back behind you. Hold  for __________ seconds. Slowly return to the starting position. Repeat __________ times. Complete this exercise __________ times a day. Shoulder press-ups  Sit in a stable chair that has armrests. Sit upright, with your feet flat on the floor. Put your hands on the armrests so your elbows are bent and your fingers are pointing forward. Your hands should be about even with the sides of your body. Push down on the armrests and use your arms to lift yourself off the chair. Straighten your elbows and lift yourself up as much as you comfortably can. Move your shoulder blades down, and avoid letting your shoulders move up toward your ears. Keep your feet on the ground. As you get stronger, your feet should support less of your body weight as you lift yourself up. Hold for __________ seconds. Slowly lower yourself back into the chair. Repeat __________ times. Complete this exercise __________ times a day. Wall push-ups  Stand so you are facing a stable wall. Your feet should be about one arm-length away from the wall. Lean forward and place your palms on the wall at shoulder height. Keep your feet flat on the floor as you bend your elbows and lean forward toward the wall. Hold for __________ seconds. Straighten your elbows  to push yourself back to the starting position. Repeat __________ times. Complete this exercise __________ times a day. This information is not intended to replace advice given to you by your health care provider. Make sure you discuss any questions you have with your health care provider. Document Revised: 08/04/2021 Document Reviewed: 09/08/2018 Else[employer] Patient Education  [employer].

## 2022-03-19 LAB — COMPLETE METABOLIC PANEL WITH GFR
AG Ratio: 1 (calc) (ref 1.0–2.5)
ALT: 9 U/L (ref 6–29)
AST: 15 U/L (ref 10–35)
Albumin: 3.8 g/dL (ref 3.6–5.1)
Alkaline phosphatase (APISO): 49 U/L (ref 37–153)
BUN/Creatinine Ratio: 21 (calc) (ref 6–22)
BUN: 22 mg/dL (ref 7–25)
CO2: 32 mmol/L (ref 20–32)
Calcium: 10.1 mg/dL (ref 8.6–10.4)
Chloride: 101 mmol/L (ref 98–110)
Creat: 1.04 mg/dL — ABNORMAL HIGH (ref 0.60–0.95)
Globulin: 3.7 g/dL (calc) (ref 1.9–3.7)
Glucose, Bld: 80 mg/dL (ref 65–99)
Potassium: 3.9 mmol/L (ref 3.5–5.3)
Sodium: 140 mmol/L (ref 135–146)
Total Bilirubin: 0.4 mg/dL (ref 0.2–1.2)
Total Protein: 7.5 g/dL (ref 6.1–8.1)
eGFR: 54 mL/min/{1.73_m2} — ABNORMAL LOW (ref >=60)

## 2022-03-19 LAB — CBC WITH DIFFERENTIAL/PLATELET
Absolute Monocytes: 365 cells/uL (ref 200–950)
Basophils Absolute: 8 cells/uL (ref 0–200)
Basophils Relative: 0.2 %
Eosinophils Absolute: 349 cells/uL (ref 15–500)
Eosinophils Relative: 8.5 %
HCT: 37.5 % (ref 35.0–45.0)
Hemoglobin: 12.6 g/dL (ref 11.7–15.5)
Lymphs Abs: 1763 cells/uL (ref 850–3900)
MCH: 31.1 pg (ref 27.0–33.0)
MCHC: 33.6 g/dL (ref 32.0–36.0)
MCV: 92.6 fL (ref 80.0–100.0)
MPV: 9.7 fL (ref 7.5–12.5)
Monocytes Relative: 8.9 %
Neutro Abs: 1615 cells/uL (ref 1500–7800)
Neutrophils Relative %: 39.4 %
Platelets: 175 10*3/uL (ref 140–400)
RBC: 4.05 10*6/uL (ref 3.80–5.10)
RDW: 12.8 % (ref 11.0–15.0)
Total Lymphocyte: 43 %
WBC: 4.1 10*3/uL (ref 3.8–10.8)

## 2022-03-19 NOTE — Progress Notes (Signed)
CBC WNL. Creatinine is borderline elevated-trending up.  GFR is slightly low-54.  Please advise the patient to avoid the use of NSAIDs. Please forward results to PCP as requested.

## 2022-03-23 DIAGNOSIS — E785 Hyperlipidemia, unspecified: Secondary | ICD-10-CM | POA: Diagnosis not present

## 2022-03-23 DIAGNOSIS — I129 Hypertensive chronic kidney disease with stage 1 through stage 4 chronic kidney disease, or unspecified chronic kidney disease: Secondary | ICD-10-CM | POA: Diagnosis not present

## 2022-04-12 ENCOUNTER — Ambulatory Visit
Admission: RE | Admit: 2022-04-12 | Discharge: 2022-04-12 | Disposition: A | Discharge status: Home / Self Care | Payer: Medicare Other | Source: Ambulatory Visit | Attending: Family Medicine | Admitting: Family Medicine

## 2022-04-12 DIAGNOSIS — Z1231 Encounter for screening mammogram for malignant neoplasm of breast: Secondary | ICD-10-CM

## 2022-04-20 ENCOUNTER — Telehealth: Payer: Self-pay | Admitting: Pharmacist

## 2022-04-20 DIAGNOSIS — M810 Age-related osteoporosis without current pathological fracture: Secondary | ICD-10-CM

## 2022-04-20 DIAGNOSIS — Z111 Encounter for screening for respiratory tuberculosis: Secondary | ICD-10-CM

## 2022-04-20 DIAGNOSIS — Z5181 Encounter for therapeutic drug level monitoring: Secondary | ICD-10-CM

## 2022-04-20 DIAGNOSIS — Z79899 Other long term (current) drug therapy: Secondary | ICD-10-CM

## 2022-04-20 DIAGNOSIS — M0579 Rheumatoid arthritis with rheumatoid factor of multiple sites without organ or systems involvement: Secondary | ICD-10-CM

## 2022-04-20 NOTE — Addendum Note (Signed)
Addended by: Cassandria Anger on: 04/20/2022 08:22 AM   Modules accepted: Orders

## 2022-04-20 NOTE — Telephone Encounter (Signed)
Patient due for next dose for Prolia on 05/08/22. She will need labs repeated prior to dose. Order for CBC, CMP, Vitamin D in place. TB gold is not due until Oct 2023  Benefits review as of 10/21/21: Patient has coverage under traditional Medicare, which covers 80% of services received under Part B benefits.  Patient also has active coverage through [place] follows Medicare guidelines. This plan picks up remaining 20% not covered by Part B, and also provides reimbursement of up to 100% of any deductible. Per benefits summary, no pre-certification is required by [facility] patient to discuss. Unable to reach. Left VM requesting return call  Knox Saliva, PharmD, MPH, BCPS, CPP Clinical Pharmacist (Rheumatology and Pulmonology)

## 2022-04-23 DIAGNOSIS — I129 Hypertensive chronic kidney disease with stage 1 through stage 4 chronic kidney disease, or unspecified chronic kidney disease: Secondary | ICD-10-CM | POA: Diagnosis not present

## 2022-04-23 DIAGNOSIS — E785 Hyperlipidemia, unspecified: Secondary | ICD-10-CM | POA: Diagnosis not present

## 2022-04-28 DIAGNOSIS — Z0000 Encounter for general adult medical examination without abnormal findings: Secondary | ICD-10-CM | POA: Diagnosis not present

## 2022-04-28 DIAGNOSIS — Z0189 Encounter for other specified special examinations: Secondary | ICD-10-CM | POA: Diagnosis not present

## 2022-04-28 DIAGNOSIS — Z139 Encounter for screening, unspecified: Secondary | ICD-10-CM | POA: Diagnosis not present

## 2022-04-28 DIAGNOSIS — Z136 Encounter for screening for cardiovascular disorders: Secondary | ICD-10-CM | POA: Diagnosis not present

## 2022-04-28 DIAGNOSIS — Z6825 Body mass index (BMI) 25.0-25.9, adult: Secondary | ICD-10-CM | POA: Diagnosis not present

## 2022-04-28 DIAGNOSIS — Z1339 Encounter for screening examination for other mental health and behavioral disorders: Secondary | ICD-10-CM | POA: Diagnosis not present

## 2022-04-28 DIAGNOSIS — Z1331 Encounter for screening for depression: Secondary | ICD-10-CM | POA: Diagnosis not present

## 2022-04-28 NOTE — Telephone Encounter (Signed)
ATC patient regarding Prolia and updating labwork. Unable to reach. Left VM requesting return call  Knox Saliva, PharmD, MPH, BCPS, CPP Clinical Pharmacist (Rheumatology and Pulmonology)

## 2022-05-05 NOTE — Telephone Encounter (Signed)
Spoke w patient regarding Prolia labwork. She will plan to stop by [facility] in [place] to complete labwork. Orders released for CBC, CMP, Vitamin D, and TB gold.  Knox Saliva, PharmD, MPH, BCPS, CPP Clinical Pharmacist (Rheumatology and Pulmonology)

## 2022-05-06 DIAGNOSIS — Z5181 Encounter for therapeutic drug level monitoring: Secondary | ICD-10-CM | POA: Diagnosis not present

## 2022-05-06 DIAGNOSIS — Z111 Encounter for screening for respiratory tuberculosis: Secondary | ICD-10-CM | POA: Diagnosis not present

## 2022-05-06 DIAGNOSIS — M810 Age-related osteoporosis without current pathological fracture: Secondary | ICD-10-CM | POA: Diagnosis not present

## 2022-05-06 DIAGNOSIS — M0579 Rheumatoid arthritis with rheumatoid factor of multiple sites without organ or systems involvement: Secondary | ICD-10-CM | POA: Diagnosis not present

## 2022-05-06 DIAGNOSIS — Z79899 Other long term (current) drug therapy: Secondary | ICD-10-CM | POA: Diagnosis not present

## 2022-05-07 LAB — CBC WITH DIFFERENTIAL/PLATELET
Basophils Absolute: 0 10*3/uL (ref 0.0–0.2)
Basos: 0 %
EOS (ABSOLUTE): 0.5 10*3/uL — ABNORMAL HIGH (ref 0.0–0.4)
Eos: 10 %
Hematocrit: 37.3 % (ref 34.0–46.6)
Hemoglobin: 12.8 g/dL (ref 11.1–15.9)
Immature Grans (Abs): 0 10*3/uL (ref 0.0–0.1)
Immature Granulocytes: 0 %
Lymphocytes Absolute: 1.8 10*3/uL (ref 0.7–3.1)
Lymphs: 37 %
MCH: 31.1 pg (ref 26.6–33.0)
MCHC: 34.3 g/dL (ref 31.5–35.7)
MCV: 91 fL (ref 79–97)
Monocytes Absolute: 0.4 10*3/uL (ref 0.1–0.9)
Monocytes: 8 %
Neutrophils Absolute: 2.2 10*3/uL (ref 1.4–7.0)
Neutrophils: 45 %
Platelets: 180 10*3/uL (ref 150–450)
RBC: 4.11 x10E6/uL (ref 3.77–5.28)
RDW: 11.6 % — ABNORMAL LOW (ref 11.7–15.4)
WBC: 4.9 10*3/uL (ref 3.4–10.8)

## 2022-05-07 LAB — COMPREHENSIVE METABOLIC PANEL
ALT: 5 IU/L (ref 0–32)
AST: 17 IU/L (ref 0–40)
Albumin/Globulin Ratio: 1 — ABNORMAL LOW (ref 1.2–2.2)
Albumin: 4 g/dL (ref 3.7–4.7)
Alkaline Phosphatase: 69 IU/L (ref 44–121)
BUN/Creatinine Ratio: 17 (ref 12–28)
BUN: 21 mg/dL (ref 8–27)
Bilirubin Total: 0.5 mg/dL (ref 0.0–1.2)
CO2: 27 mmol/L (ref 20–29)
Calcium: 9.7 mg/dL (ref 8.7–10.3)
Chloride: 98 mmol/L (ref 96–106)
Creatinine, Ser: 1.22 mg/dL — ABNORMAL HIGH (ref 0.57–1.00)
Globulin, Total: 4 g/dL (ref 1.5–4.5)
Glucose: 107 mg/dL — ABNORMAL HIGH (ref 70–99)
Potassium: 3.4 mmol/L — ABNORMAL LOW (ref 3.5–5.2)
Sodium: 139 mmol/L (ref 134–144)
Total Protein: 8 g/dL (ref 6.0–8.5)
eGFR: 44 mL/min/{1.73_m2} — ABNORMAL LOW (ref >59)

## 2022-05-07 LAB — VITAMIN D 25 HYDROXY (VIT D DEFICIENCY, FRACTURES): Vit D, 25-Hydroxy: 79.6 ng/mL (ref 30.0–100.0)

## 2022-05-07 NOTE — Progress Notes (Signed)
Creatinine is elevated most likely due to the use of diuretics.  CBC is normal.  Vitamin D is normal.

## 2022-05-10 ENCOUNTER — Other Ambulatory Visit: Payer: Self-pay | Admitting: Pharmacist

## 2022-05-10 DIAGNOSIS — M810 Age-related osteoporosis without current pathological fracture: Secondary | ICD-10-CM

## 2022-05-10 NOTE — Telephone Encounter (Signed)
Order for Prolia placed with [facility]. Patient advised of phone number and she will call to schedule  Knox Saliva, PharmD, MPH, BCPS, CPP Clinical Pharmacist (Rheumatology and Pulmonology)

## 2022-05-10 NOTE — Progress Notes (Signed)
Next dose not yet scheduled for Prolia SQ. She is due on or after 05/08/22 Diagnosis: age-related osteoporosis  Dose: '60mg'$  SQ every 6 months  Last Clinic Visit: 03/18/22 Next Clinic Visit: 09/09/22  Last dose: 11/09/21  Labs: 05/06/22 Vitamin D, CBC, CMP - Creatinine is elevated most likely due to the use of diuretics.  CBC is normal.  Vitamin D is normal.   Orders placed for Prolia SQ x 1 dose. No premedications required.  Called patient and provided with phone number for: [facility] [phone]) [place]  Will follow-up to ensured scheduled and completed  Knox Saliva, PharmD, MPH, BCPS, CPP Clinical Pharmacist (Rheumatology and Pulmonology)

## 2022-05-11 DIAGNOSIS — J069 Acute upper respiratory infection, unspecified: Secondary | ICD-10-CM | POA: Diagnosis not present

## 2022-05-11 DIAGNOSIS — R059 Cough, unspecified: Secondary | ICD-10-CM | POA: Diagnosis not present

## 2022-05-11 DIAGNOSIS — Z1231 Encounter for screening mammogram for malignant neoplasm of breast: Secondary | ICD-10-CM | POA: Diagnosis not present

## 2022-05-11 DIAGNOSIS — Z20822 Contact with and (suspected) exposure to covid-19: Secondary | ICD-10-CM | POA: Diagnosis not present

## 2022-05-11 LAB — QUANTIFERON-TB GOLD PLUS
QuantiFERON Mitogen Value: >10 IU/mL
QuantiFERON Nil Value: 0.02 IU/mL
QuantiFERON TB1 Ag Value: 0.02 IU/mL
QuantiFERON TB2 Ag Value: 0.02 IU/mL
QuantiFERON-TB Gold Plus: NEGATIVE

## 2022-05-11 NOTE — Progress Notes (Signed)
CBC is normal.  CMP shows low GFR.  Please forward results to her PCP.  TB Gold is negative.  Vitamin D is high normal.

## 2022-05-12 NOTE — Progress Notes (Signed)
Prolia scheduled for 05/14/22 at [facility]. Will f/u to ensure completed  Knox Saliva, PharmD, MPH, BCPS, CPP Clinical Pharmacist (Rheumatology and Pulmonology)

## 2022-05-14 ENCOUNTER — Ambulatory Visit (HOSPITAL_COMMUNITY)
Admission: RE | Admit: 2022-05-14 | Discharge: 2022-05-14 | Disposition: A | Discharge status: Home / Self Care | Payer: Medicare HMO | Source: Ambulatory Visit | Attending: Rheumatology | Admitting: Rheumatology

## 2022-05-14 DIAGNOSIS — M810 Age-related osteoporosis without current pathological fracture: Secondary | ICD-10-CM | POA: Diagnosis not present

## 2022-05-14 MED ORDER — DENOSUMAB 60 MG/ML ~~LOC~~ SOSY
PREFILLED_SYRINGE | SUBCUTANEOUS | Status: AC
Start: 2022-05-14 — End: 2022-05-14
  Filled 2022-05-14: qty 1

## 2022-05-14 MED ORDER — DENOSUMAB 60 MG/ML ~~LOC~~ SOSY
60.0000 mg | PREFILLED_SYRINGE | Freq: Once | SUBCUTANEOUS | Status: AC
  Administered 2022-05-14: 60 mg via SUBCUTANEOUS

## 2022-05-23 DIAGNOSIS — I129 Hypertensive chronic kidney disease with stage 1 through stage 4 chronic kidney disease, or unspecified chronic kidney disease: Secondary | ICD-10-CM | POA: Diagnosis not present

## 2022-05-23 DIAGNOSIS — E785 Hyperlipidemia, unspecified: Secondary | ICD-10-CM | POA: Diagnosis not present

## 2022-06-01 DIAGNOSIS — Z961 Presence of intraocular lens: Secondary | ICD-10-CM | POA: Diagnosis not present

## 2022-06-01 DIAGNOSIS — Z0100 Encounter for examination of eyes and vision without abnormal findings: Secondary | ICD-10-CM | POA: Diagnosis not present

## 2022-06-01 DIAGNOSIS — H52223 Regular astigmatism, bilateral: Secondary | ICD-10-CM | POA: Diagnosis not present

## 2022-06-08 ENCOUNTER — Other Ambulatory Visit: Payer: Self-pay | Admitting: *Deleted

## 2022-06-08 ENCOUNTER — Telehealth: Payer: Self-pay | Admitting: Rheumatology

## 2022-06-08 DIAGNOSIS — M0579 Rheumatoid arthritis with rheumatoid factor of multiple sites without organ or systems involvement: Secondary | ICD-10-CM

## 2022-06-08 MED ORDER — ENBREL SURECLICK 50 MG/ML ~~LOC~~ SOAJ: 50.0000 mg | SUBCUTANEOUS | 0 refills | Status: DC

## 2022-06-08 NOTE — Telephone Encounter (Signed)
Patient called requesting prescription refill for Enbrel.

## 2022-06-08 NOTE — Telephone Encounter (Signed)
Next Visit: 09/09/2022  Last Visit: 03/18/2022  Last Fill: 03/18/2022  AO:[id number] rheumatoid arthritis of multiple sites  Current Dose per office note 2/95/2841: Enbrel Sureclick 50 mg sq every 7 days  Labs: 05/06/2022 Creatinine is elevated most likely due to the use of diuretics.  CBC is normal.  Vitamin D is normal. CBC is normal.  CMP shows low GFR.  Please forward results to her PCP.  Vitamin D is high normal.  TB Gold: 05/06/2022  TB Gold is negative  Okay to refill Enbrel?

## 2022-06-10 DIAGNOSIS — Z23 Encounter for immunization: Secondary | ICD-10-CM | POA: Diagnosis not present

## 2022-06-10 DIAGNOSIS — Z0000 Encounter for general adult medical examination without abnormal findings: Secondary | ICD-10-CM | POA: Diagnosis not present

## 2022-06-10 DIAGNOSIS — Z6826 Body mass index (BMI) 26.0-26.9, adult: Secondary | ICD-10-CM | POA: Diagnosis not present

## 2022-06-23 DIAGNOSIS — I129 Hypertensive chronic kidney disease with stage 1 through stage 4 chronic kidney disease, or unspecified chronic kidney disease: Secondary | ICD-10-CM | POA: Diagnosis not present

## 2022-06-23 DIAGNOSIS — E785 Hyperlipidemia, unspecified: Secondary | ICD-10-CM | POA: Diagnosis not present

## 2022-07-12 DIAGNOSIS — R7303 Prediabetes: Secondary | ICD-10-CM | POA: Diagnosis not present

## 2022-07-12 DIAGNOSIS — E785 Hyperlipidemia, unspecified: Secondary | ICD-10-CM | POA: Diagnosis not present

## 2022-07-12 DIAGNOSIS — I129 Hypertensive chronic kidney disease with stage 1 through stage 4 chronic kidney disease, or unspecified chronic kidney disease: Secondary | ICD-10-CM | POA: Diagnosis not present

## 2022-07-19 DIAGNOSIS — Z6827 Body mass index (BMI) 27.0-27.9, adult: Secondary | ICD-10-CM | POA: Diagnosis not present

## 2022-07-19 DIAGNOSIS — E785 Hyperlipidemia, unspecified: Secondary | ICD-10-CM | POA: Diagnosis not present

## 2022-07-19 DIAGNOSIS — N183 Chronic kidney disease, stage 3 unspecified: Secondary | ICD-10-CM | POA: Diagnosis not present

## 2022-07-19 DIAGNOSIS — R7303 Prediabetes: Secondary | ICD-10-CM | POA: Diagnosis not present

## 2022-07-19 DIAGNOSIS — I129 Hypertensive chronic kidney disease with stage 1 through stage 4 chronic kidney disease, or unspecified chronic kidney disease: Secondary | ICD-10-CM | POA: Diagnosis not present

## 2022-07-23 DIAGNOSIS — I129 Hypertensive chronic kidney disease with stage 1 through stage 4 chronic kidney disease, or unspecified chronic kidney disease: Secondary | ICD-10-CM | POA: Diagnosis not present

## 2022-07-23 DIAGNOSIS — E785 Hyperlipidemia, unspecified: Secondary | ICD-10-CM | POA: Diagnosis not present

## 2022-08-06 ENCOUNTER — Telehealth: Payer: Self-pay | Admitting: Pharmacist

## 2022-08-06 NOTE — Telephone Encounter (Signed)
Submitted a Prior Authorization RENEWAL request to [facility] for ENBREL via CoverMyMeds. Will update once we receive a response.  Key: [id number]  Knox Saliva, PharmD, MPH, BCPS, CPP Clinical Pharmacist (Rheumatology and Pulmonology)

## 2022-08-06 NOTE — Telephone Encounter (Signed)
Received notification from [facility] regarding a prior authorization for ENBREL. Authorization has been APPROVED from 08/06/2022 to 08/23/2023.   Authorization # [id number]  Knox Saliva, PharmD, MPH, BCPS, CPP Clinical Pharmacist (Rheumatology and Pulmonology)

## 2022-08-23 DIAGNOSIS — I129 Hypertensive chronic kidney disease with stage 1 through stage 4 chronic kidney disease, or unspecified chronic kidney disease: Secondary | ICD-10-CM | POA: Diagnosis not present

## 2022-08-23 DIAGNOSIS — E785 Hyperlipidemia, unspecified: Secondary | ICD-10-CM | POA: Diagnosis not present

## 2022-08-27 NOTE — Progress Notes (Signed)
Office Visit Note  Patient: Andrea [name]             Date of Birth: 1940/12/30           MRN: [id number]             PCP: Marco Collie, MD Referring: Marco Collie, MD Visit Date: 09/09/2022 Occupation: '@GUAROCC'$ @  Subjective:  Medication management  History of Present Illness: Andrea [name] is a 82 y.o. female with history of rheumatoid arthritis, osteoarthritis and osteoporosis.  She has not experienced a flare of rheumatoid arthritis since the last visit.  She states her rheumatoid arthritis is well-controlled on Enbrel weekly injections.  She had no interruption in her treatment of Enbrel.  She has been tolerating Enbrel without any side effects.  She continues to have some discomfort in her knee joints.  She has off-and-on discomfort in her lower back.  She states for the last 2 weeks she has been having some discomfort in her lower back.  She has been getting Prolia injections at the hospital.  Her last Prolia injection was on May 14, 2022.  She has not had any side effects from Prolia injections.  She takes calcium and vitamin D on a regular basis.    Activities of Daily Living:  Patient reports morning stiffness for 0 minutes.   Patient Reports nocturnal pain.  Difficulty dressing/grooming: Denies Difficulty climbing stairs: Reports Difficulty getting out of chair: Reports Difficulty using hands for taps, buttons, cutlery, and/or writing: Reports  Review of Systems  Constitutional:  Negative for fatigue.  HENT:  Negative for mouth sores and mouth dryness.   Eyes:  Negative for dryness.  Respiratory:  Negative for shortness of breath.   Cardiovascular:  Negative for chest pain and palpitations.  Gastrointestinal:  Positive for constipation. Negative for blood in stool and diarrhea.  Endocrine: Negative for increased urination.  Genitourinary:  Negative for involuntary urination.  Musculoskeletal:  Positive for joint pain, gait problem, joint pain and muscle  tenderness. Negative for joint swelling, myalgias, muscle weakness, morning stiffness and myalgias.  Skin:  Negative for color change, rash, hair loss and sensitivity to sunlight.  Allergic/Immunologic: Negative for susceptible to infections.  Neurological:  Positive for headaches. Negative for dizziness.  Hematological:  Negative for swollen glands.  Psychiatric/Behavioral:  Positive for sleep disturbance. Negative for depressed mood. The patient is not nervous/anxious.     PMFS History:  Patient Active Problem List   Diagnosis Date Noted   History of hypertension 02/08/2017   History of gastroesophageal reflux (GERD) 02/08/2017   High risk medication use 12/27/2016   Vitamin D deficiency 12/27/2016   History of total knee replacement, bilateral 08/27/2016   Age-related osteoporosis without current pathological fracture 08/27/2016   Seropositive rheumatoid arthritis of multiple sites ([place]) 06/21/2016    Past Medical History:  Diagnosis Date   Arthritis    Cataract    GERD (gastroesophageal reflux disease)    History of colon polyps    Hypercholesterolemia    Hypertension    Osteoporosis     Family History  Problem Relation Age of Onset   Kidney disease Mother    Cancer Father    Bladder Cancer Daughter    Rheum arthritis Daughter    Rheum arthritis Daughter    Stroke Brother    Lung disease Brother    Colon cancer Neg Hx    Esophageal cancer Neg Hx    Rectal cancer Neg Hx    Stomach cancer Neg  Hx    Breast cancer Neg Hx    Past Surgical History:  Procedure Laterality Date   CARPAL TUNNEL RELEASE     COLONOSCOPY  12/23/2010   Mild pancolonic diverticulosis. Small internal hemorroids   COLONOSCOPY  08/2019   ESOPHAGOGASTRODUODENOSCOPY  05/09/2013   Hiatal Hernia. Presbyesophagus. Minimal antral gastritis.   GUM SURGERY     JOINT REPLACEMENT     BIL knee    KNEE ARTHROPLASTY     Right knee 2012, Left knee total 2009   TUBAL LIGATION Bilateral    UPPER GI  ENDOSCOPY  08/2019   Social History   Social History Narrative   Not on file   Immunization History  Administered Date(s) Administered   Moderna Sars-Covid-2 Vaccination 09/14/2019, 10/12/2019, 07/11/2020     Objective: Vital Signs: BP 130/78 (BP Location: Left Arm, Patient Position: Sitting, Cuff Size: Large)   Pulse (!) 51   Resp 12   Ht '5\' 2"'$  (1.575 m)   Wt 155 lb 12.8 oz (70.7 kg)   BMI 28.50 kg/m    Physical Exam Vitals and nursing note reviewed.  Constitutional:      Appearance: She is well-developed.  HENT:     Head: Normocephalic and atraumatic.  Eyes:     Conjunctiva/sclera: Conjunctivae normal.  Cardiovascular:     Rate and Rhythm: Normal rate and regular rhythm.     Heart sounds: Normal heart sounds.  Pulmonary:     Effort: Pulmonary effort is normal.     Breath sounds: Normal breath sounds.  Abdominal:     General: Bowel sounds are normal.     Palpations: Abdomen is soft.  Musculoskeletal:     Cervical back: Normal range of motion.  Lymphadenopathy:     Cervical: No cervical adenopathy.  Skin:    General: Skin is warm and dry.     Capillary Refill: Capillary refill takes less than 2 seconds.  Neurological:     Mental Status: She is alert and oriented to person, place, and time.  Psychiatric:        Behavior: Behavior normal.      Musculoskeletal Exam: She had good range of motion of the cervical spine.  She has some discomfort range of motion of her lumbar spine.  She had no point tenderness.  Shoulders, elbows, wrist joints with good range of motion.  She had bilateral ulnar deviation with synovial thickening over all the MCP joints.  No synovitis was noted.  Hip joints and knee joints with good range of motion.  There was no tenderness over ankles or MTPs.  CDAI Exam: CDAI Score: -- Patient Global: 5 mm; Provider Global: 3 mm Swollen: --; Tender: -- Joint Exam 09/09/2022   No joint exam has been documented for this visit   There is currently  no information documented on the homunculus. Go to the Rheumatology activity and complete the homunculus joint exam.  Investigation: No additional findings.  Imaging: No results found.  Recent Labs: Lab Results  Component Value Date   WBC 4.9 05/06/2022   HGB 12.8 05/06/2022   PLT 180 05/06/2022   NA 139 05/06/2022   K 3.4 (L) 05/06/2022   CL 98 05/06/2022   CO2 27 05/06/2022   GLUCOSE 107 (H) 05/06/2022   BUN 21 05/06/2022   CREATININE 1.22 (H) 05/06/2022   BILITOT 0.5 05/06/2022   ALKPHOS 69 05/06/2022   AST 17 05/06/2022   ALT 5 05/06/2022   PROT 8.0 05/06/2022   ALBUMIN 4.0 05/06/2022  CALCIUM 9.7 05/06/2022   GFRAA 37 (L) 09/17/2020   QFTBGOLDPLUS Negative 05/06/2022    Speciality Comments: TB Gold: 03/25/17 Neg [place] 07/18-10/20 Reclast November 28, 2012, June 06, 2015, July 06, 2016, June 29, 2019, August 05, 2020  Procedures:  No procedures performed Allergies: Patient has no known allergies.   Assessment / Plan:     Visit Diagnoses: Seropositive rheumatoid arthritis of multiple sites ([place]) - +RF,+anti CCP: Patient had no synovitis on examination.  She has synovial thickening over MCP joints.  She continues to have discomfort in her knee joints in her lower back.  She has been taking Enbrel on a regular basis without interruption.  High risk medication use - Enbrel Sureclick 50 mg sq every 7 days -Labs obtained on May 06, 2022 were reviewed which were within normal limits except creatinine was elevated at 1.22.  TB Gold was negative on May 06, 2022.  Will get labs today.  She was advised to get labs every 3 months.  Annual skin examination to screen for skin cancer was advised.  Information getting in addition was placed in the AVS.  She was advised to hold Enbrel if she develops an infection and resume after the infection resolves.  Plan: CBC with Differential/Platelet, COMPLETE METABOLIC PANEL WITH GFR  Subdeltoid bursitis of right shoulder  joint-improved she had good range of motion today.  Trochanteric bursitis, right hip-she has intermittent pain in the right trochanteric region.  IT band stretches were discussed.  History of total knee replacement, bilateral-she continues to have some discomfort in her knee joints.  Chronic midline low back pain without sciatica-she has been experiencing some lower back pain for the last week.  She states she has intermittent discomfort in her lower back.  She had no radiculopathy or no point tenderness.  I handout on back exercises was given.  I advised her to contact me if she does not get relief.  Age-related osteoporosis without current pathological fracture - July 05 2021 DEXA scan showed T score -3.1, BMD 0.50 7,1/3 left left distal radius.  3% improvement from 2020. Last prolia injection:05/14/2022.  She been tolerating Prolia well.  Vitamin D was normal at 79.6 in May 06, 2022.  History of vitamin D deficiency  History of hypertension-blood pressure was normal at 130/78.  History of gastroesophageal reflux (GERD)  Orders: Orders Placed This Encounter  Procedures   CBC with Differential/Platelet   COMPLETE METABOLIC PANEL WITH GFR   No orders of the defined types were placed in this encounter.   Follow-Up Instructions: Return in about 5 months (around 02/08/2023) for Rheumatoid arthritis.   Bo Merino, MD  Note - This record has been created using Editor, commissioning.  Chart creation errors have been sought, but may not always  have been located. Such creation errors do not reflect on  the standard of medical care.

## 2022-09-09 ENCOUNTER — Telehealth: Payer: Self-pay | Admitting: Pharmacist

## 2022-09-09 ENCOUNTER — Encounter: Payer: Self-pay | Admitting: Rheumatology

## 2022-09-09 ENCOUNTER — Ambulatory Visit: Payer: Medicare HMO | Attending: Rheumatology | Admitting: Rheumatology

## 2022-09-09 VITALS — BP 130/78 | HR 51 | Resp 12 | Ht 62.0 in | Wt 155.8 lb

## 2022-09-09 DIAGNOSIS — M7551 Bursitis of right shoulder: Secondary | ICD-10-CM | POA: Diagnosis not present

## 2022-09-09 DIAGNOSIS — M7061 Trochanteric bursitis, right hip: Secondary | ICD-10-CM | POA: Diagnosis not present

## 2022-09-09 DIAGNOSIS — Z8719 Personal history of other diseases of the digestive system: Secondary | ICD-10-CM

## 2022-09-09 DIAGNOSIS — M0579 Rheumatoid arthritis with rheumatoid factor of multiple sites without organ or systems involvement: Secondary | ICD-10-CM

## 2022-09-09 DIAGNOSIS — M810 Age-related osteoporosis without current pathological fracture: Secondary | ICD-10-CM

## 2022-09-09 DIAGNOSIS — Z8679 Personal history of other diseases of the circulatory system: Secondary | ICD-10-CM | POA: Diagnosis not present

## 2022-09-09 DIAGNOSIS — Z79899 Other long term (current) drug therapy: Secondary | ICD-10-CM

## 2022-09-09 DIAGNOSIS — M545 Low back pain, unspecified: Secondary | ICD-10-CM

## 2022-09-09 DIAGNOSIS — Z8639 Personal history of other endocrine, nutritional and metabolic disease: Secondary | ICD-10-CM | POA: Diagnosis not present

## 2022-09-09 DIAGNOSIS — G8929 Other chronic pain: Secondary | ICD-10-CM

## 2022-09-09 DIAGNOSIS — Z5181 Encounter for therapeutic drug level monitoring: Secondary | ICD-10-CM

## 2022-09-09 DIAGNOSIS — Z96653 Presence of artificial knee joint, bilateral: Secondary | ICD-10-CM | POA: Diagnosis not present

## 2022-09-09 NOTE — Telephone Encounter (Signed)
Patient had OV today and brought Enbrel PAP renewal for 2024. Provider portion placed in Dr. Arlean Hopping folder for signature.  Knox Saliva, PharmD, MPH, BCPS, CPP Clinical Pharmacist (Rheumatology and Pulmonology)

## 2022-09-09 NOTE — Patient Instructions (Addendum)
Standing Labs We placed an order today for your standing lab work.   Please have your standing labs drawn in April and every 3 months  Please have your labs drawn 2 weeks prior to your appointment so that the provider can discuss your lab results at your appointment.  Please note that you may see your imaging and lab results in [place] before we have reviewed them. We will contact you once all results are reviewed. Please allow our office up to 72 hours to thoroughly review all of the results before contacting the office for clarification of your results.  Lab hours are:   Monday through Thursday from 8:00 am -12:30 pm and 1:00 pm-5:00 pm and Friday from 8:00 am-12:00 pm.  Please be advised, all patients with office appointments requiring lab work will take precedent over walk-in lab work.   Labs are drawn by [facility]. Please bring your co-pay at the time of your lab draw.  You may receive a bill from [facility] for your lab work.  Please note if you are on Hydroxychloroquine and and an order has been placed for a Hydroxychloroquine level, you will need to have it drawn 4 hours or more after your last dose.  If you wish to have your labs drawn at another location, please call the office 24 hours in advance so we can fax the orders.  The office is located at [address], [place], [place], [place] [address] No appointment is necessary.    If you have any questions regarding directions or hours of operation,  please call [phone].   As a reminder, please drink plenty of water prior to coming for your lab work. Thanks!   Vaccines You are taking a medication(s) that can suppress your immune system.  The following immunizations are recommended: Flu annually Covid-19  RSV Td/Tdap (tetanus, diphtheria, pertussis) every 10 years Pneumonia (Prevnar 15 then Pneumovax 23 at least 1 year apart.  Alternatively, can take Prevnar 20 without needing additional dose) Shingrix: 2 doses from 4  weeks to 6 months apart  Please check with your PCP to make sure you are up to date.   If you have signs or symptoms of an infection or start antibiotics: First, call your PCP for workup of your infection. Hold your medication through the infection, until you complete your antibiotics, and until symptoms resolve if you take the following: Injectable medication (Actemra, Benlysta, Cimzia, Cosentyx, Enbrel, Humira, Kevzara, Orencia, Remicade, Simponi, Stelara, Taltz, Tremfya) Methotrexate Leflunomide (Arava) Mycophenolate (Cellcept) Roma Kayser, or Rinvoq  Get an annual skin examination to screen for skin cancer while you are on Enbrel Back Exercises The following exercises strengthen the muscles that help to support the trunk (torso) and back. They also help to keep the lower back flexible. Doing these exercises can help to prevent or lessen existing low back pain. If you have back pain or discomfort, try doing these exercises 2-3 times each day or as told by your health care provider. As your pain improves, do them once each day, but increase the number of times that you repeat the steps for each exercise (do more repetitions). To prevent the recurrence of back pain, continue to do these exercises once each day or as told by your health care provider. Do exercises exactly as told by your health care provider and adjust them as directed. It is normal to feel mild stretching, pulling, tightness, or discomfort as you do these exercises, but you should stop right away if you  feel sudden pain or your pain gets worse. Exercises Single knee to chest Repeat these steps 3-5 times for each leg: Lie on your back on a firm bed or the floor with your legs extended. Bring one knee to your chest. Your other leg should stay extended and in contact with the floor. Hold your knee in place by grabbing your knee or thigh with both hands and hold. Pull on your knee until you feel a gentle stretch in your  lower back or buttocks. Hold the stretch for 10-30 seconds. Slowly release and straighten your leg.  Pelvic tilt Repeat these steps 5-10 times: Lie on your back on a firm bed or the floor with your legs extended. Bend your knees so they are pointing toward the ceiling and your feet are flat on the floor. Tighten your lower abdominal muscles to press your lower back against the floor. This motion will tilt your pelvis so your tailbone points up toward the ceiling instead of pointing to your feet or the floor. With gentle tension and even breathing, hold this position for 5-10 seconds.  Cat-cow Repeat these steps until your lower back becomes more flexible: Get into a hands-and-knees position on a firm bed or the floor. Keep your hands under your shoulders, and keep your knees under your hips. You may place padding under your knees for comfort. Let your head hang down toward your chest. Contract your abdominal muscles and point your tailbone toward the floor so your lower back becomes rounded like the back of a cat. Hold this position for 5 seconds. Slowly lift your head, let your abdominal muscles relax, and point your tailbone up toward the ceiling so your back forms a sagging arch like the back of a cow. Hold this position for 5 seconds.  Press-ups Repeat these steps 5-10 times: Lie on your abdomen (face-down) on a firm bed or the floor. Place your palms near your head, about shoulder-width apart. Keeping your back as relaxed as possible and keeping your hips on the floor, slowly straighten your arms to raise the top half of your body and lift your shoulders. Do not use your back muscles to raise your upper torso. You may adjust the placement of your hands to make yourself more comfortable. Hold this position for 5 seconds while you keep your back relaxed. Slowly return to lying flat on the floor.  Bridges Repeat these steps 10 times: Lie on your back on a firm bed or the floor. Bend  your knees so they are pointing toward the ceiling and your feet are flat on the floor. Your arms should be flat at your sides, next to your body. Tighten your buttocks muscles and lift your buttocks off the floor until your waist is at almost the same height as your knees. You should feel the muscles working in your buttocks and the back of your thighs. If you do not feel these muscles, slide your feet 1-2 inches (2.5-5 cm) farther away from your buttocks. Hold this position for 3-5 seconds. Slowly lower your hips to the starting position, and allow your buttocks muscles to relax completely. If this exercise is too easy, try doing it with your arms crossed over your chest. Abdominal crunches Repeat these steps 5-10 times: Lie on your back on a firm bed or the floor with your legs extended. Bend your knees so they are pointing toward the ceiling and your feet are flat on the floor. Cross your arms over your chest. Tip  your chin slightly toward your chest without bending your neck. Tighten your abdominal muscles and slowly raise your torso high enough to lift your shoulder blades a tiny bit off the floor. Avoid raising your torso higher than that because it can put too much stress on your lower back and does not help to strengthen your abdominal muscles. Slowly return to your starting position.  Back lifts Repeat these steps 5-10 times: Lie on your abdomen (face-down) with your arms at your sides, and rest your forehead on the floor. Tighten the muscles in your legs and your buttocks. Slowly lift your chest off the floor while you keep your hips pressed to the floor. Keep the back of your head in line with the curve in your back. Your eyes should be looking at the floor. Hold this position for 3-5 seconds. Slowly return to your starting position.  Contact a health care provider if: Your back pain or discomfort gets much worse when you do an exercise. Your worsening back pain or discomfort does  not lessen within 2 hours after you exercise. If you have any of these problems, stop doing these exercises right away. Do not do them again unless your health care provider says that you can. Get help right away if: You develop sudden, severe back pain. If this happens, stop doing the exercises right away. Do not do them again unless your health care provider says that you can. This information is not intended to replace advice given to you by your health care provider. Make sure you discuss any questions you have with your health care provider. Document Revised: 02/03/2021 Document Reviewed: 10/22/2020 Elsevier Patient Education  [place].

## 2022-09-10 LAB — COMPLETE METABOLIC PANEL WITH GFR
AG Ratio: 0.9 (calc) — ABNORMAL LOW (ref 1.0–2.5)
ALT: 7 U/L (ref 6–29)
AST: 16 U/L (ref 10–35)
Albumin: 3.8 g/dL (ref 3.6–5.1)
Alkaline phosphatase (APISO): 51 U/L (ref 37–153)
BUN/Creatinine Ratio: 26 (calc) — ABNORMAL HIGH (ref 6–22)
BUN: 25 mg/dL (ref 7–25)
CO2: 29 mmol/L (ref 20–32)
Calcium: 9.9 mg/dL (ref 8.6–10.4)
Chloride: 100 mmol/L (ref 98–110)
Creat: 0.98 mg/dL — ABNORMAL HIGH (ref 0.60–0.95)
Globulin: 4.1 g/dL (calc) — ABNORMAL HIGH (ref 1.9–3.7)
Glucose, Bld: 78 mg/dL (ref 65–99)
Potassium: 3.9 mmol/L (ref 3.5–5.3)
Sodium: 139 mmol/L (ref 135–146)
Total Bilirubin: 0.4 mg/dL (ref 0.2–1.2)
Total Protein: 7.9 g/dL (ref 6.1–8.1)
eGFR: 57 mL/min/{1.73_m2} — ABNORMAL LOW (ref >=60)

## 2022-09-10 LAB — CBC WITH DIFFERENTIAL/PLATELET
Absolute Monocytes: 323 cells/uL (ref 200–950)
Basophils Absolute: 29 cells/uL (ref 0–200)
Basophils Relative: 0.7 %
Eosinophils Absolute: 416 cells/uL (ref 15–500)
Eosinophils Relative: 9.9 %
HCT: 36.9 % (ref 35.0–45.0)
Hemoglobin: 12.5 g/dL (ref 11.7–15.5)
Lymphs Abs: 1634 cells/uL (ref 850–3900)
MCH: 31.4 pg (ref 27.0–33.0)
MCHC: 33.9 g/dL (ref 32.0–36.0)
MCV: 92.7 fL (ref 80.0–100.0)
MPV: 10.1 fL (ref 7.5–12.5)
Monocytes Relative: 7.7 %
Neutro Abs: 1798 cells/uL (ref 1500–7800)
Neutrophils Relative %: 42.8 %
Platelets: 186 10*3/uL (ref 140–400)
RBC: 3.98 10*6/uL (ref 3.80–5.10)
RDW: 12.2 % (ref 11.0–15.0)
Total Lymphocyte: 38.9 %
WBC: 4.2 10*3/uL (ref 3.8–10.8)

## 2022-09-10 NOTE — Progress Notes (Signed)
CBC is normal.  GFR is low and stable.

## 2022-09-13 NOTE — Telephone Encounter (Signed)
Submitted Patient Assistance RENEWAL Application to [employer] for ENBREL along with provider portion,patient portion, PA, med list, insurance card copy, and income documents. Will update patient when we receive a response.  Fax# [phone] Phone# [phone]  Knox Saliva, PharmD, MPH, BCPS, CPP Clinical Pharmacist (Rheumatology and Pulmonology)

## 2022-09-23 DIAGNOSIS — E785 Hyperlipidemia, unspecified: Secondary | ICD-10-CM | POA: Diagnosis not present

## 2022-09-23 DIAGNOSIS — I129 Hypertensive chronic kidney disease with stage 1 through stage 4 chronic kidney disease, or unspecified chronic kidney disease: Secondary | ICD-10-CM | POA: Diagnosis not present

## 2022-09-28 DIAGNOSIS — R053 Chronic cough: Secondary | ICD-10-CM | POA: Diagnosis not present

## 2022-09-28 DIAGNOSIS — Z6827 Body mass index (BMI) 27.0-27.9, adult: Secondary | ICD-10-CM | POA: Diagnosis not present

## 2022-09-28 DIAGNOSIS — M069 Rheumatoid arthritis, unspecified: Secondary | ICD-10-CM | POA: Diagnosis not present

## 2022-10-05 DIAGNOSIS — R053 Chronic cough: Secondary | ICD-10-CM | POA: Diagnosis not present

## 2022-10-06 DIAGNOSIS — R053 Chronic cough: Secondary | ICD-10-CM | POA: Diagnosis not present

## 2022-10-06 DIAGNOSIS — R059 Cough, unspecified: Secondary | ICD-10-CM | POA: Diagnosis not present

## 2022-10-06 DIAGNOSIS — K449 Diaphragmatic hernia without obstruction or gangrene: Secondary | ICD-10-CM | POA: Diagnosis not present

## 2022-10-06 NOTE — Telephone Encounter (Signed)
Spoke with patient rep regarding ENBREL PAP renewal application. Patient rep gave an estimated determination of 24-48 hours.  Maryan Puls, PharmD PGY-1 [facility] Resident

## 2022-10-08 NOTE — Telephone Encounter (Signed)
Received a fax from  [place] regarding an approval for ENBREL patient assistance from 10/07/2022 to 08/23/2023. Approval letter sent to scan center.  Phone number: [phone]  Knox Saliva, PharmD, MPH, BCPS, CPP Clinical Pharmacist (Rheumatology and Pulmonology)

## 2022-10-22 DIAGNOSIS — E785 Hyperlipidemia, unspecified: Secondary | ICD-10-CM | POA: Diagnosis not present

## 2022-10-22 DIAGNOSIS — I129 Hypertensive chronic kidney disease with stage 1 through stage 4 chronic kidney disease, or unspecified chronic kidney disease: Secondary | ICD-10-CM | POA: Diagnosis not present

## 2022-10-26 DIAGNOSIS — J449 Chronic obstructive pulmonary disease, unspecified: Secondary | ICD-10-CM | POA: Diagnosis not present

## 2022-10-26 DIAGNOSIS — K449 Diaphragmatic hernia without obstruction or gangrene: Secondary | ICD-10-CM | POA: Diagnosis not present

## 2022-10-26 DIAGNOSIS — Z6827 Body mass index (BMI) 27.0-27.9, adult: Secondary | ICD-10-CM | POA: Diagnosis not present

## 2022-11-04 ENCOUNTER — Other Ambulatory Visit (HOSPITAL_COMMUNITY): Payer: Self-pay

## 2022-11-04 ENCOUNTER — Telehealth: Payer: Self-pay | Admitting: Pharmacist

## 2022-11-04 DIAGNOSIS — M810 Age-related osteoporosis without current pathological fracture: Secondary | ICD-10-CM

## 2022-11-04 DIAGNOSIS — Z79899 Other long term (current) drug therapy: Secondary | ICD-10-CM | POA: Diagnosis not present

## 2022-11-04 NOTE — Telephone Encounter (Signed)
Patient due for Prolia on 11/10/2022.  Pharmacy benefits: Per test claim, copay for Prolia is $95  Medical benefits: Patient has coverage under traditional Medicare, which covers 80% of services received under Part B benefits.  Patient also has active coverage through [place] follows Medicare guidelines. This plan picks up remaining 20% not covered by Part B, and also provides reimbursement of up to 100% of any deductible. Per benefits summary, no pre-certification is required by [place] with patient. She will get labwork completed today or tomorrow at [facility] in [place]. CBC and CMP released today.  Knox Saliva, PharmD, MPH, BCPS, CPP Clinical Pharmacist (Rheumatology and Pulmonology)

## 2022-11-05 ENCOUNTER — Other Ambulatory Visit: Payer: Self-pay | Admitting: Pharmacist

## 2022-11-05 DIAGNOSIS — M810 Age-related osteoporosis without current pathological fracture: Secondary | ICD-10-CM

## 2022-11-05 LAB — CBC WITH DIFFERENTIAL/PLATELET
Basophils Absolute: 0 10*3/uL (ref 0.0–0.2)
Basos: 1 %
EOS (ABSOLUTE): 0.5 10*3/uL — ABNORMAL HIGH (ref 0.0–0.4)
Eos: 12 %
Hematocrit: 37.3 % (ref 34.0–46.6)
Hemoglobin: 12.5 g/dL (ref 11.1–15.9)
Immature Grans (Abs): 0 10*3/uL (ref 0.0–0.1)
Immature Granulocytes: 0 %
Lymphocytes Absolute: 1.6 10*3/uL (ref 0.7–3.1)
Lymphs: 38 %
MCH: 31.6 pg (ref 26.6–33.0)
MCHC: 33.5 g/dL (ref 31.5–35.7)
MCV: 94 fL (ref 79–97)
Monocytes Absolute: 0.4 10*3/uL (ref 0.1–0.9)
Monocytes: 10 %
Neutrophils Absolute: 1.6 10*3/uL (ref 1.4–7.0)
Neutrophils: 39 %
Platelets: 189 10*3/uL (ref 150–450)
RBC: 3.96 x10E6/uL (ref 3.77–5.28)
RDW: 12.1 % (ref 11.7–15.4)
WBC: 4.1 10*3/uL (ref 3.4–10.8)

## 2022-11-05 LAB — COMPREHENSIVE METABOLIC PANEL
ALT: 10 IU/L (ref 0–32)
AST: 20 IU/L (ref 0–40)
Albumin/Globulin Ratio: 1 — ABNORMAL LOW (ref 1.2–2.2)
Albumin: 3.9 g/dL (ref 3.7–4.7)
Alkaline Phosphatase: 57 IU/L (ref 44–121)
BUN/Creatinine Ratio: 19 (ref 12–28)
BUN: 19 mg/dL (ref 8–27)
Bilirubin Total: 0.4 mg/dL (ref 0.0–1.2)
CO2: 25 mmol/L (ref 20–29)
Calcium: 9.9 mg/dL (ref 8.7–10.3)
Chloride: 102 mmol/L (ref 96–106)
Creatinine, Ser: 1 mg/dL (ref 0.57–1.00)
Globulin, Total: 3.9 g/dL (ref 1.5–4.5)
Glucose: 74 mg/dL (ref 70–99)
Potassium: 4 mmol/L (ref 3.5–5.2)
Sodium: 140 mmol/L (ref 134–144)
Total Protein: 7.8 g/dL (ref 6.0–8.5)
eGFR: 56 mL/min/{1.73_m2} — ABNORMAL LOW (ref >59)

## 2022-11-05 NOTE — Progress Notes (Signed)
Next Prolia SQ due on 11/10/22. Diagnosis: age-related osteoporosis  Dose: 60 mg SQ every 6 months  Last Clinic Visit: 09/09/22 Next Clinic Visit: 02/08/23  Last Prolia dose: 05/14/22  Labs: CBC and CMST on 11/04/22 - stable Last DEXA: 05/05/21 Next DEXA due: 04/2023  Orders placed for Prolia x 1 dose. No premedicatons required.   Will cll patient and provide with phone number for: [facility] [phone]) Lady Gary  Will follow-up to ensured scheduled and completed   Knox Saliva, PharmD, MPH, BCPS, CPP Clinical Pharmacist (Rheumatology and Pulmonology)

## 2022-11-05 NOTE — Progress Notes (Signed)
CBC and CMP are stable.

## 2022-11-09 NOTE — Progress Notes (Signed)
Called patient and provided her with Medical Day phone number to schedule Prolia injection. Wil f/u to ensure scheduled and completed  Knox Saliva, PharmD, MPH, BCPS, CPP Clinical Pharmacist (Rheumatology and Pulmonology)

## 2022-11-10 DIAGNOSIS — E785 Hyperlipidemia, unspecified: Secondary | ICD-10-CM | POA: Diagnosis not present

## 2022-11-10 DIAGNOSIS — I129 Hypertensive chronic kidney disease with stage 1 through stage 4 chronic kidney disease, or unspecified chronic kidney disease: Secondary | ICD-10-CM | POA: Diagnosis not present

## 2022-11-10 DIAGNOSIS — R7303 Prediabetes: Secondary | ICD-10-CM | POA: Diagnosis not present

## 2022-11-15 DIAGNOSIS — N183 Chronic kidney disease, stage 3 unspecified: Secondary | ICD-10-CM | POA: Diagnosis not present

## 2022-11-15 DIAGNOSIS — Z6827 Body mass index (BMI) 27.0-27.9, adult: Secondary | ICD-10-CM | POA: Diagnosis not present

## 2022-11-15 DIAGNOSIS — E785 Hyperlipidemia, unspecified: Secondary | ICD-10-CM | POA: Diagnosis not present

## 2022-11-15 DIAGNOSIS — R7303 Prediabetes: Secondary | ICD-10-CM | POA: Diagnosis not present

## 2022-11-15 DIAGNOSIS — I129 Hypertensive chronic kidney disease with stage 1 through stage 4 chronic kidney disease, or unspecified chronic kidney disease: Secondary | ICD-10-CM | POA: Diagnosis not present

## 2022-11-16 NOTE — Progress Notes (Signed)
Prolia scheduled for 11/23/2022. Will f/u to ensure completed  Knox Saliva, PharmD, MPH, BCPS, CPP Clinical Pharmacist (Rheumatology and Pulmonology)

## 2022-11-16 NOTE — Progress Notes (Signed)
Conference called Medical Day w pt. She is scheduled for 11/23/22  Knox Saliva, PharmD, MPH, BCPS, CPP Clinical Pharmacist (Rheumatology and Pulmonology)

## 2022-11-22 ENCOUNTER — Ambulatory Visit (HOSPITAL_COMMUNITY)
Admission: RE | Admit: 2022-11-22 | Discharge: 2022-11-22 | Disposition: A | Discharge status: Home / Self Care | Payer: Medicare HMO | Source: Ambulatory Visit | Attending: Rheumatology | Admitting: Rheumatology

## 2022-11-22 DIAGNOSIS — J449 Chronic obstructive pulmonary disease, unspecified: Secondary | ICD-10-CM | POA: Diagnosis not present

## 2022-11-22 DIAGNOSIS — M810 Age-related osteoporosis without current pathological fracture: Secondary | ICD-10-CM | POA: Diagnosis not present

## 2022-11-22 DIAGNOSIS — I129 Hypertensive chronic kidney disease with stage 1 through stage 4 chronic kidney disease, or unspecified chronic kidney disease: Secondary | ICD-10-CM | POA: Diagnosis not present

## 2022-11-22 MED ORDER — DENOSUMAB 60 MG/ML ~~LOC~~ SOSY
PREFILLED_SYRINGE | SUBCUTANEOUS | Status: AC
  Filled 2022-11-22: qty 1

## 2022-11-22 MED ORDER — DENOSUMAB 60 MG/ML ~~LOC~~ SOSY
60.0000 mg | PREFILLED_SYRINGE | Freq: Once | SUBCUTANEOUS | Status: AC
  Administered 2022-11-22: 60 mg via SUBCUTANEOUS

## 2022-11-23 ENCOUNTER — Encounter (HOSPITAL_COMMUNITY): Payer: Medicare HMO

## 2022-11-24 DIAGNOSIS — J449 Chronic obstructive pulmonary disease, unspecified: Secondary | ICD-10-CM | POA: Diagnosis not present

## 2022-11-30 DIAGNOSIS — N183 Chronic kidney disease, stage 3 unspecified: Secondary | ICD-10-CM | POA: Diagnosis not present

## 2022-11-30 DIAGNOSIS — J449 Chronic obstructive pulmonary disease, unspecified: Secondary | ICD-10-CM | POA: Diagnosis not present

## 2022-11-30 DIAGNOSIS — Z6827 Body mass index (BMI) 27.0-27.9, adult: Secondary | ICD-10-CM | POA: Diagnosis not present

## 2022-11-30 DIAGNOSIS — I129 Hypertensive chronic kidney disease with stage 1 through stage 4 chronic kidney disease, or unspecified chronic kidney disease: Secondary | ICD-10-CM | POA: Diagnosis not present

## 2022-12-22 DIAGNOSIS — I129 Hypertensive chronic kidney disease with stage 1 through stage 4 chronic kidney disease, or unspecified chronic kidney disease: Secondary | ICD-10-CM | POA: Diagnosis not present

## 2022-12-22 DIAGNOSIS — J449 Chronic obstructive pulmonary disease, unspecified: Secondary | ICD-10-CM | POA: Diagnosis not present

## 2023-01-22 DIAGNOSIS — I129 Hypertensive chronic kidney disease with stage 1 through stage 4 chronic kidney disease, or unspecified chronic kidney disease: Secondary | ICD-10-CM | POA: Diagnosis not present

## 2023-01-22 DIAGNOSIS — J449 Chronic obstructive pulmonary disease, unspecified: Secondary | ICD-10-CM | POA: Diagnosis not present

## 2023-01-25 NOTE — Progress Notes (Unsigned)
Office Visit Note  Patient: Andrea [name]             Date of Birth: 1941-07-22           MRN: [id number]             PCP: Abner Greenspan, MD Referring: Abner Greenspan, MD Visit Date: 02/08/2023 Occupation: @GUAROCC @  Subjective:  Pain in both feet   History of Present Illness: Andrea [name] is a 82 y.o. female with history of seropositive rheumatoid arthritis and osteoporosis.  Patient remains on Enbrel 50 mg sq injections once weekly.  She is tolerating Enbrel without any side effects or injection site reactions.  She has not missed any doses recently.  She denies any recent or recurrent infections.  Patient states that she has intermittent discomfort in both feet but denies any joint swelling.  She has some stiffness in her knee replacements but no active inflammation.  Overall her symptoms remain stable taking Enbrel as prescribed.  Patient states that she has been diagnosed with COPD and has been started on Trelegy.  She denies any other new medical conditions.  She remains on Prolia 60 mg sq injections every 6 months.  Her last Prolia injection was administered on 11/05/2022.  She has been taking a vitamin D supplement daily.   Activities of Daily Living:  Patient reports morning stiffness for a few minutes.   Patient Reports nocturnal pain.  Difficulty dressing/grooming: Denies Difficulty climbing stairs: Reports Difficulty getting out of chair: Reports Difficulty using hands for taps, buttons, cutlery, and/or writing: Reports  Review of Systems  Constitutional:  Positive for fatigue.  HENT:  Positive for mouth dryness. Negative for mouth sores.   Eyes:  Negative for dryness.  Respiratory:  Negative for shortness of breath.   Cardiovascular:  Positive for chest pain. Negative for palpitations.  Gastrointestinal:  Positive for constipation. Negative for abdominal pain, blood in stool and diarrhea.  Endocrine: Negative for increased urination.  Genitourinary:  Negative for  involuntary urination.  Musculoskeletal:  Positive for joint pain, gait problem, joint pain, joint swelling, myalgias, muscle weakness, morning stiffness, muscle tenderness and myalgias.  Skin:  Positive for hair loss. Negative for color change, rash and sensitivity to sunlight.  Allergic/Immunologic: Negative for susceptible to infections.  Neurological:  Negative for dizziness and headaches.  Hematological:  Positive for swollen glands.  Psychiatric/Behavioral:  Positive for sleep disturbance. Negative for depressed mood. The patient is not nervous/anxious.     PMFS History:  Patient Active Problem List   Diagnosis Date Noted   History of hypertension 02/08/2017   History of gastroesophageal reflux (GERD) 02/08/2017   High risk medication use 12/27/2016   Vitamin D deficiency 12/27/2016   History of total knee replacement, bilateral 08/27/2016   Age-related osteoporosis without current pathological fracture 08/27/2016   Seropositive rheumatoid arthritis of multiple sites (HCC) 06/21/2016    Past Medical History:  Diagnosis Date   Arthritis    Cataract    GERD (gastroesophageal reflux disease)    History of colon polyps    Hypercholesterolemia    Hypertension    Osteoporosis     Family History  Problem Relation Age of Onset   Kidney disease Mother    Cancer Father    Bladder Cancer Daughter    Rheum arthritis Daughter    Rheum arthritis Daughter    Stroke Brother    Lung disease Brother    Colon cancer Neg Hx    Esophageal cancer Neg Hx  Rectal cancer Neg Hx    Stomach cancer Neg Hx    Breast cancer Neg Hx    Past Surgical History:  Procedure Laterality Date   CARPAL TUNNEL RELEASE     COLONOSCOPY  12/23/2010   Mild pancolonic diverticulosis. Small internal hemorroids   COLONOSCOPY  08/2019   ESOPHAGOGASTRODUODENOSCOPY  05/09/2013   Hiatal Hernia. Presbyesophagus. Minimal antral gastritis.   GUM SURGERY     JOINT REPLACEMENT     BIL knee    KNEE ARTHROPLASTY      Right knee 2012, Left knee total 2009   TUBAL LIGATION Bilateral    UPPER GI ENDOSCOPY  08/2019   Social History   Social History Narrative   Not on file   Immunization History  Administered Date(s) Administered   Moderna Sars-Covid-2 Vaccination 09/14/2019, 10/12/2019, 07/11/2020     Objective: Vital Signs: BP 125/68 (BP Location: Left Arm, Patient Position: Sitting, Cuff Size: Normal)   Pulse 61   Resp 15   Ht 5\' 2"  (1.575 m)   Wt 157 lb 9.6 oz (71.5 kg)   BMI 28.83 kg/m    Physical Exam Vitals and nursing note reviewed.  Constitutional:      Appearance: She is well-developed.  HENT:     Head: Normocephalic and atraumatic.  Eyes:     Conjunctiva/sclera: Conjunctivae normal.  Cardiovascular:     Rate and Rhythm: Normal rate and regular rhythm.     Heart sounds: Normal heart sounds.  Pulmonary:     Effort: Pulmonary effort is normal.     Breath sounds: Normal breath sounds.  Abdominal:     General: Bowel sounds are normal.     Palpations: Abdomen is soft.  Musculoskeletal:     Cervical back: Normal range of motion.     Right lower leg: Edema (Pitting edema bilateral LE) present.     Left lower leg: Edema present.  Lymphadenopathy:     Cervical: No cervical adenopathy.  Skin:    General: Skin is warm and dry.     Capillary Refill: Capillary refill takes less than 2 seconds.  Neurological:     Mental Status: She is alert and oriented to person, place, and time.  Psychiatric:        Behavior: Behavior normal.      Musculoskeletal Exam: C-spine has good range of motion.  Some midline spinal tenderness in the cervical spine.  Thoracic kyphosis noted.  Some midline spinal tenderness in the lumbar region.  No SI joint tenderness.  Painful range of motion of the right shoulder.  Full range of motion of both shoulders and both elbows.  Wrist joints have good range of motion.  Ulnar deviation synovial thickening of all MCP joints noted.  No synovitis noted.  Hip  joints have good range of motion with no groin pain.  Both knee replacements have good range of motion.  No warmth or effusion noted.  Ankle joints have good range of motion with no tenderness or joint swelling.  Pitting edema noted in bilateral lower extremities.  No tenderness or synovitis over MTP joints.  PIP and DIP thickening consistent with osteoarthritis of both feet.  No evidence of Achilles tendinitis or plantar fasciitis.  Some loss of the fat pad under MTP joints noted.  CDAI Exam: CDAI Score: 7  Patient Global: 30 / 100; Provider Global: 30 / 100 Swollen: 0 ; Tender: 5  Joint Exam 02/08/2023      Right  Left  Glenohumeral   Tender  Cervical Spine   Tender     Lumbar Spine   Tender     PIP 1 (toe)   Tender     DIP 2 (toe)   Tender        Investigation: No additional findings.  Imaging: No results found.  Recent Labs: Lab Results  Component Value Date   WBC 4.1 11/04/2022   HGB 12.5 11/04/2022   PLT 189 11/04/2022   NA 140 11/04/2022   K 4.0 11/04/2022   CL 102 11/04/2022   CO2 25 11/04/2022   GLUCOSE 74 11/04/2022   BUN 19 11/04/2022   CREATININE 1.00 11/04/2022   BILITOT 0.4 11/04/2022   ALKPHOS 57 11/04/2022   AST 20 11/04/2022   ALT 10 11/04/2022   PROT 7.8 11/04/2022   ALBUMIN 3.9 11/04/2022   CALCIUM 9.9 11/04/2022   GFRAA 37 (L) 09/17/2020   QFTBGOLDPLUS Negative 05/06/2022    Speciality Comments: TB Gold: 03/25/17 Neg Forteo 07/18-10/20 Reclast November 28, 2012, June 06, 2015, July 06, 2016, June 29, 2019, August 05, 2020  Procedures:  No procedures performed Allergies: Patient has no known allergies.   Assessment / Plan:     Visit Diagnoses: Seropositive rheumatoid arthritis of multiple sites (HCC) - +RF,+anti CCP: She has no synovitis on examination today.  Her rheumatoid arthritis remains well-controlled on Enbrel 50 mg subcutaneous injections once weekly.  She continues to tolerate Enbrel without any side effects or injection  site reactions.  She has not had any recent or recurrent infections.  She experiences intermittent discomfort in both feet due to underlying osteoarthritis.  Thickening of bilateral first MTP joints along with PIP and DIP thickening noted on examination today.  No synovitis was noted on exam.  Discussed the importance of wearing proper fitting shoes.  She plans on continuing to use topical agents as needed for pain relief.   X-rays of both hands and feet were obtained on 04/20/2021 which were consistent with severe rheumatoid arthritis and osteoarthritis overlap.  No radiographic progression was noted compared to 2019.  Discussed that we can update x-rays at her follow-up visit to assess for any interval change. She will remain on Enbrel 50 mg subcutaneous injections once weekly.  She was advised to notify us if she develops signs or symptoms of a flare.  She will follow-up in the office in 5 months or sooner if needed.  High risk medication use - Enbrel Sureclick 50 mg sq every 7 days. CBC and CMP updated on 11/04/22.  Orders for CBC and CMP were released today.  Her next lab work will be due in September and every 3 months to monitor for drug toxicity.  Standing orders for CBC and CMP were placed today for [facility]. TB gold negative on 05/06/22.  Future order for TB gold was placed for [facility]. No recent or recurrent infections.  Discussed the importance of holding enbrel if she develops signs or symptoms of an infection and to resume once the infection has completely cleared.   Discussed the importance of yearly skin examinations while on Enbrel due to the increased risk for skin cancer.  Patient was encouraged to schedule follow-up visit with her dermatologist. - Plan: COMPLETE METABOLIC PANEL WITH GFR, CBC with Differential/Platelet, QuantiFERON-TB Gold Plus, CBC with Differential/Platelet, CMP14+EGFR  Screening for tuberculosis -Future order for TB gold placed today.  Plan: QuantiFERON-TB Gold  Plus  Age-related osteoporosis without current pathological fracture - July 05 2021 DEXA scan showed T score -3.1, BMD 0.50 7,1/3 left  left distal radius.  3% improvement from 2020. She remains on Prolia 60 mg subcutaneous injections every 6 months.  Her last Prolia injection was administered on 11/05/2022.  She continues to take a daily vitamin D supplement.  Future order for vitamin D was placed today to be drawn with her next lab work along with CBC and CMP in September prior to her next prolia injection.  Due to update DEXA in November 2024.   Vitamin D deficiency -Future order for vitamin D placed today.  Plan: VITAMIN D 25 Hydroxy (Vit-D Deficiency, Fractures)  Subdeltoid bursitis of right shoulder joint: Patient has painful range of motion of the right shoulder.  She experiences some discomfort when laying on her right side at night or with overhead activity.  Trochanteric bursitis, right hip: Not currently symptomatic.  History of total knee replacement, bilateral: Doing well.  Good range of motion with some stiffness bilaterally.  No warmth or effusion noted.  Other medical conditions are listed as follows:  Chronic midline low back pain without sciatica: Not currently symptomatic.  History of hypertension - BP 125/68 today in the office.  History of gastroesophageal reflux (GERD)  History of COPD: Patient reports being diagnosed with COPD.  She has been started on Trelegy.   Orders: Orders Placed This Encounter  Procedures   COMPLETE METABOLIC PANEL WITH GFR   CBC with Differential/Platelet   QuantiFERON-TB Gold Plus   CBC with Differential/Platelet   CMP14+EGFR   VITAMIN D 25 Hydroxy (Vit-D Deficiency, Fractures)   No orders of the defined types were placed in this encounter.  Follow-Up Instructions: Return in about 5 months (around 07/11/2023) for Rheumatoid arthritis.   Gearldine Bienenstock, PA-C  Note - This record has been created using Dragon software.  Chart  creation errors have been sought, but may not always  have been located. Such creation errors do not reflect on  the standard of medical care.

## 2023-02-08 ENCOUNTER — Encounter: Payer: Self-pay | Admitting: Physician Assistant

## 2023-02-08 ENCOUNTER — Ambulatory Visit: Payer: Medicare PPO | Attending: Physician Assistant | Admitting: Physician Assistant

## 2023-02-08 VITALS — BP 125/68 | HR 61 | Resp 15 | Ht 62.0 in | Wt 157.6 lb

## 2023-02-08 DIAGNOSIS — G8929 Other chronic pain: Secondary | ICD-10-CM

## 2023-02-08 DIAGNOSIS — Z79899 Other long term (current) drug therapy: Secondary | ICD-10-CM | POA: Diagnosis not present

## 2023-02-08 DIAGNOSIS — M7061 Trochanteric bursitis, right hip: Secondary | ICD-10-CM | POA: Diagnosis not present

## 2023-02-08 DIAGNOSIS — Z111 Encounter for screening for respiratory tuberculosis: Secondary | ICD-10-CM

## 2023-02-08 DIAGNOSIS — Z8709 Personal history of other diseases of the respiratory system: Secondary | ICD-10-CM

## 2023-02-08 DIAGNOSIS — Z8679 Personal history of other diseases of the circulatory system: Secondary | ICD-10-CM

## 2023-02-08 DIAGNOSIS — E559 Vitamin D deficiency, unspecified: Secondary | ICD-10-CM

## 2023-02-08 DIAGNOSIS — M0579 Rheumatoid arthritis with rheumatoid factor of multiple sites without organ or systems involvement: Secondary | ICD-10-CM

## 2023-02-08 DIAGNOSIS — M810 Age-related osteoporosis without current pathological fracture: Secondary | ICD-10-CM

## 2023-02-08 DIAGNOSIS — Z96653 Presence of artificial knee joint, bilateral: Secondary | ICD-10-CM | POA: Diagnosis not present

## 2023-02-08 DIAGNOSIS — M7551 Bursitis of right shoulder: Secondary | ICD-10-CM

## 2023-02-08 DIAGNOSIS — Z8719 Personal history of other diseases of the digestive system: Secondary | ICD-10-CM

## 2023-02-08 DIAGNOSIS — M545 Low back pain, unspecified: Secondary | ICD-10-CM | POA: Diagnosis not present

## 2023-02-08 DIAGNOSIS — Z8639 Personal history of other endocrine, nutritional and metabolic disease: Secondary | ICD-10-CM

## 2023-02-08 LAB — CBC WITH DIFFERENTIAL/PLATELET
Absolute Monocytes: 413 cells/uL (ref 200–950)
Basophils Absolute: 29 cells/uL (ref 0–200)
Basophils Relative: 0.6 %
Eosinophils Absolute: 504 cells/uL — ABNORMAL HIGH (ref 15–500)
Eosinophils Relative: 10.5 %
HCT: 34.9 % — ABNORMAL LOW (ref 35.0–45.0)
Hemoglobin: 12 g/dL (ref 11.7–15.5)
Lymphs Abs: 1718 cells/uL (ref 850–3900)
MCH: 31.8 pg (ref 27.0–33.0)
MCHC: 34.4 g/dL (ref 32.0–36.0)
MCV: 92.6 fL (ref 80.0–100.0)
MPV: 9.9 fL (ref 7.5–12.5)
Monocytes Relative: 8.6 %
Neutro Abs: 2136 cells/uL (ref 1500–7800)
Neutrophils Relative %: 44.5 %
Platelets: 177 10*3/uL (ref 140–400)
RBC: 3.77 10*6/uL — ABNORMAL LOW (ref 3.80–5.10)
RDW: 12.3 % (ref 11.0–15.0)
Total Lymphocyte: 35.8 %
WBC: 4.8 10*3/uL (ref 3.8–10.8)

## 2023-02-08 LAB — COMPLETE METABOLIC PANEL WITH GFR
AG Ratio: 1 (calc) (ref 1.0–2.5)
ALT: 10 U/L (ref 6–29)
AST: 17 U/L (ref 10–35)
Albumin: 3.8 g/dL (ref 3.6–5.1)
Alkaline phosphatase (APISO): 48 U/L (ref 37–153)
BUN/Creatinine Ratio: 21 (calc) (ref 6–22)
BUN: 23 mg/dL (ref 7–25)
CO2: 32 mmol/L (ref 20–32)
Calcium: 9.7 mg/dL (ref 8.6–10.4)
Chloride: 103 mmol/L (ref 98–110)
Creat: 1.09 mg/dL — ABNORMAL HIGH (ref 0.60–0.95)
Globulin: 3.7 g/dL (calc) (ref 1.9–3.7)
Glucose, Bld: 76 mg/dL (ref 65–99)
Potassium: 3.7 mmol/L (ref 3.5–5.3)
Sodium: 140 mmol/L (ref 135–146)
Total Bilirubin: 0.4 mg/dL (ref 0.2–1.2)
Total Protein: 7.5 g/dL (ref 6.1–8.1)
eGFR: 50 mL/min/{1.73_m2} — ABNORMAL LOW (ref >=60)

## 2023-02-08 NOTE — Patient Instructions (Signed)
Standing Labs We placed an order today for your standing lab work.   Please have your standing labs drawn in September and every 3 months   Please have your labs drawn 2 weeks prior to your appointment so that the provider can discuss your lab results at your appointment, if possible.  Please note that you may see your imaging and lab results in MyChart before we have reviewed them. We will contact you once all results are reviewed. Please allow our office up to 72 hours to thoroughly review all of the results before contacting the office for clarification of your results.  WALK-IN LAB HOURS  Monday through Thursday from 8:00 am -12:30 pm and 1:00 pm-5:00 pm and Friday from 8:00 am-12:00 pm.  Patients with office visits requiring labs will be seen before walk-in labs.  You may encounter longer than normal wait times. Please allow additional time. Wait times may be shorter on  Monday and Thursday afternoons.  We do not book appointments for walk-in labs. We appreciate your patience and understanding with our staff.   Labs are drawn by [facility]. Please bring your co-pay at the time of your lab draw.  You may receive a bill from [facility] for your lab work.  Please note if you are on Hydroxychloroquine and and an order has been placed for a Hydroxychloroquine level,  you will need to have it drawn 4 hours or more after your last dose.  If you wish to have your labs drawn at another location, please call the office 24 hours in advance so we can fax the orders.  The office is located at [address], [address], [place], [place] [address]   If you have any questions regarding directions or hours of operation,  please call [phone].   As a reminder, please drink plenty of water prior to coming for your lab work. Thanks!  

## 2023-02-08 NOTE — Progress Notes (Signed)
CBC and CMP stable.  Creatinine was elevated at 1.09 and GFR was 50.  Advised the patient to avoid NSAID use and increase water intake.

## 2023-02-21 DIAGNOSIS — J449 Chronic obstructive pulmonary disease, unspecified: Secondary | ICD-10-CM | POA: Diagnosis not present

## 2023-02-21 DIAGNOSIS — I129 Hypertensive chronic kidney disease with stage 1 through stage 4 chronic kidney disease, or unspecified chronic kidney disease: Secondary | ICD-10-CM | POA: Diagnosis not present

## 2023-03-07 ENCOUNTER — Other Ambulatory Visit: Payer: Self-pay | Admitting: *Deleted

## 2023-03-07 DIAGNOSIS — M0579 Rheumatoid arthritis with rheumatoid factor of multiple sites without organ or systems involvement: Secondary | ICD-10-CM

## 2023-03-07 MED ORDER — ENBREL SURECLICK 50 MG/ML ~~LOC~~ SOAJ: 50.0000 mg | SUBCUTANEOUS | 0 refills | Status: DC

## 2023-03-07 NOTE — Telephone Encounter (Signed)
Refill request received via fax from [employer] for Enbrel  Last Fill: 06/08/2022  Labs: 02/08/2023 CBC and CMP stable.  Creatinine was elevated at 1.09 and GFR was 50.     TB Gold: 05/06/2022 Neg    Next Visit: 07/11/2023  Last Visit: 02/08/2023  DX: Seropositive rheumatoid arthritis of multiple sites   Current Dose per office note 02/08/2023: Enbrel Sureclick 50 mg sq every 7 days.   Okay to refill Enbrel?

## 2023-04-11 DIAGNOSIS — E785 Hyperlipidemia, unspecified: Secondary | ICD-10-CM | POA: Diagnosis not present

## 2023-04-11 DIAGNOSIS — I129 Hypertensive chronic kidney disease with stage 1 through stage 4 chronic kidney disease, or unspecified chronic kidney disease: Secondary | ICD-10-CM | POA: Diagnosis not present

## 2023-04-11 DIAGNOSIS — R7303 Prediabetes: Secondary | ICD-10-CM | POA: Diagnosis not present

## 2023-04-18 DIAGNOSIS — Z1331 Encounter for screening for depression: Secondary | ICD-10-CM | POA: Diagnosis not present

## 2023-04-18 DIAGNOSIS — N183 Chronic kidney disease, stage 3 unspecified: Secondary | ICD-10-CM | POA: Diagnosis not present

## 2023-04-18 DIAGNOSIS — R7303 Prediabetes: Secondary | ICD-10-CM | POA: Diagnosis not present

## 2023-04-18 DIAGNOSIS — E785 Hyperlipidemia, unspecified: Secondary | ICD-10-CM | POA: Diagnosis not present

## 2023-04-18 DIAGNOSIS — Z0000 Encounter for general adult medical examination without abnormal findings: Secondary | ICD-10-CM | POA: Diagnosis not present

## 2023-04-18 DIAGNOSIS — Z23 Encounter for immunization: Secondary | ICD-10-CM | POA: Diagnosis not present

## 2023-04-18 DIAGNOSIS — I129 Hypertensive chronic kidney disease with stage 1 through stage 4 chronic kidney disease, or unspecified chronic kidney disease: Secondary | ICD-10-CM | POA: Diagnosis not present

## 2023-04-18 DIAGNOSIS — Z139 Encounter for screening, unspecified: Secondary | ICD-10-CM | POA: Diagnosis not present

## 2023-04-18 DIAGNOSIS — Z1389 Encounter for screening for other disorder: Secondary | ICD-10-CM | POA: Diagnosis not present

## 2023-05-06 DIAGNOSIS — Z23 Encounter for immunization: Secondary | ICD-10-CM | POA: Diagnosis not present

## 2023-05-11 ENCOUNTER — Other Ambulatory Visit (HOSPITAL_COMMUNITY): Payer: Self-pay

## 2023-05-11 ENCOUNTER — Telehealth: Payer: Self-pay | Admitting: Pharmacist

## 2023-05-11 DIAGNOSIS — E559 Vitamin D deficiency, unspecified: Secondary | ICD-10-CM

## 2023-05-11 DIAGNOSIS — Z111 Encounter for screening for respiratory tuberculosis: Secondary | ICD-10-CM

## 2023-05-11 DIAGNOSIS — Z79899 Other long term (current) drug therapy: Secondary | ICD-10-CM

## 2023-05-11 NOTE — Telephone Encounter (Signed)
Patient due for Prolia on 05/21/23  Copay through pharmacy benefit is $419.73.  Submitted a Prior Authorization request to [facility] for PROLIA via CoverMyMeds for medical benefit buy-and-bill. Will update once we receive a response.  Key: [id number]  Chesley Mires, PharmD, MPH, BCPS, CPP Clinical Pharmacist (Rheumatology and Pulmonology)

## 2023-05-19 NOTE — Addendum Note (Signed)
Addended by: Murrell Redden on: 05/19/2023 11:39 AM   Modules accepted: Orders

## 2023-05-19 NOTE — Telephone Encounter (Signed)
Received notification from [facility] regarding a prior authorization for  PROLIA [phone]) . Authorization has been APPROVED from 08/23/22 to 08/23/23. Approval letter sent to scan center.  Authorization # [id number] Phone # [phone]  ATC patient - she will need updated CBC, CMP, Vitamin D, and TB gold. Left VM for pt. Appears she has labs completed at [facility] requested call back for blood work.  Order for Medical Day will be placed after labs have resulted  Chesley Mires, PharmD, MPH, BCPS, CPP Clinical Pharmacist (Rheumatology and Pulmonology)

## 2023-05-24 NOTE — Telephone Encounter (Signed)
Orders for CBC, CMP, Vitamin D, and TB gold release today for [facility] in [place]  Chesley Mires, PharmD, MPH, BCPS, CPP Clinical Pharmacist (Rheumatology and Pulmonology)

## 2023-05-24 NOTE — Telephone Encounter (Signed)
Called patient to ask her if she had been able to complete lab work following last [employer]. She stated she had completed labs two weeks ago. I informed her that we needed additional labs in order to schedule her for her next Prolia injection. She agreed to complete the additional lab work within the next few days. Orders were placed to [facility] in [place].   Sofie Rower, PharmD [employer] PGY1

## 2023-05-25 DIAGNOSIS — Z111 Encounter for screening for respiratory tuberculosis: Secondary | ICD-10-CM | POA: Diagnosis not present

## 2023-05-25 DIAGNOSIS — Z79899 Other long term (current) drug therapy: Secondary | ICD-10-CM | POA: Diagnosis not present

## 2023-05-25 DIAGNOSIS — E559 Vitamin D deficiency, unspecified: Secondary | ICD-10-CM | POA: Diagnosis not present

## 2023-05-26 ENCOUNTER — Other Ambulatory Visit: Payer: Self-pay | Admitting: Pharmacist

## 2023-05-26 DIAGNOSIS — M810 Age-related osteoporosis without current pathological fracture: Secondary | ICD-10-CM

## 2023-05-26 DIAGNOSIS — Z79899 Other long term (current) drug therapy: Secondary | ICD-10-CM

## 2023-05-26 NOTE — Progress Notes (Signed)
Next Prolia SQ due on 05/21/23. Diagnosis: age-related osteoporosis  Dose: 60 mg SQ every 6 months  Last Clinic Visit: 02/08/23 Next Clinic Visit: 07/11/23  Last Prolia dose: 11/22/2022  Labs: CBC, CMP, Vitamin D on 05/25/2023 Last DEXA: 05/05/2021 Next DEXA due: September 2024  Orders placed for Prolia x 1 dose. No premedicatons required.   Called patient and provided with phone number for: [facility] [phone]) Ginette Otto  Will follow-up to ensured scheduled and completed   Chesley Mires, PharmD, MPH, BCPS, CPP Clinical Pharmacist (Rheumatology and Pulmonology)

## 2023-05-26 NOTE — Progress Notes (Signed)
Absolute eosinophils are elevated. Rest of CBC WNL. We will continue to monitor.   Creatinine remains elevated-1.12 and GFR is low-49. Avoid the use of NSAIDs. Rest of CMP WNL TB gold pending Vitamin D WNL

## 2023-05-27 NOTE — Telephone Encounter (Signed)
Prolia order placed for Medical Day on 05/26/2023. Patient will need to call to schedule

## 2023-05-29 LAB — CBC WITH DIFFERENTIAL/PLATELET
Basophils Absolute: 0 10*3/uL (ref 0.0–0.2)
Basos: 1 %
EOS (ABSOLUTE): 0.7 10*3/uL — ABNORMAL HIGH (ref 0.0–0.4)
Eos: 14 %
Hematocrit: 38 % (ref 34.0–46.6)
Hemoglobin: 12.7 g/dL (ref 11.1–15.9)
Immature Grans (Abs): 0 10*3/uL (ref 0.0–0.1)
Immature Granulocytes: 0 %
Lymphocytes Absolute: 1.9 10*3/uL (ref 0.7–3.1)
Lymphs: 37 %
MCH: 32.6 pg (ref 26.6–33.0)
MCHC: 33.4 g/dL (ref 31.5–35.7)
MCV: 97 fL (ref 79–97)
Monocytes Absolute: 0.4 10*3/uL (ref 0.1–0.9)
Monocytes: 7 %
Neutrophils Absolute: 2.1 10*3/uL (ref 1.4–7.0)
Neutrophils: 41 %
Platelets: 192 10*3/uL (ref 150–450)
RBC: 3.9 x10E6/uL (ref 3.77–5.28)
RDW: 12.3 % (ref 11.7–15.4)
WBC: 5.1 10*3/uL (ref 3.4–10.8)

## 2023-05-29 LAB — CMP14+EGFR
ALT: 9 [IU]/L (ref 0–32)
AST: 20 [IU]/L (ref 0–40)
Albumin: 3.9 g/dL (ref 3.7–4.7)
Alkaline Phosphatase: 66 [IU]/L (ref 44–121)
BUN/Creatinine Ratio: 18 (ref 12–28)
BUN: 20 mg/dL (ref 8–27)
Bilirubin Total: 0.4 mg/dL (ref 0.0–1.2)
CO2: 25 mmol/L (ref 20–29)
Calcium: 9.9 mg/dL (ref 8.7–10.3)
Chloride: 101 mmol/L (ref 96–106)
Creatinine, Ser: 1.12 mg/dL — ABNORMAL HIGH (ref 0.57–1.00)
Globulin, Total: 3.6 g/dL (ref 1.5–4.5)
Glucose: 81 mg/dL (ref 70–99)
Potassium: 4.1 mmol/L (ref 3.5–5.2)
Sodium: 143 mmol/L (ref 134–144)
Total Protein: 7.5 g/dL (ref 6.0–8.5)
eGFR: 49 mL/min/{1.73_m2} — ABNORMAL LOW (ref >59)

## 2023-05-29 LAB — QUANTIFERON-TB GOLD PLUS
QuantiFERON Nil Value: 0.07 [IU]/mL
QuantiFERON TB1 Ag Value: 0.04 [IU]/mL
QuantiFERON TB2 Ag Value: 0.05 [IU]/mL

## 2023-05-29 LAB — VITAMIN D 25 HYDROXY (VIT D DEFICIENCY, FRACTURES): Vit D, 25-Hydroxy: 89.3 ng/mL (ref 30.0–100.0)

## 2023-05-30 NOTE — Progress Notes (Signed)
Patient is scheduled for Prolia on 06/09/23  Chesley Mires, PharmD, MPH, BCPS, CPP Clinical Pharmacist (Rheumatology and Pulmonology)

## 2023-05-30 NOTE — Progress Notes (Signed)
TB gold negative

## 2023-06-08 ENCOUNTER — Other Ambulatory Visit (HOSPITAL_COMMUNITY): Payer: Self-pay | Admitting: *Deleted

## 2023-06-08 DIAGNOSIS — Z961 Presence of intraocular lens: Secondary | ICD-10-CM | POA: Diagnosis not present

## 2023-06-09 ENCOUNTER — Ambulatory Visit (HOSPITAL_COMMUNITY)
Admission: RE | Admit: 2023-06-09 | Discharge: 2023-06-09 | Disposition: A | Discharge status: Home / Self Care | Payer: Medicare PPO | Source: Ambulatory Visit | Attending: Rheumatology | Admitting: Rheumatology

## 2023-06-09 DIAGNOSIS — Z79899 Other long term (current) drug therapy: Secondary | ICD-10-CM | POA: Insufficient documentation

## 2023-06-09 DIAGNOSIS — M810 Age-related osteoporosis without current pathological fracture: Secondary | ICD-10-CM | POA: Insufficient documentation

## 2023-06-09 MED ORDER — DENOSUMAB 60 MG/ML ~~LOC~~ SOSY
60.0000 mg | PREFILLED_SYRINGE | Freq: Once | SUBCUTANEOUS | Status: AC
  Administered 2023-06-09: 60 mg via SUBCUTANEOUS

## 2023-06-09 MED ORDER — DENOSUMAB 60 MG/ML ~~LOC~~ SOSY
PREFILLED_SYRINGE | SUBCUTANEOUS | Status: AC
  Filled 2023-06-09: qty 1

## 2023-06-24 ENCOUNTER — Telehealth: Payer: Self-pay | Admitting: Pharmacist

## 2023-06-24 DIAGNOSIS — I129 Hypertensive chronic kidney disease with stage 1 through stage 4 chronic kidney disease, or unspecified chronic kidney disease: Secondary | ICD-10-CM | POA: Diagnosis not present

## 2023-06-24 DIAGNOSIS — J449 Chronic obstructive pulmonary disease, unspecified: Secondary | ICD-10-CM | POA: Diagnosis not present

## 2023-06-24 NOTE — Telephone Encounter (Signed)
Received fax from [facility]. Authorization for PROLIA has been extended from 08/24/2023 through 08/22/2024  Ref # [id number] Phone: [phone]  Chesley Mires, PharmD, MPH, BCPS, CPP Clinical Pharmacist (Rheumatology and Pulmonology)

## 2023-06-27 NOTE — Progress Notes (Signed)
Office Visit Note  Patient: Andrea [name]             Date of Birth: 04-Sep-1940           MRN: [id number]             PCP: Abner Greenspan, MD Referring: Abner Greenspan, MD Visit Date: 07/11/2023 Occupation: @GUAROCC @  Subjective:  Medication monitoring   History of Present Illness: Andrea [name] is a 82 y.o. female with history of seropositive rheumatoid arthritis and osteoporosis.  Patient remains on Enbrel 50 mg sq injections once weekly.  She continues to tolerate Enbrel without any side effects or injection site reactions.  She has not had any gaps in therapy.  Patient currently has a cough but has not yet had to postpone her Enbrel injection.  Patient states that she is up-to-date with her vaccination status.  Patient experiences intermittent discomfort and stiffness in her shoulders and knee joints but denies any joint swelling.  She has a cane and a walker which she can use to assist with ambulation if needed.  She denies any joint swelling at this time. Patient received her Prolia injection on 06/09/2023.  She tolerated the Prolia injection without any side effects.    Activities of Daily Living:  Patient reports morning stiffness for 0 minutes.   Patient Reports nocturnal pain.  Difficulty dressing/grooming: Denies Difficulty climbing stairs: Reports Difficulty getting out of chair: Reports Difficulty using hands for taps, buttons, cutlery, and/or writing: Reports  Review of Systems  Constitutional:  Positive for fatigue.  HENT:  Negative for mouth sores and mouth dryness.   Eyes:  Negative for dryness.  Respiratory:  Positive for cough. Negative for shortness of breath.   Cardiovascular:  Positive for swelling in legs/feet. Negative for chest pain and palpitations.  Gastrointestinal:  Positive for constipation. Negative for blood in stool and diarrhea.  Endocrine: Negative for increased urination.  Genitourinary:  Negative for involuntary urination.  Musculoskeletal:   Positive for gait problem, myalgias, muscle weakness, muscle tenderness and myalgias. Negative for joint pain, joint pain, joint swelling and morning stiffness.  Skin:  Negative for color change, rash, hair loss and sensitivity to sunlight.  Allergic/Immunologic: Negative for susceptible to infections.  Neurological:  Negative for dizziness and headaches.  Hematological:  Negative for swollen glands.  Psychiatric/Behavioral:  Negative for depressed mood and sleep disturbance. The patient is not nervous/anxious.     PMFS History:  Patient Active Problem List   Diagnosis Date Noted   History of hypertension 02/08/2017   History of gastroesophageal reflux (GERD) 02/08/2017   High risk medication use 12/27/2016   Vitamin D deficiency 12/27/2016   History of total knee replacement, bilateral 08/27/2016   Age-related osteoporosis without current pathological fracture 08/27/2016   Seropositive rheumatoid arthritis of multiple sites (HCC) 06/21/2016    Past Medical History:  Diagnosis Date   Arthritis    Cataract    GERD (gastroesophageal reflux disease)    History of colon polyps    Hypercholesterolemia    Hypertension    Osteoporosis     Family History  Problem Relation Age of Onset   Kidney disease Mother    Cancer Father    Bladder Cancer Daughter    Rheum arthritis Daughter    Rheum arthritis Daughter    Stroke Brother    Lung disease Brother    Colon cancer Neg Hx    Esophageal cancer Neg Hx    Rectal cancer Neg Hx  Stomach cancer Neg Hx    Breast cancer Neg Hx    Past Surgical History:  Procedure Laterality Date   CARPAL TUNNEL RELEASE     COLONOSCOPY  12/23/2010   Mild pancolonic diverticulosis. Small internal hemorroids   COLONOSCOPY  08/2019   ESOPHAGOGASTRODUODENOSCOPY  05/09/2013   Hiatal Hernia. Presbyesophagus. Minimal antral gastritis.   GUM SURGERY     JOINT REPLACEMENT     BIL knee    KNEE ARTHROPLASTY     Right knee 2012, Left knee total 2009    TUBAL LIGATION Bilateral    UPPER GI ENDOSCOPY  08/2019   Social History   Social History Narrative   Not on file   Immunization History  Administered Date(s) Administered   Moderna Sars-Covid-2 Vaccination 09/14/2019, 10/12/2019, 07/11/2020     Objective: Vital Signs: BP 133/78 (BP Location: Left Arm, Patient Position: Sitting, Cuff Size: Normal)   Pulse 65   Resp 15   Ht 5\' 2"  (1.575 m)   Wt 154 lb 9.6 oz (70.1 kg)   BMI 28.28 kg/m    Physical Exam Vitals and nursing note reviewed.  Constitutional:      Appearance: She is well-developed.  HENT:     Head: Normocephalic and atraumatic.  Eyes:     Conjunctiva/sclera: Conjunctivae normal.  Cardiovascular:     Rate and Rhythm: Normal rate and regular rhythm.     Heart sounds: Normal heart sounds.  Pulmonary:     Effort: Pulmonary effort is normal.     Breath sounds: Normal breath sounds.  Abdominal:     General: Bowel sounds are normal.     Palpations: Abdomen is soft.  Musculoskeletal:     Cervical back: Normal range of motion.  Lymphadenopathy:     Cervical: No cervical adenopathy.  Skin:    General: Skin is warm and dry.     Capillary Refill: Capillary refill takes less than 2 seconds.  Neurological:     Mental Status: She is alert and oriented to person, place, and time.  Psychiatric:        Behavior: Behavior normal.      Musculoskeletal Exam: C-spine has limited range of motion without rotation.  Thoracic kyphosis noted.  Discomfort with range of motion of both shoulders especially her right shoulder.  Wrist joints have good range of motion.  Ulnar deviation and synovial thickening of MCP joints noted.  No synovitis noted.  Hip joints have good range of motion with no groin pain.  Bilateral knee replacements have good range of motion with no effusion.  Ankle joints have good range of motion with no joint tenderness or synovitis.  CDAI Exam: CDAI Score: -- Patient Global: --; Provider Global: -- Swollen:  --; Tender: -- Joint Exam 07/11/2023   No joint exam has been documented for this visit   There is currently no information documented on the homunculus. Go to the Rheumatology activity and complete the homunculus joint exam.  Investigation: No additional findings.  Imaging: MM 3D SCREENING MAMMOGRAM BILATERAL BREAST  Result Date: 07/08/2023 CLINICAL DATA:  Screening. EXAM: DIGITAL SCREENING BILATERAL MAMMOGRAM WITH TOMOSYNTHESIS AND CAD TECHNIQUE: Bilateral screening digital craniocaudal and mediolateral oblique mammograms were obtained. Bilateral screening digital breast tomosynthesis was performed. The images were evaluated with computer-aided detection. COMPARISON:  Previous exam(s). ACR Breast Density Category b: There are scattered areas of fibroglandular density. FINDINGS: There are no findings suspicious for malignancy. IMPRESSION: No mammographic evidence of malignancy. A result letter of this screening mammogram will be  mailed directly to the patient. RECOMMENDATION: Screening mammogram in one year. (Code:SM-B-01Y) BI-RADS CATEGORY  1: Negative. Electronically Signed   By: Harmon Pier M.D.   On: 07/08/2023 13:47    Recent Labs: Lab Results  Component Value Date   WBC 5.1 05/25/2023   HGB 12.7 05/25/2023   PLT 192 05/25/2023   NA 143 05/25/2023   K 4.1 05/25/2023   CL 101 05/25/2023   CO2 25 05/25/2023   GLUCOSE 81 05/25/2023   BUN 20 05/25/2023   CREATININE 1.12 (H) 05/25/2023   BILITOT 0.4 05/25/2023   ALKPHOS 66 05/25/2023   AST 20 05/25/2023   ALT 9 05/25/2023   PROT 7.5 05/25/2023   ALBUMIN 3.9 05/25/2023   CALCIUM 9.9 05/25/2023   GFRAA 37 (L) 09/17/2020   QFTBGOLDPLUS Negative 05/25/2023    Speciality Comments: TB Gold: 03/25/17 Neg Forteo 07/18-10/20 Reclast November 28, 2012, June 06, 2015, July 06, 2016, June 29, 2019, August 05, 2020  Procedures:  No procedures performed Allergies: Patient has no known allergies.     Assessment / Plan:      Visit Diagnoses: Seropositive rheumatoid arthritis of multiple sites (HCC) - +RF,+anti CCP: She has no synovitis on examination today.  She experiences intermittent aching in both knee replacements and both shoulder joints.  She experiences nocturnal pain in her shoulders at times.  She has not had any recent rheumatoid arthritis flares.  She has clinically been doing well on Enbrel 50 mg subcutaneous injections once weekly.  She is tolerating Enbrel without any side effects or injection site reactions.  She has not had any recurrent infections.  Patient will remain on Enbrel as monotherapy.  She was advised to notify us if she develops any new or worsening symptoms.  She will follow-up in the office in 5 months or sooner if needed.  High risk medication use - Enbrel Sureclick 50 mg sq every 7 days. CBC and CMP updated on 05/25/23.  Her next lab work will be due in January and every 3 months. Standing orders for CBC and CMP remain in place.  TB gold negative on 05/25/23.  Discussed the importance of holding enbrel if she develops signs or symptoms of an infection and to resume once the infection has completely cleared.  Patient is up-to-date with the pneumonia vaccine, COVID-vaccine, flu shot, and RSV vaccine.  Age-related osteoporosis without current pathological fracture - July 05 2021 DEXA scan showed T score -3.1, BMD 0.50 7,1/3 left left distal radius.  3% improvement from 2020. Vitamin D WNL-89.3 on 05/25/23.  She remains on Prolia 60 mg subcutaneous injections every 6 months.  Her last Prolia injection was administered on 06/09/23. Due to update DEXA in November 2024.  Order for DEXA placed today.  Plan: DG BONE DENSITY (DXA)  Vitamin D deficiency: She is taking a vitamin D3 supplement daily.  Subdeltoid bursitis of right shoulder joint: Patient experiences intermittent discomfort and stiffness in both shoulders especially her right shoulder.  She has nocturnal pain at times.  On  examination she has good range of motion today.  Trochanteric bursitis, right hip: Not currently symptomatic.  History of total knee replacement, bilateral: Doing well.  She experiences intermittent discomfort and stiffness in her knee replacements.  She uses compression sleeves as needed for added support.  She has a rollator walker and a cane which she can use as needed to assist with ambulation.  Other medical conditions are listed as follows:  History of hypertension: Blood pressure was 133/78  today in the office.  History of gastroesophageal reflux (GERD)  History of COPD  Orders: Orders Placed This Encounter  Procedures   DG BONE DENSITY (DXA)   No orders of the defined types were placed in this encounter.   Follow-Up Instructions: Return in about 5 months (around 12/09/2023) for Rheumatoid arthritis, Osteoporosis.   Gearldine Bienenstock, PA-C  Note - This record has been created using Dragon software.  Chart creation errors have been sought, but may not always  have been located. Such creation errors do not reflect on  the standard of medical care.

## 2023-07-01 ENCOUNTER — Other Ambulatory Visit: Payer: Self-pay | Admitting: Family Medicine

## 2023-07-01 DIAGNOSIS — Z1231 Encounter for screening mammogram for malignant neoplasm of breast: Secondary | ICD-10-CM

## 2023-07-06 ENCOUNTER — Ambulatory Visit
Admission: RE | Admit: 2023-07-06 | Discharge: 2023-07-06 | Disposition: A | Discharge status: Home / Self Care | Payer: Medicare PPO | Source: Ambulatory Visit | Attending: Family Medicine | Admitting: Family Medicine

## 2023-07-06 DIAGNOSIS — Z1231 Encounter for screening mammogram for malignant neoplasm of breast: Secondary | ICD-10-CM | POA: Diagnosis not present

## 2023-07-11 ENCOUNTER — Telehealth: Payer: Self-pay | Admitting: Pharmacist

## 2023-07-11 ENCOUNTER — Ambulatory Visit: Payer: Medicare PPO | Attending: Physician Assistant | Admitting: Physician Assistant

## 2023-07-11 ENCOUNTER — Encounter: Payer: Self-pay | Admitting: Physician Assistant

## 2023-07-11 VITALS — BP 133/78 | HR 65 | Resp 15 | Ht 62.0 in | Wt 154.6 lb

## 2023-07-11 DIAGNOSIS — M810 Age-related osteoporosis without current pathological fracture: Secondary | ICD-10-CM | POA: Diagnosis not present

## 2023-07-11 DIAGNOSIS — Z8709 Personal history of other diseases of the respiratory system: Secondary | ICD-10-CM

## 2023-07-11 DIAGNOSIS — M0579 Rheumatoid arthritis with rheumatoid factor of multiple sites without organ or systems involvement: Secondary | ICD-10-CM

## 2023-07-11 DIAGNOSIS — Z96653 Presence of artificial knee joint, bilateral: Secondary | ICD-10-CM

## 2023-07-11 DIAGNOSIS — Z79899 Other long term (current) drug therapy: Secondary | ICD-10-CM

## 2023-07-11 DIAGNOSIS — M7551 Bursitis of right shoulder: Secondary | ICD-10-CM | POA: Diagnosis not present

## 2023-07-11 DIAGNOSIS — M7061 Trochanteric bursitis, right hip: Secondary | ICD-10-CM | POA: Diagnosis not present

## 2023-07-11 DIAGNOSIS — Z8679 Personal history of other diseases of the circulatory system: Secondary | ICD-10-CM | POA: Diagnosis not present

## 2023-07-11 DIAGNOSIS — E559 Vitamin D deficiency, unspecified: Secondary | ICD-10-CM

## 2023-07-11 DIAGNOSIS — Z8719 Personal history of other diseases of the digestive system: Secondary | ICD-10-CM | POA: Diagnosis not present

## 2023-07-11 NOTE — Patient Instructions (Signed)

## 2023-07-11 NOTE — Telephone Encounter (Signed)
Received notification from [facility] regarding a prior authorization for ENBREL. Authorization has been APPROVED from 07/10/2022 to 08/22/2024. Approval letter sent to scan center. Attached to PAP application  Auth # [id number]  Chesley Mires, PharmD, MPH, BCPS, CPP Clinical Pharmacist (Rheumatology and Pulmonology)

## 2023-07-11 NOTE — Telephone Encounter (Signed)
Patient had OV today. Provided her with Enbrel PAP renewal application. She did not include household income or household size.  Provider portion placed in Dr. Fatima Sanger folder for signature  Submitted a Prior Authorization renewal request to [facility] for ENBREL via CoverMyMeds. Will update once we receive a response.  Key: [id number]   Chesley Mires, PharmD, MPH, BCPS, CPP Clinical Pharmacist (Rheumatology and Pulmonology)

## 2023-07-13 NOTE — Telephone Encounter (Signed)
Received signed provider form from Dr. Corliss Skains. Called patient and collected household income information over the phone.   Submitted Patient Assistance RENEWAL Application to [employer] for ENBREL along with provider portion, patient portion, PA, medication list, insurance card copy. Will update patient when we receive a response.  Phone #: [phone] Fax #: [phone]  Chesley Mires, PharmD, MPH, BCPS, CPP Clinical Pharmacist (Rheumatology and Pulmonology)

## 2023-07-24 DIAGNOSIS — J449 Chronic obstructive pulmonary disease, unspecified: Secondary | ICD-10-CM | POA: Diagnosis not present

## 2023-07-24 DIAGNOSIS — I129 Hypertensive chronic kidney disease with stage 1 through stage 4 chronic kidney disease, or unspecified chronic kidney disease: Secondary | ICD-10-CM | POA: Diagnosis not present

## 2023-07-27 NOTE — Telephone Encounter (Signed)
Received a fax from  [employer] regarding an approval for ENBREL patient assistance from 08/24/23 to 08/22/24. Approval letter sent to scan center.  Phone #: [phone] Fax #: [phone] Case ID: [id number]  Chesley Mires, PharmD, MPH, BCPS, CPP Clinical Pharmacist (Rheumatology and Pulmonology)

## 2023-08-24 DIAGNOSIS — I129 Hypertensive chronic kidney disease with stage 1 through stage 4 chronic kidney disease, or unspecified chronic kidney disease: Secondary | ICD-10-CM | POA: Diagnosis not present

## 2023-08-24 DIAGNOSIS — J449 Chronic obstructive pulmonary disease, unspecified: Secondary | ICD-10-CM | POA: Diagnosis not present

## 2023-08-26 DIAGNOSIS — M069 Rheumatoid arthritis, unspecified: Secondary | ICD-10-CM | POA: Diagnosis not present

## 2023-08-26 DIAGNOSIS — M8588 Other specified disorders of bone density and structure, other site: Secondary | ICD-10-CM | POA: Diagnosis not present

## 2023-08-26 DIAGNOSIS — M810 Age-related osteoporosis without current pathological fracture: Secondary | ICD-10-CM | POA: Diagnosis not present

## 2023-08-26 LAB — HM DEXA SCAN

## 2023-08-31 ENCOUNTER — Telehealth: Payer: Self-pay | Admitting: *Deleted

## 2023-08-31 NOTE — Telephone Encounter (Signed)
 Received DEXA results from [facility]  Date of Scan: 08/26/2023  Lowest T-score: -3.0  BMD: 0.516  Lowest site measured: 1/3 Left distal radius   DX: Osteoporosis  Significant changes in BMD and site measured (5% and above):n/a  Current Regimen: Prolia  Last injection 06/09/2023, Vitamin D   Recommendation: Repeat DEXA in 2 years  Reviewed by: Dr. Maya Nash  Next Appointment:  12/12/2023  Patient advised of results and recommendations.

## 2023-09-05 DIAGNOSIS — I129 Hypertensive chronic kidney disease with stage 1 through stage 4 chronic kidney disease, or unspecified chronic kidney disease: Secondary | ICD-10-CM | POA: Diagnosis not present

## 2023-09-05 DIAGNOSIS — R7303 Prediabetes: Secondary | ICD-10-CM | POA: Diagnosis not present

## 2023-09-05 DIAGNOSIS — E785 Hyperlipidemia, unspecified: Secondary | ICD-10-CM | POA: Diagnosis not present

## 2023-09-09 ENCOUNTER — Other Ambulatory Visit: Payer: Self-pay | Admitting: *Deleted

## 2023-09-09 DIAGNOSIS — M0579 Rheumatoid arthritis with rheumatoid factor of multiple sites without organ or systems involvement: Secondary | ICD-10-CM

## 2023-09-09 DIAGNOSIS — Z79899 Other long term (current) drug therapy: Secondary | ICD-10-CM

## 2023-09-09 MED ORDER — ENBREL SURECLICK 50 MG/ML ~~LOC~~ SOAJ: 50.0000 mg | SUBCUTANEOUS | 0 refills | Status: DC

## 2023-09-09 NOTE — Telephone Encounter (Signed)
Refill request received via fax from [facility] for Enbrel  Last Fill: 03/07/2023  Labs: 05/25/2023 Absolute eosinophils are elevated. Rest of CBC WNL. We will continue to monitor.  Creatinine remains elevated-1.12 and GFR is low-49  TB Gold: 05/25/2023 Neg    Next Visit: 12/12/2023  Last Visit: 07/11/2023  DX: Seropositive rheumatoid arthritis of multiple sites   Current Dose per office note 07/11/2023: Enbrel Sureclick 50 mg sq every 7 days.   Patient aware she is due to update her labs. Patient will try to update them next week after speaking to her family for transportation.   Okay to refill Enbrel?

## 2023-09-12 DIAGNOSIS — I129 Hypertensive chronic kidney disease with stage 1 through stage 4 chronic kidney disease, or unspecified chronic kidney disease: Secondary | ICD-10-CM | POA: Diagnosis not present

## 2023-09-12 DIAGNOSIS — Z0000 Encounter for general adult medical examination without abnormal findings: Secondary | ICD-10-CM | POA: Diagnosis not present

## 2023-09-12 DIAGNOSIS — E785 Hyperlipidemia, unspecified: Secondary | ICD-10-CM | POA: Diagnosis not present

## 2023-09-12 DIAGNOSIS — N183 Chronic kidney disease, stage 3 unspecified: Secondary | ICD-10-CM | POA: Diagnosis not present

## 2023-09-12 DIAGNOSIS — Z1389 Encounter for screening for other disorder: Secondary | ICD-10-CM | POA: Diagnosis not present

## 2023-09-12 DIAGNOSIS — Z1331 Encounter for screening for depression: Secondary | ICD-10-CM | POA: Diagnosis not present

## 2023-09-12 DIAGNOSIS — Z139 Encounter for screening, unspecified: Secondary | ICD-10-CM | POA: Diagnosis not present

## 2023-09-12 DIAGNOSIS — R7303 Prediabetes: Secondary | ICD-10-CM | POA: Diagnosis not present

## 2023-09-12 DIAGNOSIS — Z1339 Encounter for screening examination for other mental health and behavioral disorders: Secondary | ICD-10-CM | POA: Diagnosis not present

## 2023-09-19 ENCOUNTER — Other Ambulatory Visit: Payer: Self-pay | Admitting: *Deleted

## 2023-09-19 ENCOUNTER — Telehealth: Payer: Self-pay | Admitting: Rheumatology

## 2023-09-19 DIAGNOSIS — Z79899 Other long term (current) drug therapy: Secondary | ICD-10-CM

## 2023-09-19 DIAGNOSIS — Z5181 Encounter for therapeutic drug level monitoring: Secondary | ICD-10-CM

## 2023-09-19 NOTE — Telephone Encounter (Signed)
Correction 09/20/23

## 2023-09-19 NOTE — Telephone Encounter (Signed)
Patient contacted the office requesting lab orders to be be release to [facility].   Patient plans to have labs on 09/19/23.

## 2023-09-19 NOTE — Telephone Encounter (Signed)
Lab Orders released.

## 2023-09-20 DIAGNOSIS — Z5181 Encounter for therapeutic drug level monitoring: Secondary | ICD-10-CM | POA: Diagnosis not present

## 2023-09-20 DIAGNOSIS — Z79899 Other long term (current) drug therapy: Secondary | ICD-10-CM | POA: Diagnosis not present

## 2023-09-21 LAB — LIPID PANEL
Chol/HDL Ratio: 2.2 {ratio} (ref 0.0–4.4)
Cholesterol, Total: 205 mg/dL — ABNORMAL HIGH (ref 100–199)
HDL: 92 mg/dL (ref >39)
LDL Chol Calc (NIH): 105 mg/dL — ABNORMAL HIGH (ref 0–99)
Triglycerides: 41 mg/dL (ref 0–149)
VLDL Cholesterol Cal: 8 mg/dL (ref 5–40)

## 2023-09-21 LAB — CBC WITH DIFFERENTIAL/PLATELET
Basophils Absolute: 0 10*3/uL (ref 0.0–0.2)
Basos: 1 %
EOS (ABSOLUTE): 0.4 10*3/uL (ref 0.0–0.4)
Eos: 9 %
Hematocrit: 36.2 % (ref 34.0–46.6)
Hemoglobin: 12.2 g/dL (ref 11.1–15.9)
Immature Grans (Abs): 0 10*3/uL (ref 0.0–0.1)
Immature Granulocytes: 0 %
Lymphocytes Absolute: 2.2 10*3/uL (ref 0.7–3.1)
Lymphs: 42 %
MCH: 31.7 pg (ref 26.6–33.0)
MCHC: 33.7 g/dL (ref 31.5–35.7)
MCV: 94 fL (ref 79–97)
Monocytes Absolute: 0.4 10*3/uL (ref 0.1–0.9)
Monocytes: 8 %
Neutrophils Absolute: 2 10*3/uL (ref 1.4–7.0)
Neutrophils: 40 %
Platelets: 202 10*3/uL (ref 150–450)
RBC: 3.85 x10E6/uL (ref 3.77–5.28)
RDW: 11.9 % (ref 11.7–15.4)
WBC: 5 10*3/uL (ref 3.4–10.8)

## 2023-09-21 LAB — CMP14+EGFR
ALT: 9 [IU]/L (ref 0–32)
AST: 18 [IU]/L (ref 0–40)
Albumin: 3.7 g/dL (ref 3.7–4.7)
Alkaline Phosphatase: 59 [IU]/L (ref 44–121)
BUN/Creatinine Ratio: 18 (ref 12–28)
BUN: 19 mg/dL (ref 8–27)
Bilirubin Total: 0.4 mg/dL (ref 0.0–1.2)
CO2: 25 mmol/L (ref 20–29)
Calcium: 9.3 mg/dL (ref 8.7–10.3)
Chloride: 102 mmol/L (ref 96–106)
Creatinine, Ser: 1.05 mg/dL — ABNORMAL HIGH (ref 0.57–1.00)
Globulin, Total: 3.3 g/dL (ref 1.5–4.5)
Glucose: 91 mg/dL (ref 70–99)
Potassium: 3.5 mmol/L (ref 3.5–5.2)
Sodium: 140 mmol/L (ref 134–144)
Total Protein: 7 g/dL (ref 6.0–8.5)
eGFR: 52 mL/min/{1.73_m2} — ABNORMAL LOW (ref >59)

## 2023-09-21 NOTE — Progress Notes (Signed)
Creatinine was borderline elevated at 1.05-improved and GFR was 52.  Avoid the use of NSAIDs.  Rest of CMP within normal limits.  CBC within normal limits.

## 2023-09-21 NOTE — Progress Notes (Signed)
Total cholesterol and LDL are elevated.  Please forward results to her PCP.

## 2023-09-24 DIAGNOSIS — I129 Hypertensive chronic kidney disease with stage 1 through stage 4 chronic kidney disease, or unspecified chronic kidney disease: Secondary | ICD-10-CM | POA: Diagnosis not present

## 2023-09-24 DIAGNOSIS — J449 Chronic obstructive pulmonary disease, unspecified: Secondary | ICD-10-CM | POA: Diagnosis not present

## 2023-09-26 ENCOUNTER — Encounter: Payer: Self-pay | Admitting: Physician Assistant

## 2023-10-22 DIAGNOSIS — J449 Chronic obstructive pulmonary disease, unspecified: Secondary | ICD-10-CM | POA: Diagnosis not present

## 2023-10-22 DIAGNOSIS — I129 Hypertensive chronic kidney disease with stage 1 through stage 4 chronic kidney disease, or unspecified chronic kidney disease: Secondary | ICD-10-CM | POA: Diagnosis not present

## 2023-11-07 ENCOUNTER — Telehealth: Payer: Self-pay | Admitting: *Deleted

## 2023-11-07 NOTE — Telephone Encounter (Signed)
 I returned patient's call.  Patient states the swelling and pain is getting better, it was worse on Thursday.  I advised her to take Tylenol as needed.  Patient voiced understanding.

## 2023-11-07 NOTE — Telephone Encounter (Signed)
 Patient called, left hand and left arm swelling and pain, neck pain unable to turn neck, patient was using Voltaren, patient advised not to use NSAIDs based on lab results. Please advise. [facility], [place].

## 2023-11-22 DIAGNOSIS — R7303 Prediabetes: Secondary | ICD-10-CM | POA: Diagnosis not present

## 2023-11-22 DIAGNOSIS — E785 Hyperlipidemia, unspecified: Secondary | ICD-10-CM | POA: Diagnosis not present

## 2023-11-28 NOTE — Progress Notes (Signed)
 Office Visit Note  Patient: Andrea [name]             Date of Birth: 04/19/1941           MRN: [id number]             PCP: Lonie Roa, MD Referring: Lonie Roa, MD Visit Date: 12/12/2023 Occupation: @GUAROCC @  Subjective:  Left hip pain  History of Present Illness: ROBERTO HLAVATY is a 83 y.o. female with history of seropositive rheumatoid arthritis and osteoporosis.  She remains on Enbrel  50 mg sq injections once weekly.  She is tolerating Enbrel  without any side effects or injection site reactions.  She has not had any recent gaps in therapy.  Patient states that 3 weeks ago she had a flare involving the left hand.  She states that during that time she was having significant swelling in the left hand to the point that she had difficulty using her hand.  She denies any identifiable trigger prior to the onset of the flares.  She has not been performing any overuse or repetitive activities.  She denies any injury.  Overall she has been avoiding the use of over-the-counter products but took Tylenol  for several days which alleviated her symptoms.  She experiences intermittent discomfort in her lower back especially on the left side as well as some left hip discomfort at times.  She denies any groin pain at this time.  Patient states both knee replacements are doing well but at times she has anterior shin pain. She denies any recent or recurrent infections. She remains on prolia  60 mg sq injections every 6 months for management of osteoporosis.  She received prolia  earlier this morning.      Activities of Daily Living:  Patient reports morning stiffness for a few minutes.   Patient Reports nocturnal pain.  Difficulty dressing/grooming: Reports Difficulty climbing stairs: Reports Difficulty getting out of chair: Reports Difficulty using hands for taps, buttons, cutlery, and/or writing: Reports  Review of Systems  Constitutional:  Positive for fatigue.  HENT:  Positive for nose dryness.  Negative for mouth sores and mouth dryness.   Eyes:  Negative for pain and dryness.  Respiratory:  Negative for shortness of breath and difficulty breathing.   Cardiovascular:  Negative for chest pain and palpitations.  Gastrointestinal:  Positive for constipation. Negative for blood in stool and diarrhea.  Endocrine: Negative for increased urination.  Genitourinary:  Negative for involuntary urination.  Musculoskeletal:  Positive for joint pain, joint pain, myalgias, morning stiffness, muscle tenderness and myalgias. Negative for gait problem, joint swelling and muscle weakness.  Skin:  Negative for color change, rash, hair loss and sensitivity to sunlight.  Allergic/Immunologic: Negative for susceptible to infections.  Neurological:  Positive for parasthesias. Negative for dizziness, numbness and headaches.  Hematological:  Negative for swollen glands.  Psychiatric/Behavioral:  Positive for sleep disturbance. Negative for depressed mood. The patient is not nervous/anxious.     PMFS History:  Patient Active Problem List   Diagnosis Date Noted   History of hypertension 02/08/2017   History of gastroesophageal reflux (GERD) 02/08/2017   High risk medication use 12/27/2016   Vitamin D  deficiency 12/27/2016   History of total knee replacement, bilateral 08/27/2016   Age-related osteoporosis without current pathological fracture 08/27/2016   Seropositive rheumatoid arthritis of multiple sites (HCC) 06/21/2016    Past Medical History:  Diagnosis Date   Arthritis    Cataract    GERD (gastroesophageal reflux disease)    History  of colon polyps    Hypercholesterolemia    Hypertension    Osteoporosis     Family History  Problem Relation Age of Onset   Kidney disease Mother    Cancer Father    Bladder Cancer Daughter    Rheum arthritis Daughter    Rheum arthritis Daughter    Stroke Brother    Lung disease Brother    Colon cancer Neg Hx    Esophageal cancer Neg Hx    Rectal cancer  Neg Hx    Stomach cancer Neg Hx    Breast cancer Neg Hx    Past Surgical History:  Procedure Laterality Date   CARPAL TUNNEL RELEASE     COLONOSCOPY  12/23/2010   Mild pancolonic diverticulosis. Small internal hemorroids   COLONOSCOPY  08/2019   ESOPHAGOGASTRODUODENOSCOPY  05/09/2013   Hiatal Hernia. Presbyesophagus. Minimal antral gastritis.   GUM SURGERY     JOINT REPLACEMENT     BIL knee    KNEE ARTHROPLASTY     Right knee 2012, Left knee total 2009   TUBAL LIGATION Bilateral    UPPER GI ENDOSCOPY  08/2019   Social History   Social History Narrative   Not on file   Immunization History  Administered Date(s) Administered   Moderna Sars-Covid-2 Vaccination 09/14/2019, 10/12/2019, 07/11/2020     Objective: Vital Signs: BP 130/77 (BP Location: Left Arm, Patient Position: Sitting, Cuff Size: Normal)   Pulse (!) 55   Resp 14   Ht 5\' 2"  (1.575 m)   Wt 156 lb 9.6 oz (71 kg)   BMI 28.64 kg/m    Physical Exam Vitals and nursing note reviewed.  Constitutional:      Appearance: She is well-developed.  HENT:     Head: Normocephalic and atraumatic.  Eyes:     Conjunctiva/sclera: Conjunctivae normal.  Cardiovascular:     Rate and Rhythm: Normal rate and regular rhythm.     Heart sounds: Normal heart sounds.  Pulmonary:     Effort: Pulmonary effort is normal.     Breath sounds: Normal breath sounds.  Abdominal:     General: Bowel sounds are normal.     Palpations: Abdomen is soft.  Musculoskeletal:     Cervical back: Normal range of motion.  Lymphadenopathy:     Cervical: No cervical adenopathy.  Skin:    General: Skin is warm and dry.     Capillary Refill: Capillary refill takes less than 2 seconds.  Neurological:     Mental Status: She is alert and oriented to person, place, and time.  Psychiatric:        Behavior: Behavior normal.      Musculoskeletal Exam: C-spine has limited range of motion with lateral rotation.  Thoracic kyphosis noted.  Discomfort with  range of motion of both shoulders, right greater than left.  Elbow joints have good range of motion with no tenderness along the joint line.  Wrist joints have good range of motion.  Ulnar deviation synovial thickening of all MCP joints but no active synovitis noted.  Hip joints have good range of motion with no groin pain.  No tenderness over the trochanteric bursa at this time.  Both knee replacements have good range of motion with no warmth or effusion.  Ankle joints have good range of motion without tenderness or joint swelling.  CDAI Exam: CDAI Score: -- Patient Global: --; Provider Global: -- Swollen: --; Tender: -- Joint Exam 12/12/2023   No joint exam has been documented for  this visit   There is currently no information documented on the homunculus. Go to the Rheumatology activity and complete the homunculus joint exam.  Investigation: No additional findings.  Imaging: No results found.  Recent Labs: Lab Results  Component Value Date   WBC 4.7 12/06/2023   HGB 12.5 12/06/2023   PLT 177 12/06/2023   NA 143 12/06/2023   K 3.6 12/06/2023   CL 102 12/06/2023   CO2 24 12/06/2023   GLUCOSE 89 12/06/2023   BUN 20 12/06/2023   CREATININE 1.05 (H) 12/06/2023   BILITOT 0.4 12/06/2023   ALKPHOS 61 12/06/2023   AST 21 12/06/2023   ALT 12 12/06/2023   PROT 7.6 12/06/2023   ALBUMIN 4.0 12/06/2023   CALCIUM 9.7 12/06/2023   GFRAA 37 (L) 09/17/2020   QFTBGOLDPLUS Negative 05/25/2023    Speciality Comments: TB Gold: 03/25/17 Neg Forteo  07/18-10/20 Reclast  November 28, 2012, June 06, 2015, July 06, 2016, June 29, 2019, August 05, 2020  Procedures:  No procedures performed Allergies: Patient has no known allergies.    Assessment / Plan:     Visit Diagnoses: Seropositive rheumatoid arthritis of multiple sites (HCC) - +RF,+anti CCP: She has no joint tenderness or synovitis on examination today.  Patient is currently on Enbrel  50 mg subcu days injections every 7 days.   She is tolerating Enbrel  without any side effects or injection site reactions.  She has not had any recent gaps in therapy.  About 3 weeks ago she had a flare involving the left hand at which time she was having difficulty using her left hand due to the severity of inflammation.  She was unable to identify a trigger for this flare but did Tylenol  for several days and her symptoms resolved with no recurrence. No synovitis was noted on examination today. She has not been performing any overuse or repetitive activities.  No recent gaps in therapy.  She has occasional discomfort in the left side of her hip but has no groin pain.  No tenderness over the left trochanteric bursa is noted. She was advised to notify us  if she starts to have more frequent flares.  For now she will remain on Enbrel  50 mg subcutaneous injections every 7 days.  She will follow-up in the office in 5 months or sooner if needed. - Plan: etanercept  (ENBREL  SURECLICK) 50 MG/ML injection  High risk medication use - Enbrel  Sureclick 50 mg sq every 7 days. TB gold negative on 05/25/23.  CBC and CMP updated on 12/06/23. Her next lab work will be due in July and every 3 months. No recent or recurrent infections.  Discussed the importance of holding enbrel  if she develops signs or symptoms of an infection and to resume once the infection has completely cleared.  Patient is up-to-date with the pneumonia vaccine, COVID-vaccine, flu shot, and RSV vaccine. Plan: CBC with Differential/Platelet, Comprehensive metabolic panel with GFR  Age-related osteoporosis without current pathological fracture: Previous DEXA 07/06/23: T score -3.1, BMD 0.50 7,1/3 left left distal radius.  3% improvement from 2020. Vitamin D  WNL-89.3 on 05/25/23.  DEXA updated on 08/26/23: 1/3 left distal radius BMD 0.516 T-score -3.0.  all scanned sites are stable.   She remains on Prolia  60 mg subcutaneous injections every 6 months.  Her most recent prolia  dose was administered  today on 12/12/23.    Vitamin D  deficiency: She is taking a daily vitamin D  supplement.   Medication monitoring encounter: Prolia  administered today.  No complications or side effects.  Subdeltoid bursitis of right shoulder joint: Intermittent discomfort.    Trochanteric bursitis, right hip: Not currently symptomatic.   History of total knee replacement, bilateral: Doing well. Under the care of Dr. Genevive Ket.   Other medical conditions are listed as follows:  History of hypertension: Blood pressure was 130/77 today in the office.  History of gastroesophageal reflux (GERD)  History of COPD  Orders: Orders Placed This Encounter  Procedures   CBC with Differential/Platelet   Comprehensive metabolic panel with GFR   Meds ordered this encounter  Medications   etanercept  (ENBREL  SURECLICK) 50 MG/ML injection    Sig: Inject 50 mg into the skin once a week.    Dispense:  12 mL    Refill:  0      Follow-Up Instructions: Return in about 5 months (around 05/13/2024) for Rheumatoid arthritis.   Romayne Clubs, PA-C  Note - This record has been created using Dragon software.  Chart creation errors have been sought, but may not always  have been located. Such creation errors do not reflect on  the standard of medical care.

## 2023-11-30 ENCOUNTER — Telehealth: Payer: Self-pay | Admitting: Pharmacist

## 2023-11-30 DIAGNOSIS — M810 Age-related osteoporosis without current pathological fracture: Secondary | ICD-10-CM

## 2023-11-30 DIAGNOSIS — Z5181 Encounter for therapeutic drug level monitoring: Secondary | ICD-10-CM

## 2023-11-30 DIAGNOSIS — Z79899 Other long term (current) drug therapy: Secondary | ICD-10-CM

## 2023-11-30 NOTE — Telephone Encounter (Signed)
 Prolia due 12/06/2023. Patient states she will gets labs completed next week. Will release labs on Monday for [facility] in [place] (CBC/CMET)  Authorization for Prolia is approved through [facility] and she has AARP as supplemental. Order can be placed for Medical Day once approved  Chesley Mires, PharmD, MPH, BCPS, CPP Clinical Pharmacist (Rheumatology and Pulmonology)

## 2023-12-05 NOTE — Telephone Encounter (Signed)
 CBC and CMET released for [facility]  Geraldene Kleine, PharmD, MPH, BCPS, CPP Clinical Pharmacist (Rheumatology and Pulmonology)

## 2023-12-06 DIAGNOSIS — Z5181 Encounter for therapeutic drug level monitoring: Secondary | ICD-10-CM | POA: Diagnosis not present

## 2023-12-06 DIAGNOSIS — M0579 Rheumatoid arthritis with rheumatoid factor of multiple sites without organ or systems involvement: Secondary | ICD-10-CM | POA: Diagnosis not present

## 2023-12-06 DIAGNOSIS — Z79899 Other long term (current) drug therapy: Secondary | ICD-10-CM | POA: Diagnosis not present

## 2023-12-06 DIAGNOSIS — M810 Age-related osteoporosis without current pathological fracture: Secondary | ICD-10-CM | POA: Diagnosis not present

## 2023-12-07 ENCOUNTER — Other Ambulatory Visit: Payer: Self-pay | Admitting: Pharmacist

## 2023-12-07 DIAGNOSIS — M810 Age-related osteoporosis without current pathological fracture: Secondary | ICD-10-CM

## 2023-12-07 LAB — COMPREHENSIVE METABOLIC PANEL WITH GFR
ALT: 12 IU/L (ref 0–32)
AST: 21 IU/L (ref 0–40)
Albumin: 4 g/dL (ref 3.7–4.7)
Alkaline Phosphatase: 61 IU/L (ref 44–121)
BUN/Creatinine Ratio: 19 (ref 12–28)
BUN: 20 mg/dL (ref 8–27)
Bilirubin Total: 0.4 mg/dL (ref 0.0–1.2)
CO2: 24 mmol/L (ref 20–29)
Calcium: 9.7 mg/dL (ref 8.7–10.3)
Chloride: 102 mmol/L (ref 96–106)
Creatinine, Ser: 1.05 mg/dL — ABNORMAL HIGH (ref 0.57–1.00)
Globulin, Total: 3.6 g/dL (ref 1.5–4.5)
Glucose: 89 mg/dL (ref 70–99)
Potassium: 3.6 mmol/L (ref 3.5–5.2)
Sodium: 143 mmol/L (ref 134–144)
Total Protein: 7.6 g/dL (ref 6.0–8.5)
eGFR: 52 mL/min/{1.73_m2} — ABNORMAL LOW (ref >59)

## 2023-12-07 LAB — CBC WITH DIFFERENTIAL/PLATELET
Basophils Absolute: 0 10*3/uL (ref 0.0–0.2)
Basos: 1 %
EOS (ABSOLUTE): 0.5 10*3/uL — ABNORMAL HIGH (ref 0.0–0.4)
Eos: 10 %
Hematocrit: 35.6 % (ref 34.0–46.6)
Hemoglobin: 12.5 g/dL (ref 11.1–15.9)
Immature Grans (Abs): 0 10*3/uL (ref 0.0–0.1)
Immature Granulocytes: 0 %
Lymphocytes Absolute: 2.3 10*3/uL (ref 0.7–3.1)
Lymphs: 49 %
MCH: 33.8 pg — ABNORMAL HIGH (ref 26.6–33.0)
MCHC: 35.1 g/dL (ref 31.5–35.7)
MCV: 96 fL (ref 79–97)
Monocytes Absolute: 0.3 10*3/uL (ref 0.1–0.9)
Monocytes: 7 %
Neutrophils Absolute: 1.5 10*3/uL (ref 1.4–7.0)
Neutrophils: 33 %
Platelets: 177 10*3/uL (ref 150–450)
RBC: 3.7 x10E6/uL — ABNORMAL LOW (ref 3.77–5.28)
RDW: 12.1 % (ref 11.7–15.4)
WBC: 4.7 10*3/uL (ref 3.4–10.8)

## 2023-12-07 NOTE — Progress Notes (Signed)
 CBC stable.  CMP shows elevated creatinine which is also stable.  Please forward results to her PCP.

## 2023-12-07 NOTE — Progress Notes (Signed)
 Next Prolia SQ due on 12/06/2023. Diagnosis: age-related osteoporosis  Dose: 60 mg SQ every 6 months  Last Clinic Visit: 07/11/2023 Next Clinic Visit: 12/12/23  Last Prolia dose: 06/09/2023  Labs: 12/06/2022 - WNL Last DEXA: 08/26/2023 Next DEXA due: Jan 2027  Orders placed for Prolia x 1 dose. No premedicatons required.   Called patient and provided with phone number for: [facility] [phone]) Jonette Nestle  Will follow-up to ensured scheduled and completed.  Geraldene Kleine, PharmD, MPH, BCPS, CPP Clinical Pharmacist (Rheumatology and Pulmonology)

## 2023-12-12 ENCOUNTER — Ambulatory Visit (HOSPITAL_COMMUNITY)
Admission: RE | Admit: 2023-12-12 | Discharge: 2023-12-12 | Disposition: A | Discharge status: Home / Self Care | Source: Ambulatory Visit | Attending: Rheumatology | Admitting: Rheumatology

## 2023-12-12 ENCOUNTER — Encounter: Payer: Self-pay | Admitting: Physician Assistant

## 2023-12-12 ENCOUNTER — Ambulatory Visit (INDEPENDENT_AMBULATORY_CARE_PROVIDER_SITE_OTHER): Payer: Medicare PPO | Admitting: Physician Assistant

## 2023-12-12 VITALS — BP 130/77 | HR 55 | Resp 14 | Ht 62.0 in | Wt 156.6 lb

## 2023-12-12 DIAGNOSIS — M7061 Trochanteric bursitis, right hip: Secondary | ICD-10-CM

## 2023-12-12 DIAGNOSIS — Z79899 Other long term (current) drug therapy: Secondary | ICD-10-CM

## 2023-12-12 DIAGNOSIS — Z96653 Presence of artificial knee joint, bilateral: Secondary | ICD-10-CM | POA: Insufficient documentation

## 2023-12-12 DIAGNOSIS — Z5181 Encounter for therapeutic drug level monitoring: Secondary | ICD-10-CM | POA: Insufficient documentation

## 2023-12-12 DIAGNOSIS — M7551 Bursitis of right shoulder: Secondary | ICD-10-CM | POA: Diagnosis not present

## 2023-12-12 DIAGNOSIS — M0579 Rheumatoid arthritis with rheumatoid factor of multiple sites without organ or systems involvement: Secondary | ICD-10-CM | POA: Diagnosis not present

## 2023-12-12 DIAGNOSIS — Z8719 Personal history of other diseases of the digestive system: Secondary | ICD-10-CM | POA: Diagnosis present

## 2023-12-12 DIAGNOSIS — Z8679 Personal history of other diseases of the circulatory system: Secondary | ICD-10-CM | POA: Diagnosis not present

## 2023-12-12 DIAGNOSIS — E559 Vitamin D deficiency, unspecified: Secondary | ICD-10-CM

## 2023-12-12 DIAGNOSIS — Z8709 Personal history of other diseases of the respiratory system: Secondary | ICD-10-CM | POA: Diagnosis present

## 2023-12-12 DIAGNOSIS — M810 Age-related osteoporosis without current pathological fracture: Secondary | ICD-10-CM | POA: Insufficient documentation

## 2023-12-12 MED ORDER — DENOSUMAB 60 MG/ML ~~LOC~~ SOSY
60.0000 mg | PREFILLED_SYRINGE | Freq: Once | SUBCUTANEOUS | Status: AC
  Administered 2023-12-12: 60 mg via SUBCUTANEOUS

## 2023-12-12 MED ORDER — ENBREL SURECLICK 50 MG/ML ~~LOC~~ SOAJ: 50.0000 mg | SUBCUTANEOUS | 0 refills | Status: DC

## 2023-12-12 MED ORDER — DENOSUMAB 60 MG/ML ~~LOC~~ SOSY
PREFILLED_SYRINGE | SUBCUTANEOUS | Status: AC
  Filled 2023-12-12: qty 1

## 2023-12-12 NOTE — Patient Instructions (Signed)
Standing Labs We placed an order today for your standing lab work.   Please have your standing labs drawn in mid-July and every 3 months   Please have your labs drawn 2 weeks prior to your appointment so that the provider can discuss your lab results at your appointment, if possible.  Please note that you may see your imaging and lab results in MyChart before we have reviewed them. We will contact you once all results are reviewed. Please allow our office up to 72 hours to thoroughly review all of the results before contacting the office for clarification of your results.  WALK-IN LAB HOURS  Monday through Thursday from 8:00 am -12:30 pm and 1:00 pm-5:00 pm and Friday from 8:00 am-12:00 pm.  Patients with office visits requiring labs will be seen before walk-in labs.  You may encounter longer than normal wait times. Please allow additional time. Wait times may be shorter on  Monday and Thursday afternoons.  We do not book appointments for walk-in labs. We appreciate your patience and understanding with our staff.   Labs are drawn by [facility]. Please bring your co-pay at the time of your lab draw.  You may receive a bill from [facility] for your lab work.  Please note if you are on Hydroxychloroquine and and an order has been placed for a Hydroxychloroquine level,  you will need to have it drawn 4 hours or more after your last dose.  If you wish to have your labs drawn at another location, please call the office 24 hours in advance so we can fax the orders.  The office is located at [address], [address], [place], [place] [address]   If you have any questions regarding directions or hours of operation,  please call [phone].   As a reminder, please drink plenty of water prior to coming for your lab work. Thanks!

## 2023-12-22 DIAGNOSIS — R7303 Prediabetes: Secondary | ICD-10-CM | POA: Diagnosis not present

## 2023-12-22 DIAGNOSIS — E785 Hyperlipidemia, unspecified: Secondary | ICD-10-CM | POA: Diagnosis not present

## 2024-01-09 DIAGNOSIS — N183 Chronic kidney disease, stage 3 unspecified: Secondary | ICD-10-CM | POA: Diagnosis not present

## 2024-01-09 DIAGNOSIS — M810 Age-related osteoporosis without current pathological fracture: Secondary | ICD-10-CM | POA: Diagnosis not present

## 2024-01-09 DIAGNOSIS — Z6827 Body mass index (BMI) 27.0-27.9, adult: Secondary | ICD-10-CM | POA: Diagnosis not present

## 2024-01-09 DIAGNOSIS — I129 Hypertensive chronic kidney disease with stage 1 through stage 4 chronic kidney disease, or unspecified chronic kidney disease: Secondary | ICD-10-CM | POA: Diagnosis not present

## 2024-01-09 DIAGNOSIS — N1831 Chronic kidney disease, stage 3a: Secondary | ICD-10-CM | POA: Diagnosis not present

## 2024-01-12 DIAGNOSIS — M6281 Muscle weakness (generalized): Secondary | ICD-10-CM | POA: Diagnosis not present

## 2024-01-12 DIAGNOSIS — R262 Difficulty in walking, not elsewhere classified: Secondary | ICD-10-CM | POA: Diagnosis not present

## 2024-01-18 DIAGNOSIS — M6281 Muscle weakness (generalized): Secondary | ICD-10-CM | POA: Diagnosis not present

## 2024-01-18 DIAGNOSIS — Z9181 History of falling: Secondary | ICD-10-CM | POA: Diagnosis not present

## 2024-01-18 DIAGNOSIS — R262 Difficulty in walking, not elsewhere classified: Secondary | ICD-10-CM | POA: Diagnosis not present

## 2024-01-21 DIAGNOSIS — I129 Hypertensive chronic kidney disease with stage 1 through stage 4 chronic kidney disease, or unspecified chronic kidney disease: Secondary | ICD-10-CM | POA: Diagnosis not present

## 2024-01-22 DIAGNOSIS — N1831 Chronic kidney disease, stage 3a: Secondary | ICD-10-CM | POA: Diagnosis not present

## 2024-01-22 DIAGNOSIS — R7303 Prediabetes: Secondary | ICD-10-CM | POA: Diagnosis not present

## 2024-01-22 DIAGNOSIS — I129 Hypertensive chronic kidney disease with stage 1 through stage 4 chronic kidney disease, or unspecified chronic kidney disease: Secondary | ICD-10-CM | POA: Diagnosis not present

## 2024-01-24 DIAGNOSIS — R262 Difficulty in walking, not elsewhere classified: Secondary | ICD-10-CM | POA: Diagnosis not present

## 2024-01-24 DIAGNOSIS — Z9181 History of falling: Secondary | ICD-10-CM | POA: Diagnosis not present

## 2024-01-24 DIAGNOSIS — M6281 Muscle weakness (generalized): Secondary | ICD-10-CM | POA: Diagnosis not present

## 2024-01-26 DIAGNOSIS — M6281 Muscle weakness (generalized): Secondary | ICD-10-CM | POA: Diagnosis not present

## 2024-01-26 DIAGNOSIS — R262 Difficulty in walking, not elsewhere classified: Secondary | ICD-10-CM | POA: Diagnosis not present

## 2024-01-26 DIAGNOSIS — Z9181 History of falling: Secondary | ICD-10-CM | POA: Diagnosis not present

## 2024-01-31 DIAGNOSIS — Z9181 History of falling: Secondary | ICD-10-CM | POA: Diagnosis not present

## 2024-01-31 DIAGNOSIS — M6281 Muscle weakness (generalized): Secondary | ICD-10-CM | POA: Diagnosis not present

## 2024-01-31 DIAGNOSIS — R262 Difficulty in walking, not elsewhere classified: Secondary | ICD-10-CM | POA: Diagnosis not present

## 2024-02-02 DIAGNOSIS — Z9181 History of falling: Secondary | ICD-10-CM | POA: Diagnosis not present

## 2024-02-02 DIAGNOSIS — M6281 Muscle weakness (generalized): Secondary | ICD-10-CM | POA: Diagnosis not present

## 2024-02-02 DIAGNOSIS — R262 Difficulty in walking, not elsewhere classified: Secondary | ICD-10-CM | POA: Diagnosis not present

## 2024-02-06 DIAGNOSIS — M6281 Muscle weakness (generalized): Secondary | ICD-10-CM | POA: Diagnosis not present

## 2024-02-06 DIAGNOSIS — R262 Difficulty in walking, not elsewhere classified: Secondary | ICD-10-CM | POA: Diagnosis not present

## 2024-02-06 DIAGNOSIS — Z9181 History of falling: Secondary | ICD-10-CM | POA: Diagnosis not present

## 2024-02-07 DIAGNOSIS — E785 Hyperlipidemia, unspecified: Secondary | ICD-10-CM | POA: Diagnosis not present

## 2024-02-07 DIAGNOSIS — I129 Hypertensive chronic kidney disease with stage 1 through stage 4 chronic kidney disease, or unspecified chronic kidney disease: Secondary | ICD-10-CM | POA: Diagnosis not present

## 2024-02-07 DIAGNOSIS — R7303 Prediabetes: Secondary | ICD-10-CM | POA: Diagnosis not present

## 2024-02-08 DIAGNOSIS — Z9181 History of falling: Secondary | ICD-10-CM | POA: Diagnosis not present

## 2024-02-08 DIAGNOSIS — R262 Difficulty in walking, not elsewhere classified: Secondary | ICD-10-CM | POA: Diagnosis not present

## 2024-02-08 DIAGNOSIS — M6281 Muscle weakness (generalized): Secondary | ICD-10-CM | POA: Diagnosis not present

## 2024-02-13 DIAGNOSIS — N1831 Chronic kidney disease, stage 3a: Secondary | ICD-10-CM | POA: Diagnosis not present

## 2024-02-13 DIAGNOSIS — E785 Hyperlipidemia, unspecified: Secondary | ICD-10-CM | POA: Diagnosis not present

## 2024-02-13 DIAGNOSIS — Z6827 Body mass index (BMI) 27.0-27.9, adult: Secondary | ICD-10-CM | POA: Diagnosis not present

## 2024-02-13 DIAGNOSIS — R7303 Prediabetes: Secondary | ICD-10-CM | POA: Diagnosis not present

## 2024-02-14 DIAGNOSIS — R262 Difficulty in walking, not elsewhere classified: Secondary | ICD-10-CM | POA: Diagnosis not present

## 2024-02-14 DIAGNOSIS — M6281 Muscle weakness (generalized): Secondary | ICD-10-CM | POA: Diagnosis not present

## 2024-02-14 DIAGNOSIS — Z9181 History of falling: Secondary | ICD-10-CM | POA: Diagnosis not present

## 2024-02-16 DIAGNOSIS — R262 Difficulty in walking, not elsewhere classified: Secondary | ICD-10-CM | POA: Diagnosis not present

## 2024-02-16 DIAGNOSIS — Z9181 History of falling: Secondary | ICD-10-CM | POA: Diagnosis not present

## 2024-02-16 DIAGNOSIS — M6281 Muscle weakness (generalized): Secondary | ICD-10-CM | POA: Diagnosis not present

## 2024-02-20 DIAGNOSIS — R262 Difficulty in walking, not elsewhere classified: Secondary | ICD-10-CM | POA: Diagnosis not present

## 2024-02-20 DIAGNOSIS — Z9181 History of falling: Secondary | ICD-10-CM | POA: Diagnosis not present

## 2024-02-20 DIAGNOSIS — I129 Hypertensive chronic kidney disease with stage 1 through stage 4 chronic kidney disease, or unspecified chronic kidney disease: Secondary | ICD-10-CM | POA: Diagnosis not present

## 2024-02-20 DIAGNOSIS — M6281 Muscle weakness (generalized): Secondary | ICD-10-CM | POA: Diagnosis not present

## 2024-02-21 DIAGNOSIS — R7303 Prediabetes: Secondary | ICD-10-CM | POA: Diagnosis not present

## 2024-02-21 DIAGNOSIS — E785 Hyperlipidemia, unspecified: Secondary | ICD-10-CM | POA: Diagnosis not present

## 2024-02-21 DIAGNOSIS — I129 Hypertensive chronic kidney disease with stage 1 through stage 4 chronic kidney disease, or unspecified chronic kidney disease: Secondary | ICD-10-CM | POA: Diagnosis not present

## 2024-02-22 DIAGNOSIS — M6281 Muscle weakness (generalized): Secondary | ICD-10-CM | POA: Diagnosis not present

## 2024-02-22 DIAGNOSIS — R262 Difficulty in walking, not elsewhere classified: Secondary | ICD-10-CM | POA: Diagnosis not present

## 2024-02-22 DIAGNOSIS — Z9181 History of falling: Secondary | ICD-10-CM | POA: Diagnosis not present

## 2024-02-28 DIAGNOSIS — Z9181 History of falling: Secondary | ICD-10-CM | POA: Diagnosis not present

## 2024-02-28 DIAGNOSIS — R262 Difficulty in walking, not elsewhere classified: Secondary | ICD-10-CM | POA: Diagnosis not present

## 2024-02-28 DIAGNOSIS — M6281 Muscle weakness (generalized): Secondary | ICD-10-CM | POA: Diagnosis not present

## 2024-03-01 DIAGNOSIS — M6281 Muscle weakness (generalized): Secondary | ICD-10-CM | POA: Diagnosis not present

## 2024-03-01 DIAGNOSIS — R262 Difficulty in walking, not elsewhere classified: Secondary | ICD-10-CM | POA: Diagnosis not present

## 2024-03-01 DIAGNOSIS — Z9181 History of falling: Secondary | ICD-10-CM | POA: Diagnosis not present

## 2024-03-06 DIAGNOSIS — M6281 Muscle weakness (generalized): Secondary | ICD-10-CM | POA: Diagnosis not present

## 2024-03-06 DIAGNOSIS — Z9181 History of falling: Secondary | ICD-10-CM | POA: Diagnosis not present

## 2024-03-06 DIAGNOSIS — R262 Difficulty in walking, not elsewhere classified: Secondary | ICD-10-CM | POA: Diagnosis not present

## 2024-03-08 DIAGNOSIS — M6281 Muscle weakness (generalized): Secondary | ICD-10-CM | POA: Diagnosis not present

## 2024-03-08 DIAGNOSIS — R262 Difficulty in walking, not elsewhere classified: Secondary | ICD-10-CM | POA: Diagnosis not present

## 2024-03-08 DIAGNOSIS — Z9181 History of falling: Secondary | ICD-10-CM | POA: Diagnosis not present

## 2024-03-13 DIAGNOSIS — M6281 Muscle weakness (generalized): Secondary | ICD-10-CM | POA: Diagnosis not present

## 2024-03-13 DIAGNOSIS — Z9181 History of falling: Secondary | ICD-10-CM | POA: Diagnosis not present

## 2024-03-13 DIAGNOSIS — R262 Difficulty in walking, not elsewhere classified: Secondary | ICD-10-CM | POA: Diagnosis not present

## 2024-03-14 DIAGNOSIS — N1831 Chronic kidney disease, stage 3a: Secondary | ICD-10-CM | POA: Diagnosis not present

## 2024-03-15 DIAGNOSIS — R262 Difficulty in walking, not elsewhere classified: Secondary | ICD-10-CM | POA: Diagnosis not present

## 2024-03-15 DIAGNOSIS — M6281 Muscle weakness (generalized): Secondary | ICD-10-CM | POA: Diagnosis not present

## 2024-03-15 DIAGNOSIS — Z9181 History of falling: Secondary | ICD-10-CM | POA: Diagnosis not present

## 2024-03-22 DIAGNOSIS — R109 Unspecified abdominal pain: Secondary | ICD-10-CM | POA: Diagnosis not present

## 2024-03-22 DIAGNOSIS — Z6828 Body mass index (BMI) 28.0-28.9, adult: Secondary | ICD-10-CM | POA: Diagnosis not present

## 2024-03-22 DIAGNOSIS — I129 Hypertensive chronic kidney disease with stage 1 through stage 4 chronic kidney disease, or unspecified chronic kidney disease: Secondary | ICD-10-CM | POA: Diagnosis not present

## 2024-03-23 DIAGNOSIS — E785 Hyperlipidemia, unspecified: Secondary | ICD-10-CM | POA: Diagnosis not present

## 2024-03-23 DIAGNOSIS — I129 Hypertensive chronic kidney disease with stage 1 through stage 4 chronic kidney disease, or unspecified chronic kidney disease: Secondary | ICD-10-CM | POA: Diagnosis not present

## 2024-04-22 DIAGNOSIS — I129 Hypertensive chronic kidney disease with stage 1 through stage 4 chronic kidney disease, or unspecified chronic kidney disease: Secondary | ICD-10-CM | POA: Diagnosis not present

## 2024-04-23 DIAGNOSIS — E785 Hyperlipidemia, unspecified: Secondary | ICD-10-CM | POA: Diagnosis not present

## 2024-04-23 DIAGNOSIS — I129 Hypertensive chronic kidney disease with stage 1 through stage 4 chronic kidney disease, or unspecified chronic kidney disease: Secondary | ICD-10-CM | POA: Diagnosis not present

## 2024-04-30 NOTE — Progress Notes (Signed)
 Office Visit Note  Patient: Andrea [name]             Date of Birth: Sep 06, 1940           MRN: [id number]             PCP: Trinidad Hun, MD Referring: Trinidad Hun, MD Visit Date: 05/14/2024 Occupation: @GUAROCC @  Subjective:  Recent flare   History of Present Illness: Andrea [name] is a 83 y.o. female with history of seropositive rheumatoid arthritis and osteoporosis.  Patient remains on  Enbrel  Sureclick 50 mg sq every 7 days.  Patient reports that she had a flare involving both hands and both wrists about 2 weeks ago.  Patient states that she was 1 week overdue for her Enbrel  dose which she attributes to the flare.  She denies any overuse or repetitive activities prior to the onset of symptoms.  Patient states that she is having significant swelling and her difficulty using her hands due to severity of symptoms.  She is also experiencing soreness in both shoulders.  Patient states that her symptoms lasted for about 4 to 5 days.  She tried tylenol  1 dose of tylenol  for symptomatic relief.  She denies any recent or recurrent infections.  She denies any new medical conditions.  Activities of Daily Living:  Patient reports morning stiffness for 0 minute.   Patient Reports nocturnal pain.  Difficulty dressing/grooming: Denies Difficulty climbing stairs: Reports Difficulty getting out of chair: Reports Difficulty using hands for taps, buttons, cutlery, and/or writing: Reports  Review of Systems  Constitutional:  Negative for fatigue.  HENT:  Negative for mouth sores and mouth dryness.   Eyes:  Negative for dryness.  Respiratory:  Positive for shortness of breath.   Cardiovascular:  Negative for chest pain and palpitations.  Gastrointestinal:  Positive for constipation. Negative for blood in stool and diarrhea.  Endocrine: Negative for increased urination.  Genitourinary:  Negative for involuntary urination.  Musculoskeletal:  Positive for joint pain, gait problem, joint pain, joint  swelling, myalgias, muscle weakness, muscle tenderness and myalgias. Negative for morning stiffness.  Skin:  Positive for hair loss. Negative for color change, rash and sensitivity to sunlight.  Allergic/Immunologic: Negative for susceptible to infections.  Neurological:  Positive for dizziness. Negative for headaches.  Hematological:  Negative for swollen glands.  Psychiatric/Behavioral:  Positive for sleep disturbance. Negative for depressed mood. The patient is not nervous/anxious.     PMFS History:  Patient Active Problem List   Diagnosis Date Noted   History of hypertension 02/08/2017   History of gastroesophageal reflux (GERD) 02/08/2017   High risk medication use 12/27/2016   Vitamin D  deficiency 12/27/2016   History of total knee replacement, bilateral 08/27/2016   Age-related osteoporosis without current pathological fracture 08/27/2016   Seropositive rheumatoid arthritis of multiple sites (HCC) 06/21/2016    Past Medical History:  Diagnosis Date   Arthritis    Cataract    GERD (gastroesophageal reflux disease)    History of colon polyps    Hypercholesterolemia    Hypertension    Osteoporosis     Family History  Problem Relation Age of Onset   Kidney disease Mother    Cancer Father    Bladder Cancer Daughter    Rheum arthritis Daughter    Rheum arthritis Daughter    Stroke Brother    Lung disease Brother    Colon cancer Neg Hx    Esophageal cancer Neg Hx    Rectal cancer Neg Hx  Stomach cancer Neg Hx    Breast cancer Neg Hx    Past Surgical History:  Procedure Laterality Date   CARPAL TUNNEL RELEASE     COLONOSCOPY  12/23/2010   Mild pancolonic diverticulosis. Small internal hemorroids   COLONOSCOPY  08/2019   ESOPHAGOGASTRODUODENOSCOPY  05/09/2013   Hiatal Hernia. Presbyesophagus. Minimal antral gastritis.   GUM SURGERY     JOINT REPLACEMENT     BIL knee    KNEE ARTHROPLASTY     Right knee 2012, Left knee total 2009   TUBAL LIGATION Bilateral     UPPER GI ENDOSCOPY  08/2019   Social History   Social History Narrative   Not on file   Immunization History  Administered Date(s) Administered   Moderna Sars-Covid-2 Vaccination 09/14/2019, 10/12/2019, 07/11/2020     Objective: Vital Signs: BP 138/79   Pulse (!) 54   Temp 97.7 F (36.5 C)   Resp 14   Ht 5' 2 (1.575 m)   Wt 155 lb 9.6 oz (70.6 kg)   BMI 28.46 kg/m    Physical Exam Vitals and nursing note reviewed.  Constitutional:      Appearance: She is well-developed.  HENT:     Head: Normocephalic and atraumatic.  Eyes:     Conjunctiva/sclera: Conjunctivae normal.  Cardiovascular:     Rate and Rhythm: Normal rate and regular rhythm.     Heart sounds: Normal heart sounds.  Pulmonary:     Effort: Pulmonary effort is normal.     Breath sounds: Normal breath sounds.  Abdominal:     General: Bowel sounds are normal.     Palpations: Abdomen is soft.  Musculoskeletal:     Cervical back: Normal range of motion.  Lymphadenopathy:     Cervical: No cervical adenopathy.  Skin:    General: Skin is warm and dry.     Capillary Refill: Capillary refill takes less than 2 seconds.  Neurological:     Mental Status: She is alert and oriented to person, place, and time.  Psychiatric:        Behavior: Behavior normal.      Musculoskeletal Exam: C-spine is limited range of motion without rotation.  Thoracic kyphosis noted.  Discomfort range of motion of both shoulders.  Elbow joints have good range of motion no tenderness along the joint line.  Wrist joints have good range of motion with mild tenderness bilaterally.  Ulnar deviation and synovial thickening of all MCP joints but no active synovitis noted.  Hip joints have good range of motion with no groin pain.  Both knee replacements have good range of motion no warmth or effusion.  Ankle joints have good range of motion with no tenderness or joint swelling.  CDAI Exam: CDAI Score: -- Patient Global: --; Provider Global:  -- Swollen: --; Tender: -- Joint Exam 05/14/2024   No joint exam has been documented for this visit   There is currently no information documented on the homunculus. Go to the Rheumatology activity and complete the homunculus joint exam.  Investigation: No additional findings.  Imaging: No results found.  Recent Labs: Lab Results  Component Value Date   WBC 4.5 05/07/2024   HGB 12.9 05/07/2024   PLT 229 05/07/2024   NA 138 05/07/2024   K 3.9 05/07/2024   CL 99 05/07/2024   CO2 26 05/07/2024   GLUCOSE 74 05/07/2024   BUN 17 05/07/2024   CREATININE 1.01 (H) 05/07/2024   BILITOT 0.4 05/07/2024   ALKPHOS 60 05/07/2024  AST 19 05/07/2024   ALT 8 05/07/2024   PROT 7.3 05/07/2024   ALBUMIN 3.9 05/07/2024   CALCIUM 9.6 05/07/2024   GFRAA 37 (L) 09/17/2020   QFTBGOLDPLUS Negative 05/25/2023    Speciality Comments: TB Gold: 03/25/17 Neg Forteo  07/18-10/20 Reclast  November 28, 2012, June 06, 2015, July 06, 2016, June 29, 2019, August 05, 2020  Procedures:  No procedures performed Allergies: Patient has no known allergies.    Assessment / Plan:     Visit Diagnoses: Seropositive rheumatoid arthritis of multiple sites (HCC) - +RF,+anti CCP: She has no synovitis on examination today.  She had a flare about 2 weeks ago involving both hands and both wrists.  She was experiencing significant swelling as well as difficulty using her hands for 4 to 5 days.  She took 1 dose of Tylenol  and attributes the recent flare to being 1 week overdue for Enbrel .  When she takes Enbrel  consistently her symptoms have been well-controlled.  The inflammation in her hands and wrist has subsided but she continues to have some soreness in both shoulders.  She would like to remain on Enbrel  as monotherapy.  She was advised to notify us  if she starts to have more frequent flares. She will follow-up in the office in 5 months or sooner if needed.  High risk medication use - Enbrel  Sureclick 50 mg sq  every 7 days.TB gold negative on 05/25/23. - TB gold negative on 05/25/23.  A future order for TB gold placed today for [facility].  Patient plans on having updated TB gold drawn in October. Lipid panel drawn on 09/12/2023. CBC and CMP updated on 05/07/24.  Her next lab work will be due in December and every 3 months to monitor for toxicity. No recent or recurrent infections.  Discussed the importance of holding enbrel  if she develops signs or symptoms of an infection and to resume once the infection has completely cleared.   Plan: QuantiFERON-TB Gold Plus, QuantiFERON-TB Gold Plus  Screening for tuberculosis - Future order for TB gold released.  Plan: QuantiFERON-TB Gold Plus, QuantiFERON-TB Gold Plus  Age-related osteoporosis without current pathological fracture:  Previous DEXA 07/06/23: T score -3.1, BMD 0.50 7,1/3 left left distal radius.  3% improvement from 2020. Vitamin D  WNL-89.3 on 05/25/23.  DEXA updated on 08/26/23: 1/3 left distal radius BMD 0.516 T-score -3.0.  all scanned sites are stable.   She remains on Prolia  60 mg subcutaneous injections every 6 months.  Her most recent prolia  dose was administered today on 12/12/23.    Vitamin D  deficiency: She is taking vitamin D  2000 units daily.   Medication monitoring encounter: Calcium 9.6, creatinine 1.01, GFR 55 on 05/07/2024.  Subdeltoid bursitis of right shoulder joint: She continues to experience intermittent discomfort and stiffness in both shoulders.    Trochanteric bursitis, right hip: Not currently symptomatic.   History of total knee replacement, bilateral: Doing well.  No warmth or effusion noted.   Other medical conditions are listed as follows:   History of hypertension: Blood pressure was 138/79.  History of gastroesophageal reflux (GERD)  History of COPD   Orders: Orders Placed This Encounter  Procedures   QuantiFERON-TB Gold Plus   QuantiFERON-TB Gold Plus   No orders of the defined types were placed in  this encounter.   Follow-Up Instructions: Return in about 5 months (around 10/14/2024) for Rheumatoid arthritis, Osteoporosis.   Waddell CHRISTELLA Craze, PA-C  Note - This record has been created using Dragon software.  Chart creation  errors have been sought, but may not always  have been located. Such creation errors do not reflect on  the standard of medical care.

## 2024-05-07 ENCOUNTER — Other Ambulatory Visit: Payer: Self-pay

## 2024-05-07 DIAGNOSIS — Z79899 Other long term (current) drug therapy: Secondary | ICD-10-CM

## 2024-05-07 DIAGNOSIS — M0579 Rheumatoid arthritis with rheumatoid factor of multiple sites without organ or systems involvement: Secondary | ICD-10-CM

## 2024-05-07 NOTE — Addendum Note (Signed)
 Addended by: Rayla Pember M on: 05/07/2024 08:41 AM   Modules accepted: Orders

## 2024-05-07 NOTE — Progress Notes (Signed)
 Patient called to have labs released.

## 2024-05-08 ENCOUNTER — Ambulatory Visit: Payer: Self-pay | Admitting: Physician Assistant

## 2024-05-08 ENCOUNTER — Other Ambulatory Visit: Payer: Self-pay

## 2024-05-08 DIAGNOSIS — M0579 Rheumatoid arthritis with rheumatoid factor of multiple sites without organ or systems involvement: Secondary | ICD-10-CM

## 2024-05-08 LAB — CBC WITH DIFFERENTIAL/PLATELET
Basophils Absolute: 0 x10E3/uL (ref 0.0–0.2)
Basos: 0 %
EOS (ABSOLUTE): 0.3 x10E3/uL (ref 0.0–0.4)
Eos: 6 %
Hematocrit: 38.5 % (ref 34.0–46.6)
Hemoglobin: 12.9 g/dL (ref 11.1–15.9)
Immature Grans (Abs): 0 x10E3/uL (ref 0.0–0.1)
Immature Granulocytes: 0 %
Lymphocytes Absolute: 2.1 x10E3/uL (ref 0.7–3.1)
Lymphs: 47 %
MCH: 32.3 pg (ref 26.6–33.0)
MCHC: 33.5 g/dL (ref 31.5–35.7)
MCV: 96 fL (ref 79–97)
Monocytes Absolute: 0.4 x10E3/uL (ref 0.1–0.9)
Monocytes: 8 %
Neutrophils Absolute: 1.7 x10E3/uL (ref 1.4–7.0)
Neutrophils: 39 %
Platelets: 229 x10E3/uL (ref 150–450)
RBC: 4 x10E6/uL (ref 3.77–5.28)
RDW: 11.9 % (ref 11.7–15.4)
WBC: 4.5 x10E3/uL (ref 3.4–10.8)

## 2024-05-08 LAB — CMP14+EGFR
ALT: 8 IU/L (ref 0–32)
AST: 19 IU/L (ref 0–40)
Albumin: 3.9 g/dL (ref 3.7–4.7)
Alkaline Phosphatase: 60 IU/L (ref 48–129)
BUN/Creatinine Ratio: 17 (ref 12–28)
BUN: 17 mg/dL (ref 8–27)
Bilirubin Total: 0.4 mg/dL (ref 0.0–1.2)
CO2: 26 mmol/L (ref 20–29)
Calcium: 9.6 mg/dL (ref 8.7–10.3)
Chloride: 99 mmol/L (ref 96–106)
Creatinine, Ser: 1.01 mg/dL — ABNORMAL HIGH (ref 0.57–1.00)
Globulin, Total: 3.4 g/dL (ref 1.5–4.5)
Glucose: 74 mg/dL (ref 70–99)
Potassium: 3.9 mmol/L (ref 3.5–5.2)
Sodium: 138 mmol/L (ref 134–144)
Total Protein: 7.3 g/dL (ref 6.0–8.5)
eGFR: 55 mL/min/1.73 — ABNORMAL LOW (ref >59)

## 2024-05-08 MED ORDER — ENBREL SURECLICK 50 MG/ML ~~LOC~~ SOAJ
50.0000 mg | SUBCUTANEOUS | 0 refills | Status: AC
Start: 2024-05-08 — End: ?

## 2024-05-08 NOTE — Telephone Encounter (Signed)
 Patient contacted the office to request a refill of Enbrel  be sent to [facility].  Last Fill: 12/12/2023  Labs: 05/07/2024 CBC WNL Creatinine remains borderline elevated but has improved-1.01 and GFR is low but has improved-55. Rest of CMP WNL.  Avoid the use of NSAIDs.   TB Gold: 05/25/2023 TB gold negative   Next Visit: 05/14/2024  Last Visit: 12/12/2023  IK:Dzmnendpupcz rheumatoid arthritis of multiple sites [facility])   Current Dose per office note 12/12/2023: Enbrel  Sureclick 50 mg sq every 7 days.   Okay to refill Enbrel ?

## 2024-05-08 NOTE — Progress Notes (Signed)
 CBC WNL Creatinine remains borderline elevated but has improved-1.01 and GFR is low but has improved-55. Rest of CMP WNL.  Avoid the use of NSAIDs.

## 2024-05-09 ENCOUNTER — Telehealth (HOSPITAL_COMMUNITY): Payer: Self-pay | Admitting: Pharmacy Technician

## 2024-05-09 ENCOUNTER — Other Ambulatory Visit: Payer: Self-pay | Admitting: Pharmacist

## 2024-05-09 NOTE — Telephone Encounter (Signed)
 Auth Submission: APPROVED Site of care: [facility] Payer: [employer] MEDICARE, AARP SUPP Medication & CPT/J Code(s) submitted: Prolia  (Denosumab ) [id number] Diagnosis Code: M81.0 Route of submission (phone, fax, portal):  Phone # Fax # Auth type: Buy/Bill HB Units/visits requested: 60mg  x 2 doses, q 6 months Reference number: [id number]  Approval from: 08/24/23 to 08/22/24  PA auto extended from previous auth submitted by [facility], RPH at clinic.     Demitria Hay, CPhT [facility] Phone: [phone] 05/09/2024

## 2024-05-09 NOTE — Progress Notes (Signed)
 Next Prolia  SQ due on 06/09/2024. Diagnosis: age-related osteoporosis  Dose: 60 mg SQ every 6 months  Last Clinic Visit: 12/12/2023 Next Clinic Visit: 05/14/2024  Last Prolia  dose: 12/12/2023  Labs: CBC and CMP on 05/07/2024 Last DEXA: 08/26/2023 Next DEXA due: January 2027  Orders placed for Prolia  x 1 dose. No premedications required.   Infusion center will call to schedule once benefits have been processed. Will follow-up to ensured scheduled and completed  Tailor Westfall, PharmD, MPH, BCPS, CPP Clinical Pharmacist [facility])

## 2024-05-11 ENCOUNTER — Telehealth: Payer: Self-pay

## 2024-05-11 NOTE — Telephone Encounter (Signed)
 Patient called stating that she called [employer] to set up Enbrel  shipment and was advised they did not receive new prescription. Rx was sent to [facility] on 05/08/2024. I called [facility] and confirmed they received rx on 05/08/2024. They are working on rx now and sending over to the [employer]. I was advised to have the patient call in 2 hours to arrange shipment. I called patient and advised.

## 2024-05-14 ENCOUNTER — Encounter: Payer: Self-pay | Admitting: Physician Assistant

## 2024-05-14 ENCOUNTER — Ambulatory Visit: Attending: Physician Assistant | Admitting: Physician Assistant

## 2024-05-14 VITALS — BP 138/79 | HR 54 | Temp 97.7°F | Resp 14 | Ht 62.0 in | Wt 155.6 lb

## 2024-05-14 DIAGNOSIS — M0579 Rheumatoid arthritis with rheumatoid factor of multiple sites without organ or systems involvement: Secondary | ICD-10-CM

## 2024-05-14 DIAGNOSIS — E559 Vitamin D deficiency, unspecified: Secondary | ICD-10-CM | POA: Diagnosis not present

## 2024-05-14 DIAGNOSIS — Z8719 Personal history of other diseases of the digestive system: Secondary | ICD-10-CM

## 2024-05-14 DIAGNOSIS — M810 Age-related osteoporosis without current pathological fracture: Secondary | ICD-10-CM | POA: Diagnosis not present

## 2024-05-14 DIAGNOSIS — M7551 Bursitis of right shoulder: Secondary | ICD-10-CM

## 2024-05-14 DIAGNOSIS — M7061 Trochanteric bursitis, right hip: Secondary | ICD-10-CM | POA: Diagnosis not present

## 2024-05-14 DIAGNOSIS — Z5181 Encounter for therapeutic drug level monitoring: Secondary | ICD-10-CM | POA: Diagnosis not present

## 2024-05-14 DIAGNOSIS — Z8709 Personal history of other diseases of the respiratory system: Secondary | ICD-10-CM

## 2024-05-14 DIAGNOSIS — Z8679 Personal history of other diseases of the circulatory system: Secondary | ICD-10-CM

## 2024-05-14 DIAGNOSIS — Z79899 Other long term (current) drug therapy: Secondary | ICD-10-CM

## 2024-05-14 DIAGNOSIS — Z111 Encounter for screening for respiratory tuberculosis: Secondary | ICD-10-CM

## 2024-05-14 DIAGNOSIS — Z96653 Presence of artificial knee joint, bilateral: Secondary | ICD-10-CM | POA: Diagnosis not present

## 2024-05-14 NOTE — Patient Instructions (Signed)

## 2024-06-04 NOTE — Progress Notes (Signed)
 ATC patient as a reminder to schedule Prolia . Unable to reach. Left VM advising her to call and schedule Prolia  on or after 06/09/2024. Phone is [phone]  Sherry Pennant, PharmD, MPH, BCPS, CPP Clinical Pharmacist [facility])

## 2024-06-07 ENCOUNTER — Encounter: Payer: Self-pay | Admitting: Pharmacist

## 2024-06-21 ENCOUNTER — Ambulatory Visit (HOSPITAL_COMMUNITY)
Admission: RE | Admit: 2024-06-21 | Discharge: 2024-06-21 | Disposition: A | Discharge status: Home / Self Care | Source: Ambulatory Visit | Attending: Rheumatology | Admitting: Rheumatology

## 2024-06-21 VITALS — BP 141/68 | HR 74 | Temp 97.6°F | Resp 16

## 2024-06-21 DIAGNOSIS — M810 Age-related osteoporosis without current pathological fracture: Secondary | ICD-10-CM | POA: Insufficient documentation

## 2024-06-21 MED ORDER — DENOSUMAB 60 MG/ML ~~LOC~~ SOSY
PREFILLED_SYRINGE | SUBCUTANEOUS | Status: AC
  Filled 2024-06-21: qty 1

## 2024-06-21 MED ORDER — DENOSUMAB 60 MG/ML ~~LOC~~ SOSY
60.0000 mg | PREFILLED_SYRINGE | Freq: Once | SUBCUTANEOUS | Status: AC
  Administered 2024-06-21: 60 mg via SUBCUTANEOUS

## 2024-06-22 ENCOUNTER — Telehealth: Payer: Self-pay | Admitting: Pharmacist

## 2024-06-22 NOTE — Telephone Encounter (Signed)
 Received notification from [employer] regarding a prior authorization for PROLIA  [phone]). Authorization has been APPROVED from 08/23/2024 to 08/22/2025. Approval letter sent to scan center.  Authorization # [id number] Phone # [phone]  Sherry Pennant, PharmD, MPH, BCPS, CPP Clinical Pharmacist [facility])

## 2024-07-16 ENCOUNTER — Telehealth: Payer: Self-pay

## 2024-07-16 NOTE — Telephone Encounter (Signed)
 Patient due for Enbrel  PAP renewal throuhg [employer]. Application mailed to her home with letter to return to office.   Prescriber form placed in Dr. Jammie folder for signature   Sherry Pennant, PharmD, MPH, BCPS, CPP Clinical Pharmacist [facility])

## 2024-07-17 NOTE — Telephone Encounter (Signed)
 Received signed provider from from Dr. Dolphus  Submitted a Prior Authorization request to [employer] for ENBREL  via CoverMyMeds. Will update once we receive a response.  Key: [id number]

## 2024-07-17 NOTE — Telephone Encounter (Addendum)
 Received notification from [employer] regarding a prior authorization for ENBREL . Authorization has been APPROVED from 08/24/2023 to 08/22/2025. Approval letter sent to scan center.   Authorization # [id number]  Placed w provider forms in PAP pending info folder with provider forms. Submission pending return of pt forms

## 2024-07-18 DIAGNOSIS — Z23 Encounter for immunization: Secondary | ICD-10-CM | POA: Diagnosis not present

## 2024-07-23 DIAGNOSIS — E785 Hyperlipidemia, unspecified: Secondary | ICD-10-CM | POA: Diagnosis not present

## 2024-07-23 DIAGNOSIS — R7303 Prediabetes: Secondary | ICD-10-CM | POA: Diagnosis not present

## 2024-07-27 ENCOUNTER — Other Ambulatory Visit: Payer: Self-pay | Admitting: Pharmacist

## 2024-07-27 NOTE — Progress Notes (Signed)
 Prolia  orders changed to [facility]. Patient is due for next dose on 12/18/24   Aleaha Fickling, PharmD, MPH, BCPS, CPP Clinical Pharmacist [facility])

## 2024-07-30 ENCOUNTER — Other Ambulatory Visit: Payer: Self-pay | Admitting: Family Medicine

## 2024-07-30 DIAGNOSIS — Z1231 Encounter for screening mammogram for malignant neoplasm of breast: Secondary | ICD-10-CM

## 2024-08-01 ENCOUNTER — Inpatient Hospital Stay: Admission: RE | Admit: 2024-08-01 | Discharge: 2024-08-01 | Attending: Family Medicine | Admitting: Family Medicine

## 2024-08-01 ENCOUNTER — Telehealth: Payer: Self-pay

## 2024-08-01 DIAGNOSIS — Z1231 Encounter for screening mammogram for malignant neoplasm of breast: Secondary | ICD-10-CM

## 2024-08-01 NOTE — Telephone Encounter (Signed)
 Auth Submission: APPROVED Site of care: Site of care: [facility] Payer: [employer] medicare Medication & CPT/J Code(s) submitted: Prolia  (Denosumab ) [id number] Diagnosis Code:  Route of submission (phone, fax, portal): portal Phone # Fax # Auth type: Buy/Bill PB Units/visits requested: 60mg , q6months x 2 doses Reference number: [id number] Approval from: [id number] to 08/22/25

## 2024-08-02 ENCOUNTER — Other Ambulatory Visit: Payer: Self-pay

## 2024-10-15 ENCOUNTER — Ambulatory Visit: Admitting: Physician Assistant

## 2024-12-21 ENCOUNTER — Encounter (HOSPITAL_COMMUNITY)

## 2024-12-21 ENCOUNTER — Ambulatory Visit
